# Patient Record
Sex: Female | Born: 1940 | Race: White | Hispanic: No | Marital: Married | Smoking: Never smoker
Health system: Southern US, Community
[De-identification: ages and names within clinical notes are randomized; demographics above are authoritative.]

## PROBLEM LIST (undated history)

## (undated) DIAGNOSIS — F329 Major depressive disorder, single episode, unspecified: Secondary | ICD-10-CM

## (undated) DIAGNOSIS — F32A Depression, unspecified: Secondary | ICD-10-CM

## (undated) DIAGNOSIS — E7800 Pure hypercholesterolemia, unspecified: Secondary | ICD-10-CM

## (undated) DIAGNOSIS — I10 Essential (primary) hypertension: Secondary | ICD-10-CM

## (undated) HISTORY — PX: SALPINGOOPHORECTOMY: SHX82

## (undated) HISTORY — PX: TONSILLECTOMY: SUR1361

## (undated) HISTORY — PX: CERVICAL LAMINECTOMY: SHX94

## (undated) HISTORY — PX: ABDOMINAL HYSTERECTOMY: SHX81

## (undated) HISTORY — PX: KIDNEY STONE SURGERY: SHX686

---

## 1997-11-16 ENCOUNTER — Other Ambulatory Visit: Admission: RE | Admit: 1997-11-16 | Discharge: 1997-11-16 | Payer: Self-pay | Admitting: Obstetrics and Gynecology

## 1998-12-23 ENCOUNTER — Ambulatory Visit (HOSPITAL_COMMUNITY): Admission: RE | Admit: 1998-12-23 | Discharge: 1998-12-23 | Payer: Self-pay | Admitting: Obstetrics and Gynecology

## 1998-12-23 ENCOUNTER — Encounter: Payer: Self-pay | Admitting: Obstetrics and Gynecology

## 1999-01-11 ENCOUNTER — Other Ambulatory Visit: Admission: RE | Admit: 1999-01-11 | Discharge: 1999-01-11 | Payer: Self-pay | Admitting: Obstetrics and Gynecology

## 2000-06-21 ENCOUNTER — Other Ambulatory Visit: Admission: RE | Admit: 2000-06-21 | Discharge: 2000-06-21 | Payer: Self-pay | Admitting: Obstetrics and Gynecology

## 2002-02-27 ENCOUNTER — Ambulatory Visit (HOSPITAL_COMMUNITY): Admission: RE | Admit: 2002-02-27 | Discharge: 2002-02-27 | Payer: Self-pay | Admitting: Obstetrics and Gynecology

## 2002-02-27 ENCOUNTER — Encounter: Payer: Self-pay | Admitting: Obstetrics and Gynecology

## 2003-10-01 ENCOUNTER — Ambulatory Visit (HOSPITAL_COMMUNITY): Admission: RE | Admit: 2003-10-01 | Discharge: 2003-10-01 | Payer: Self-pay | Admitting: Obstetrics and Gynecology

## 2004-11-06 ENCOUNTER — Encounter: Admission: RE | Admit: 2004-11-06 | Discharge: 2004-11-06 | Payer: Self-pay | Admitting: Orthopedic Surgery

## 2007-05-22 ENCOUNTER — Ambulatory Visit (HOSPITAL_COMMUNITY): Admission: RE | Admit: 2007-05-22 | Discharge: 2007-05-22 | Payer: Self-pay | Admitting: Internal Medicine

## 2008-10-26 ENCOUNTER — Ambulatory Visit (HOSPITAL_COMMUNITY): Admission: RE | Admit: 2008-10-26 | Discharge: 2008-10-26 | Payer: Self-pay | Admitting: Internal Medicine

## 2011-03-15 ENCOUNTER — Other Ambulatory Visit (HOSPITAL_COMMUNITY): Payer: Self-pay | Admitting: Internal Medicine

## 2011-03-15 DIAGNOSIS — Z1231 Encounter for screening mammogram for malignant neoplasm of breast: Secondary | ICD-10-CM

## 2011-03-16 ENCOUNTER — Ambulatory Visit (HOSPITAL_COMMUNITY)
Admission: RE | Admit: 2011-03-16 | Discharge: 2011-03-16 | Disposition: A | Discharge status: Home / Self Care | Payer: MEDICARE | Source: Ambulatory Visit | Attending: Internal Medicine | Admitting: Internal Medicine

## 2011-03-16 DIAGNOSIS — Z1231 Encounter for screening mammogram for malignant neoplasm of breast: Secondary | ICD-10-CM | POA: Insufficient documentation

## 2011-12-19 ENCOUNTER — Encounter: Payer: Self-pay | Admitting: Internal Medicine

## 2012-03-08 ENCOUNTER — Encounter (HOSPITAL_BASED_OUTPATIENT_CLINIC_OR_DEPARTMENT_OTHER): Payer: Self-pay | Admitting: *Deleted

## 2012-03-08 ENCOUNTER — Emergency Department (HOSPITAL_BASED_OUTPATIENT_CLINIC_OR_DEPARTMENT_OTHER): Payer: MEDICARE

## 2012-03-08 ENCOUNTER — Emergency Department (HOSPITAL_BASED_OUTPATIENT_CLINIC_OR_DEPARTMENT_OTHER)
Admission: EM | Admit: 2012-03-08 | Discharge: 2012-03-08 | Disposition: A | Discharge status: Home / Self Care | Payer: MEDICARE | Attending: Emergency Medicine | Admitting: Emergency Medicine

## 2012-03-08 DIAGNOSIS — E7800 Pure hypercholesterolemia, unspecified: Secondary | ICD-10-CM | POA: Insufficient documentation

## 2012-03-08 DIAGNOSIS — F3289 Other specified depressive episodes: Secondary | ICD-10-CM | POA: Insufficient documentation

## 2012-03-08 DIAGNOSIS — F329 Major depressive disorder, single episode, unspecified: Secondary | ICD-10-CM | POA: Insufficient documentation

## 2012-03-08 DIAGNOSIS — I10 Essential (primary) hypertension: Secondary | ICD-10-CM | POA: Insufficient documentation

## 2012-03-08 DIAGNOSIS — IMO0001 Reserved for inherently not codable concepts without codable children: Secondary | ICD-10-CM | POA: Insufficient documentation

## 2012-03-08 DIAGNOSIS — Z79899 Other long term (current) drug therapy: Secondary | ICD-10-CM | POA: Insufficient documentation

## 2012-03-08 DIAGNOSIS — M7660 Achilles tendinitis, unspecified leg: Secondary | ICD-10-CM | POA: Insufficient documentation

## 2012-03-08 HISTORY — DX: Essential (primary) hypertension: I10

## 2012-03-08 HISTORY — DX: Major depressive disorder, single episode, unspecified: F32.9

## 2012-03-08 HISTORY — DX: Depression, unspecified: F32.A

## 2012-03-08 HISTORY — DX: Pure hypercholesterolemia, unspecified: E78.00

## 2012-03-08 MED ORDER — HYDROCODONE-ACETAMINOPHEN 5-325 MG PO TABS: 2.0000 | ORAL_TABLET | ORAL | Status: AC | PRN

## 2012-03-08 NOTE — ED Notes (Signed)
Patient with left foot pain since Wednesday.  Denies any injury to the foot.  Pain is located on her left heel area and radiates to her ankle and above the ankle.

## 2012-03-08 NOTE — ED Provider Notes (Signed)
History     CSN: [id number]  Arrival date & time 03/08/12  1137   First MD Initiated Contact with Patient 03/08/12 1237      Chief Complaint  Patient presents with  . Foot Pain    (Consider location/radiation/quality/duration/timing/severity/associated sxs/prior treatment) Patient is a 71 y.o. female presenting with ankle pain. The history is provided by the patient. No language interpreter was used.  Ankle Pain  Incident onset: 3 days. There was no injury mechanism. The pain is present in the left ankle (achilles area). The quality of the pain is described as aching. The pain is at a severity of 0/10. The pain is moderate. The pain has been constant since onset. She reports no foreign bodies present. She has tried nothing for the symptoms.  Pt complains of pain to her left ankle.  Pt has been doing a lot of walking recently and cleaned up leaves in the yard  Past Medical History  Diagnosis Date  . Hypercholesteremia   . Hypertension   . Depression     History reviewed. No pertinent past surgical history.  History reviewed. No pertinent family history.  History  Substance Use Topics  . Smoking status: Never Smoker   . Smokeless tobacco: Not on file  . Alcohol Use: No    OB History    Grav Para Term Preterm Abortions TAB SAB Ect Mult Living                  Review of Systems  Musculoskeletal: Positive for myalgias. Negative for joint swelling.  Skin: Negative for wound.  All other systems reviewed and are negative.    Allergies  Review of patient's allergies indicates no known allergies.  Home Medications   Current Outpatient Rx  Name  Route  Sig  Dispense  Refill  . HYDROCHLOROTHIAZIDE 25 MG PO TABS   Oral   Take 25 mg by mouth daily.         Marland Kitchen LEVOTHYROXINE SODIUM 100 MCG PO TABS   Oral   Take 100 mcg by mouth daily.         . SERTRALINE HCL 50 MG PO TABS   Oral   Take 50 mg by mouth daily.         Marland Kitchen SIMVASTATIN 40 MG PO TABS   Oral  Take 40 mg by mouth every evening.           BP 136/71  Pulse 76  Temp 98.5 F (36.9 C) (Oral)  Resp 18  Ht 5\' 3"  (1.6 m)  Wt 172 lb (78.019 kg)  BMI 30.47 kg/m2  SpO2 98%  Physical Exam  Nursing note and vitals reviewed. Constitutional: She is oriented to person, place, and time. She appears well-developed and well-nourished.  HENT:  Head: Normocephalic and atraumatic.  Eyes: Pupils are equal, round, and reactive to light.  Cardiovascular: Normal rate.   Musculoskeletal: She exhibits tenderness.  Neurological: She is alert and oriented to person, place, and time. She has normal reflexes.  Skin: Skin is warm.  Psychiatric: She has a normal mood and affect.    ED Course  Procedures (including critical care time)  Labs Reviewed - No data to display No results found.   1. Achilles tendonitis     No results found for this or any previous visit. Dg Ankle Complete Left  03/08/2012  *RADIOLOGY REPORT*  Clinical Data: Left ankle pain for 3 days  LEFT ANKLE COMPLETE - 3+ VIEW  Comparison: None.  Findings:  Three views of the left ankle submitted.  No acute fracture or subluxation.  Mild soft tissue swelling adjacent to lateral malleolus.  Minimal posterior spurring of the calcaneus. The ankle mortise is preserved.  IMPRESSION: No acute fracture or subluxation.   Original Report Authenticated By: [name], M.D.      MDM   Pt placed in an aso. Pt advised tylenol  Elevate foot,  Decrease activitiy  Pt advised to follow up with Dr. Pearletha Forge for recheck if pain persist past one week.       Aimee Brown [place], [place] 03/08/12 1317  Aimee Brown [place], [place] 03/08/12 1326  Aimee Brown [place], [place] 03/08/12 1327

## 2012-03-08 NOTE — ED Provider Notes (Signed)
Medical screening examination/treatment/procedure(s) were conducted as a shared visit with non-physician practitioner(s) and myself.  I personally evaluated the patient during the encounter  Doug Sou, MD 03/08/12 1700

## 2012-03-08 NOTE — ED Provider Notes (Signed)
Complains of left foot pain and pain overlying left Achilles tendon onset 3 days ago. On exam patient alert nontoxic left lower extremity no redness or swelling negative Thompson's test DP pulse 2+ tender overlying the Achilles tendon  Doug Sou, MD 03/08/12 1343

## 2012-10-15 ENCOUNTER — Encounter (INDEPENDENT_AMBULATORY_CARE_PROVIDER_SITE_OTHER): Payer: MEDICARE | Admitting: *Deleted

## 2012-10-15 DIAGNOSIS — I6529 Occlusion and stenosis of unspecified carotid artery: Secondary | ICD-10-CM

## 2012-10-15 DIAGNOSIS — G458 Other transient cerebral ischemic attacks and related syndromes: Secondary | ICD-10-CM

## 2012-11-07 ENCOUNTER — Telehealth: Payer: Self-pay | Admitting: Diagnostic Neuroimaging

## 2012-11-11 ENCOUNTER — Encounter (INDEPENDENT_AMBULATORY_CARE_PROVIDER_SITE_OTHER): Payer: Self-pay | Admitting: Diagnostic Neuroimaging

## 2012-11-11 ENCOUNTER — Ambulatory Visit (INDEPENDENT_AMBULATORY_CARE_PROVIDER_SITE_OTHER): Payer: Medicare Other

## 2012-11-11 DIAGNOSIS — R209 Unspecified disturbances of skin sensation: Secondary | ICD-10-CM

## 2012-11-11 DIAGNOSIS — Z0289 Encounter for other administrative examinations: Secondary | ICD-10-CM

## 2012-11-21 NOTE — Procedures (Signed)
   [facility]  NCS (NERVE CONDUCTION STUDY) WITH EMG (ELECTROMYOGRAPHY) REPORT   STUDY DATE: 11/11/12 PATIENT NAME: Aimee Brown DOB: 1941/02/13 MRN: [id number]  ORDERING CLINICIAN: Jacky Kindle  TECHNOLOGIST: Gearldine Shown ELECTROMYOGRAPHER: Glenford Bayley. Penumalli, MD  CLINICAL INFORMATION: 72 year old female with numbness and tingling in the left arm x 2 years. Symptoms radiate from left shoulder to the left hand and fingers. Symptoms are intermittent, but worse with exertion. Patient denies any neck pain.  FINDINGS: NERVE CONDUCTION STUDY: Bilateral median and ulnar motor responses have normal distal latencies, amplitudes, conduction velocities and F-wave latencies. Bilateral median and ulnar sensory responses are normal.  NEEDLE ELECTROMYOGRAPHY: Needle examination of selected muscles of left upper extremity (deltoid, biceps, triceps, extensor indicis proprius, brachial radialis, flexor carpi radialis, first dorsal interosseous) shows no abnormal spontaneous activity at rest and decreased motor unit recruitment in the left triceps muscle on exertion. Examination of left C5-6, C6-7, C7-T1 paraspinal muscles shows rare complex repetitive discharges and fibrillation potentials in the left C6-7 region.  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 No evidence of brachial plexopathy or neuropathy at this time.   INTERPRETING PHYSICIAN:  Suanne Marker, MD Certified in Neurology, Neurophysiology and Neuroimaging  [facility] [address] [place], [place] [address] [phone]

## 2013-02-23 ENCOUNTER — Other Ambulatory Visit (HOSPITAL_COMMUNITY): Payer: Self-pay | Admitting: Internal Medicine

## 2013-02-23 DIAGNOSIS — Z1231 Encounter for screening mammogram for malignant neoplasm of breast: Secondary | ICD-10-CM

## 2013-03-10 ENCOUNTER — Ambulatory Visit (HOSPITAL_COMMUNITY)
Admission: RE | Admit: 2013-03-10 | Discharge: 2013-03-10 | Disposition: A | Discharge status: Home / Self Care | Payer: MEDICARE | Source: Ambulatory Visit | Attending: Internal Medicine | Admitting: Internal Medicine

## 2013-03-10 DIAGNOSIS — Z1231 Encounter for screening mammogram for malignant neoplasm of breast: Secondary | ICD-10-CM | POA: Insufficient documentation

## 2013-07-23 NOTE — Telephone Encounter (Signed)
Closing encounter

## 2015-02-08 ENCOUNTER — Other Ambulatory Visit: Payer: Self-pay

## 2015-02-08 DIAGNOSIS — Z1231 Encounter for screening mammogram for malignant neoplasm of breast: Secondary | ICD-10-CM

## 2015-03-02 ENCOUNTER — Ambulatory Visit
Admission: RE | Admit: 2015-03-02 | Discharge: 2015-03-02 | Disposition: A | Discharge status: Home / Self Care | Payer: Medicare Other | Source: Ambulatory Visit

## 2015-03-02 DIAGNOSIS — Z1231 Encounter for screening mammogram for malignant neoplasm of breast: Secondary | ICD-10-CM

## 2016-04-03 DIAGNOSIS — H52202 Unspecified astigmatism, left eye: Secondary | ICD-10-CM | POA: Diagnosis not present

## 2016-04-03 DIAGNOSIS — H26491 Other secondary cataract, right eye: Secondary | ICD-10-CM | POA: Diagnosis not present

## 2016-04-03 DIAGNOSIS — H5202 Hypermetropia, left eye: Secondary | ICD-10-CM | POA: Diagnosis not present

## 2016-04-03 DIAGNOSIS — L570 Actinic keratosis: Secondary | ICD-10-CM | POA: Diagnosis not present

## 2016-04-03 DIAGNOSIS — Z961 Presence of intraocular lens: Secondary | ICD-10-CM | POA: Diagnosis not present

## 2016-04-03 DIAGNOSIS — H524 Presbyopia: Secondary | ICD-10-CM | POA: Diagnosis not present

## 2016-04-03 DIAGNOSIS — C44729 Squamous cell carcinoma of skin of left lower limb, including hip: Secondary | ICD-10-CM | POA: Diagnosis not present

## 2016-04-17 ENCOUNTER — Other Ambulatory Visit: Payer: Self-pay | Admitting: Internal Medicine

## 2016-04-17 DIAGNOSIS — Z1231 Encounter for screening mammogram for malignant neoplasm of breast: Secondary | ICD-10-CM

## 2016-04-23 DIAGNOSIS — C44729 Squamous cell carcinoma of skin of left lower limb, including hip: Secondary | ICD-10-CM | POA: Diagnosis not present

## 2016-04-26 DIAGNOSIS — I10 Essential (primary) hypertension: Secondary | ICD-10-CM | POA: Diagnosis not present

## 2016-04-26 DIAGNOSIS — E038 Other specified hypothyroidism: Secondary | ICD-10-CM | POA: Diagnosis not present

## 2016-04-26 DIAGNOSIS — R682 Dry mouth, unspecified: Secondary | ICD-10-CM | POA: Diagnosis not present

## 2016-04-26 DIAGNOSIS — Z6831 Body mass index (BMI) 31.0-31.9, adult: Secondary | ICD-10-CM | POA: Diagnosis not present

## 2016-04-30 DIAGNOSIS — Z961 Presence of intraocular lens: Secondary | ICD-10-CM | POA: Diagnosis not present

## 2016-04-30 DIAGNOSIS — H26491 Other secondary cataract, right eye: Secondary | ICD-10-CM | POA: Diagnosis not present

## 2016-05-07 DIAGNOSIS — S81802D Unspecified open wound, left lower leg, subsequent encounter: Secondary | ICD-10-CM | POA: Diagnosis not present

## 2016-05-08 DIAGNOSIS — R682 Dry mouth, unspecified: Secondary | ICD-10-CM | POA: Diagnosis not present

## 2016-05-08 DIAGNOSIS — Z9622 Myringotomy tube(s) status: Secondary | ICD-10-CM | POA: Diagnosis not present

## 2016-05-09 DIAGNOSIS — H26491 Other secondary cataract, right eye: Secondary | ICD-10-CM | POA: Diagnosis not present

## 2016-05-14 ENCOUNTER — Ambulatory Visit
Admission: RE | Admit: 2016-05-14 | Discharge: 2016-05-14 | Disposition: A | Discharge status: Home / Self Care | Payer: PPO | Source: Ambulatory Visit | Attending: Internal Medicine | Admitting: Internal Medicine

## 2016-05-14 DIAGNOSIS — Z1231 Encounter for screening mammogram for malignant neoplasm of breast: Secondary | ICD-10-CM

## 2016-05-14 DIAGNOSIS — S81802D Unspecified open wound, left lower leg, subsequent encounter: Secondary | ICD-10-CM | POA: Diagnosis not present

## 2016-05-21 DIAGNOSIS — S81802D Unspecified open wound, left lower leg, subsequent encounter: Secondary | ICD-10-CM | POA: Diagnosis not present

## 2016-05-21 DIAGNOSIS — Z4801 Encounter for change or removal of surgical wound dressing: Secondary | ICD-10-CM | POA: Diagnosis not present

## 2016-05-28 DIAGNOSIS — I10 Essential (primary) hypertension: Secondary | ICD-10-CM | POA: Diagnosis not present

## 2016-05-28 DIAGNOSIS — E784 Other hyperlipidemia: Secondary | ICD-10-CM | POA: Diagnosis not present

## 2016-05-28 DIAGNOSIS — N390 Urinary tract infection, site not specified: Secondary | ICD-10-CM | POA: Diagnosis not present

## 2016-05-28 DIAGNOSIS — C44729 Squamous cell carcinoma of skin of left lower limb, including hip: Secondary | ICD-10-CM | POA: Diagnosis not present

## 2016-05-28 DIAGNOSIS — S81802D Unspecified open wound, left lower leg, subsequent encounter: Secondary | ICD-10-CM | POA: Diagnosis not present

## 2016-05-28 DIAGNOSIS — D485 Neoplasm of uncertain behavior of skin: Secondary | ICD-10-CM | POA: Diagnosis not present

## 2016-05-28 DIAGNOSIS — R8299 Other abnormal findings in urine: Secondary | ICD-10-CM | POA: Diagnosis not present

## 2016-06-04 DIAGNOSIS — E038 Other specified hypothyroidism: Secondary | ICD-10-CM | POA: Diagnosis not present

## 2016-06-04 DIAGNOSIS — S81802D Unspecified open wound, left lower leg, subsequent encounter: Secondary | ICD-10-CM | POA: Diagnosis not present

## 2016-06-04 DIAGNOSIS — I10 Essential (primary) hypertension: Secondary | ICD-10-CM | POA: Diagnosis not present

## 2016-06-04 DIAGNOSIS — K219 Gastro-esophageal reflux disease without esophagitis: Secondary | ICD-10-CM | POA: Diagnosis not present

## 2016-06-04 DIAGNOSIS — R682 Dry mouth, unspecified: Secondary | ICD-10-CM | POA: Diagnosis not present

## 2016-06-04 DIAGNOSIS — Z6831 Body mass index (BMI) 31.0-31.9, adult: Secondary | ICD-10-CM | POA: Diagnosis not present

## 2016-06-04 DIAGNOSIS — Z1389 Encounter for screening for other disorder: Secondary | ICD-10-CM | POA: Diagnosis not present

## 2016-06-04 DIAGNOSIS — Z0000 Encounter for general adult medical examination without abnormal findings: Secondary | ICD-10-CM | POA: Diagnosis not present

## 2016-06-04 DIAGNOSIS — E784 Other hyperlipidemia: Secondary | ICD-10-CM | POA: Diagnosis not present

## 2016-06-04 DIAGNOSIS — E669 Obesity, unspecified: Secondary | ICD-10-CM | POA: Diagnosis not present

## 2016-06-04 DIAGNOSIS — M5412 Radiculopathy, cervical region: Secondary | ICD-10-CM | POA: Diagnosis not present

## 2016-06-04 DIAGNOSIS — K589 Irritable bowel syndrome without diarrhea: Secondary | ICD-10-CM | POA: Diagnosis not present

## 2016-06-04 DIAGNOSIS — M1990 Unspecified osteoarthritis, unspecified site: Secondary | ICD-10-CM | POA: Diagnosis not present

## 2016-06-11 DIAGNOSIS — S81802D Unspecified open wound, left lower leg, subsequent encounter: Secondary | ICD-10-CM | POA: Diagnosis not present

## 2016-06-12 DIAGNOSIS — E669 Obesity, unspecified: Secondary | ICD-10-CM | POA: Diagnosis not present

## 2016-06-12 DIAGNOSIS — Z6831 Body mass index (BMI) 31.0-31.9, adult: Secondary | ICD-10-CM | POA: Diagnosis not present

## 2016-06-12 DIAGNOSIS — M3500 Sicca syndrome, unspecified: Secondary | ICD-10-CM | POA: Diagnosis not present

## 2016-06-14 DIAGNOSIS — Z1212 Encounter for screening for malignant neoplasm of rectum: Secondary | ICD-10-CM | POA: Diagnosis not present

## 2016-06-20 DIAGNOSIS — L820 Inflamed seborrheic keratosis: Secondary | ICD-10-CM | POA: Diagnosis not present

## 2016-06-20 DIAGNOSIS — L814 Other melanin hyperpigmentation: Secondary | ICD-10-CM | POA: Diagnosis not present

## 2016-06-20 DIAGNOSIS — D225 Melanocytic nevi of trunk: Secondary | ICD-10-CM | POA: Diagnosis not present

## 2016-06-20 DIAGNOSIS — L578 Other skin changes due to chronic exposure to nonionizing radiation: Secondary | ICD-10-CM | POA: Diagnosis not present

## 2016-06-25 DIAGNOSIS — S81802D Unspecified open wound, left lower leg, subsequent encounter: Secondary | ICD-10-CM | POA: Diagnosis not present

## 2016-07-11 DIAGNOSIS — E669 Obesity, unspecified: Secondary | ICD-10-CM | POA: Diagnosis not present

## 2016-07-11 DIAGNOSIS — M150 Primary generalized (osteo)arthritis: Secondary | ICD-10-CM | POA: Diagnosis not present

## 2016-07-11 DIAGNOSIS — I7300 Raynaud's syndrome without gangrene: Secondary | ICD-10-CM | POA: Diagnosis not present

## 2016-07-11 DIAGNOSIS — M3500 Sicca syndrome, unspecified: Secondary | ICD-10-CM | POA: Diagnosis not present

## 2016-07-11 DIAGNOSIS — Z6830 Body mass index (BMI) 30.0-30.9, adult: Secondary | ICD-10-CM | POA: Diagnosis not present

## 2016-07-16 DIAGNOSIS — L570 Actinic keratosis: Secondary | ICD-10-CM | POA: Diagnosis not present

## 2016-07-16 DIAGNOSIS — Z08 Encounter for follow-up examination after completed treatment for malignant neoplasm: Secondary | ICD-10-CM | POA: Diagnosis not present

## 2016-08-20 DIAGNOSIS — C44729 Squamous cell carcinoma of skin of left lower limb, including hip: Secondary | ICD-10-CM | POA: Diagnosis not present

## 2016-08-20 DIAGNOSIS — Z85828 Personal history of other malignant neoplasm of skin: Secondary | ICD-10-CM | POA: Diagnosis not present

## 2016-08-20 DIAGNOSIS — L905 Scar conditions and fibrosis of skin: Secondary | ICD-10-CM | POA: Diagnosis not present

## 2016-08-20 DIAGNOSIS — L570 Actinic keratosis: Secondary | ICD-10-CM | POA: Diagnosis not present

## 2016-08-23 DIAGNOSIS — H00022 Hordeolum internum right lower eyelid: Secondary | ICD-10-CM | POA: Diagnosis not present

## 2016-09-18 ENCOUNTER — Other Ambulatory Visit: Payer: Self-pay | Admitting: *Deleted

## 2016-09-18 NOTE — Patient Outreach (Signed)
[facility]) Care Management  09/18/2016  Aimee Brown 1940/06/25 [id number]   Member identified as high risk through [place].  Call placed to member, identity verified.  She report that she does have history of hypertension, but it is controlled.  She has not been in hospital or gone to the ED in the last 6 months.  She denies any concern regarding her healthcare management at this time, declines need for assistance.  Will not place referral, will not open case.  Advised to discuss potential involvement with [facility] with primary MD in the future if needs change.  Valente David, [place], MSN [place] [phone]

## 2016-09-27 ENCOUNTER — Encounter: Payer: Self-pay | Admitting: *Deleted

## 2016-10-01 DIAGNOSIS — L905 Scar conditions and fibrosis of skin: Secondary | ICD-10-CM | POA: Diagnosis not present

## 2016-10-01 DIAGNOSIS — Z85828 Personal history of other malignant neoplasm of skin: Secondary | ICD-10-CM | POA: Diagnosis not present

## 2016-10-01 DIAGNOSIS — L570 Actinic keratosis: Secondary | ICD-10-CM | POA: Diagnosis not present

## 2016-10-29 DIAGNOSIS — J340 Abscess, furuncle and carbuncle of nose: Secondary | ICD-10-CM | POA: Diagnosis not present

## 2016-10-29 DIAGNOSIS — Z9622 Myringotomy tube(s) status: Secondary | ICD-10-CM | POA: Diagnosis not present

## 2016-10-29 DIAGNOSIS — H6522 Chronic serous otitis media, left ear: Secondary | ICD-10-CM | POA: Diagnosis not present

## 2016-11-07 DIAGNOSIS — I7300 Raynaud's syndrome without gangrene: Secondary | ICD-10-CM | POA: Diagnosis not present

## 2016-11-07 DIAGNOSIS — M150 Primary generalized (osteo)arthritis: Secondary | ICD-10-CM | POA: Diagnosis not present

## 2016-11-07 DIAGNOSIS — E669 Obesity, unspecified: Secondary | ICD-10-CM | POA: Diagnosis not present

## 2016-11-07 DIAGNOSIS — Z6830 Body mass index (BMI) 30.0-30.9, adult: Secondary | ICD-10-CM | POA: Diagnosis not present

## 2016-11-07 DIAGNOSIS — M3500 Sicca syndrome, unspecified: Secondary | ICD-10-CM | POA: Diagnosis not present

## 2016-12-10 DIAGNOSIS — M1990 Unspecified osteoarthritis, unspecified site: Secondary | ICD-10-CM | POA: Diagnosis not present

## 2016-12-10 DIAGNOSIS — E784 Other hyperlipidemia: Secondary | ICD-10-CM | POA: Diagnosis not present

## 2016-12-10 DIAGNOSIS — Z6831 Body mass index (BMI) 31.0-31.9, adult: Secondary | ICD-10-CM | POA: Diagnosis not present

## 2016-12-10 DIAGNOSIS — M3500 Sicca syndrome, unspecified: Secondary | ICD-10-CM | POA: Diagnosis not present

## 2016-12-10 DIAGNOSIS — K589 Irritable bowel syndrome without diarrhea: Secondary | ICD-10-CM | POA: Diagnosis not present

## 2016-12-10 DIAGNOSIS — M109 Gout, unspecified: Secondary | ICD-10-CM | POA: Diagnosis not present

## 2016-12-10 DIAGNOSIS — E038 Other specified hypothyroidism: Secondary | ICD-10-CM | POA: Diagnosis not present

## 2016-12-10 DIAGNOSIS — M7122 Synovial cyst of popliteal space [Baker], left knee: Secondary | ICD-10-CM | POA: Diagnosis not present

## 2016-12-10 DIAGNOSIS — E669 Obesity, unspecified: Secondary | ICD-10-CM | POA: Diagnosis not present

## 2016-12-10 DIAGNOSIS — I10 Essential (primary) hypertension: Secondary | ICD-10-CM | POA: Diagnosis not present

## 2016-12-10 DIAGNOSIS — Z23 Encounter for immunization: Secondary | ICD-10-CM | POA: Diagnosis not present

## 2016-12-10 DIAGNOSIS — M5412 Radiculopathy, cervical region: Secondary | ICD-10-CM | POA: Diagnosis not present

## 2016-12-31 DIAGNOSIS — L905 Scar conditions and fibrosis of skin: Secondary | ICD-10-CM | POA: Diagnosis not present

## 2016-12-31 DIAGNOSIS — Z85828 Personal history of other malignant neoplasm of skin: Secondary | ICD-10-CM | POA: Diagnosis not present

## 2016-12-31 DIAGNOSIS — L570 Actinic keratosis: Secondary | ICD-10-CM | POA: Diagnosis not present

## 2016-12-31 DIAGNOSIS — C44729 Squamous cell carcinoma of skin of left lower limb, including hip: Secondary | ICD-10-CM | POA: Diagnosis not present

## 2017-03-01 DIAGNOSIS — E669 Obesity, unspecified: Secondary | ICD-10-CM | POA: Diagnosis not present

## 2017-03-01 DIAGNOSIS — R591 Generalized enlarged lymph nodes: Secondary | ICD-10-CM | POA: Diagnosis not present

## 2017-03-01 DIAGNOSIS — I10 Essential (primary) hypertension: Secondary | ICD-10-CM | POA: Diagnosis not present

## 2017-03-01 DIAGNOSIS — R221 Localized swelling, mass and lump, neck: Secondary | ICD-10-CM | POA: Diagnosis not present

## 2017-03-13 DIAGNOSIS — M150 Primary generalized (osteo)arthritis: Secondary | ICD-10-CM | POA: Diagnosis not present

## 2017-03-13 DIAGNOSIS — I7300 Raynaud's syndrome without gangrene: Secondary | ICD-10-CM | POA: Diagnosis not present

## 2017-03-13 DIAGNOSIS — M3500 Sicca syndrome, unspecified: Secondary | ICD-10-CM | POA: Diagnosis not present

## 2017-03-13 DIAGNOSIS — Z6830 Body mass index (BMI) 30.0-30.9, adult: Secondary | ICD-10-CM | POA: Diagnosis not present

## 2017-03-13 DIAGNOSIS — E669 Obesity, unspecified: Secondary | ICD-10-CM | POA: Diagnosis not present

## 2017-04-09 DIAGNOSIS — Z85828 Personal history of other malignant neoplasm of skin: Secondary | ICD-10-CM | POA: Diagnosis not present

## 2017-04-09 DIAGNOSIS — L570 Actinic keratosis: Secondary | ICD-10-CM | POA: Diagnosis not present

## 2017-04-09 DIAGNOSIS — B078 Other viral warts: Secondary | ICD-10-CM | POA: Diagnosis not present

## 2017-04-09 DIAGNOSIS — L905 Scar conditions and fibrosis of skin: Secondary | ICD-10-CM | POA: Diagnosis not present

## 2017-04-09 DIAGNOSIS — B079 Viral wart, unspecified: Secondary | ICD-10-CM | POA: Diagnosis not present

## 2017-04-09 DIAGNOSIS — L820 Inflamed seborrheic keratosis: Secondary | ICD-10-CM | POA: Diagnosis not present

## 2017-04-15 DIAGNOSIS — H524 Presbyopia: Secondary | ICD-10-CM | POA: Diagnosis not present

## 2017-04-15 DIAGNOSIS — Z961 Presence of intraocular lens: Secondary | ICD-10-CM | POA: Diagnosis not present

## 2017-04-15 DIAGNOSIS — H04123 Dry eye syndrome of bilateral lacrimal glands: Secondary | ICD-10-CM | POA: Diagnosis not present

## 2017-05-01 ENCOUNTER — Other Ambulatory Visit: Payer: Self-pay | Admitting: Internal Medicine

## 2017-05-01 DIAGNOSIS — Z1231 Encounter for screening mammogram for malignant neoplasm of breast: Secondary | ICD-10-CM

## 2017-05-20 ENCOUNTER — Ambulatory Visit
Admission: RE | Admit: 2017-05-20 | Discharge: 2017-05-20 | Disposition: A | Discharge status: Home / Self Care | Payer: PPO | Source: Ambulatory Visit | Attending: Internal Medicine | Admitting: Internal Medicine

## 2017-05-20 DIAGNOSIS — Z1231 Encounter for screening mammogram for malignant neoplasm of breast: Secondary | ICD-10-CM

## 2017-06-05 DIAGNOSIS — R82998 Other abnormal findings in urine: Secondary | ICD-10-CM | POA: Diagnosis not present

## 2017-06-05 DIAGNOSIS — E038 Other specified hypothyroidism: Secondary | ICD-10-CM | POA: Diagnosis not present

## 2017-06-05 DIAGNOSIS — I10 Essential (primary) hypertension: Secondary | ICD-10-CM | POA: Diagnosis not present

## 2017-06-05 DIAGNOSIS — M109 Gout, unspecified: Secondary | ICD-10-CM | POA: Diagnosis not present

## 2017-06-12 DIAGNOSIS — I10 Essential (primary) hypertension: Secondary | ICD-10-CM | POA: Diagnosis not present

## 2017-06-12 DIAGNOSIS — Z1389 Encounter for screening for other disorder: Secondary | ICD-10-CM | POA: Diagnosis not present

## 2017-06-12 DIAGNOSIS — E7849 Other hyperlipidemia: Secondary | ICD-10-CM | POA: Diagnosis not present

## 2017-06-12 DIAGNOSIS — M3500 Sicca syndrome, unspecified: Secondary | ICD-10-CM | POA: Diagnosis not present

## 2017-06-12 DIAGNOSIS — E663 Overweight: Secondary | ICD-10-CM | POA: Diagnosis not present

## 2017-06-12 DIAGNOSIS — Z6829 Body mass index (BMI) 29.0-29.9, adult: Secondary | ICD-10-CM | POA: Diagnosis not present

## 2017-06-12 DIAGNOSIS — K589 Irritable bowel syndrome without diarrhea: Secondary | ICD-10-CM | POA: Diagnosis not present

## 2017-06-12 DIAGNOSIS — M109 Gout, unspecified: Secondary | ICD-10-CM | POA: Diagnosis not present

## 2017-06-12 DIAGNOSIS — E038 Other specified hypothyroidism: Secondary | ICD-10-CM | POA: Diagnosis not present

## 2017-06-12 DIAGNOSIS — Z0000 Encounter for general adult medical examination without abnormal findings: Secondary | ICD-10-CM | POA: Diagnosis not present

## 2017-06-12 DIAGNOSIS — R0609 Other forms of dyspnea: Secondary | ICD-10-CM | POA: Diagnosis not present

## 2017-06-19 DIAGNOSIS — R079 Chest pain, unspecified: Secondary | ICD-10-CM | POA: Diagnosis not present

## 2017-06-19 DIAGNOSIS — I119 Hypertensive heart disease without heart failure: Secondary | ICD-10-CM | POA: Diagnosis not present

## 2017-06-19 DIAGNOSIS — R0602 Shortness of breath: Secondary | ICD-10-CM | POA: Diagnosis not present

## 2017-06-19 DIAGNOSIS — E785 Hyperlipidemia, unspecified: Secondary | ICD-10-CM | POA: Diagnosis not present

## 2017-06-27 DIAGNOSIS — E039 Hypothyroidism, unspecified: Secondary | ICD-10-CM | POA: Diagnosis not present

## 2017-06-27 DIAGNOSIS — R0602 Shortness of breath: Secondary | ICD-10-CM | POA: Diagnosis not present

## 2017-06-27 DIAGNOSIS — E669 Obesity, unspecified: Secondary | ICD-10-CM | POA: Diagnosis not present

## 2017-06-27 DIAGNOSIS — E785 Hyperlipidemia, unspecified: Secondary | ICD-10-CM | POA: Diagnosis not present

## 2017-06-27 DIAGNOSIS — I119 Hypertensive heart disease without heart failure: Secondary | ICD-10-CM | POA: Diagnosis not present

## 2017-06-28 DIAGNOSIS — R0609 Other forms of dyspnea: Secondary | ICD-10-CM | POA: Diagnosis not present

## 2017-07-08 DIAGNOSIS — L570 Actinic keratosis: Secondary | ICD-10-CM | POA: Diagnosis not present

## 2017-07-08 DIAGNOSIS — Z85828 Personal history of other malignant neoplasm of skin: Secondary | ICD-10-CM | POA: Diagnosis not present

## 2017-07-08 DIAGNOSIS — L905 Scar conditions and fibrosis of skin: Secondary | ICD-10-CM | POA: Diagnosis not present

## 2017-07-09 ENCOUNTER — Other Ambulatory Visit: Payer: Self-pay | Admitting: Cardiology

## 2017-07-09 ENCOUNTER — Encounter: Payer: Self-pay | Admitting: Cardiology

## 2017-07-09 DIAGNOSIS — R0609 Other forms of dyspnea: Secondary | ICD-10-CM | POA: Diagnosis not present

## 2017-07-09 DIAGNOSIS — R0602 Shortness of breath: Secondary | ICD-10-CM

## 2017-07-09 DIAGNOSIS — E785 Hyperlipidemia, unspecified: Secondary | ICD-10-CM | POA: Diagnosis not present

## 2017-07-09 DIAGNOSIS — I119 Hypertensive heart disease without heart failure: Secondary | ICD-10-CM | POA: Insufficient documentation

## 2017-07-09 DIAGNOSIS — E7800 Pure hypercholesterolemia, unspecified: Secondary | ICD-10-CM

## 2017-07-09 DIAGNOSIS — R0600 Dyspnea, unspecified: Secondary | ICD-10-CM | POA: Insufficient documentation

## 2017-07-09 DIAGNOSIS — E669 Obesity, unspecified: Secondary | ICD-10-CM | POA: Diagnosis not present

## 2017-07-09 DIAGNOSIS — R9439 Abnormal result of other cardiovascular function study: Secondary | ICD-10-CM

## 2017-07-09 DIAGNOSIS — E039 Hypothyroidism, unspecified: Secondary | ICD-10-CM | POA: Diagnosis not present

## 2017-07-09 NOTE — H&P (View-Only) (Signed)
Aimee Brown  Date of visit:  07/09/2017 DOB:  [birth year]    Age:  77 yrs. Medical record number:  [id number]     Account number:  [id number] Primary Care Provider: Burnard Bunting A ____________________________ CURRENT DIAGNOSES  1. Dyspnea  2. Hypertensive heart disease without heart failure  3. Hyperlipidemia  4. Hypothyroidism  5. Obesity ____________________________ ALLERGIES  NKDA ____________________________ MEDICATIONS  1. Colcrys 0.6 mg tablet, 1 p.o. daily PRN  2. Imodium A-D 2 mg tablet, PRN  3. levothyroxine 112 mcg tablet, 1 p.o. daily  4. omeprazole 20 mg capsule,delayed release, 1 p.o. daily  5. ramipril 10 mg capsule, 1 p.o. daily  6. sertraline 50 mg tablet, 1 tablet p.o. daily  7. Vitamin D3 2,000 unit tablet, 1 p.o. daily ____________________________ HISTORY OF PRESENT ILLNESS This 77 year old female is brought in for elective catheterization following workup for dyspnea and an abnormal stress test.. The patient has been evaluated by me over the years for chest pain as well as dyspnea with negative stress test. She has previous evidence of diastolic dysfunction and mild LVH. She says her blood pressure is normally controlled at home but has been elevated in the office. She has noted exertional dyspnea walking short distances over the past 6 weeks. She previously had been relatively inactive but has difficulty walking up a hill or less than one quarter of a mile. Her husband noted she would stop and she will then be able to complete the walk. She may have some vague chest tightness at the time that she also walks. She denies PND, orthopnea or claudication. She has some mild edema present at the end of the day.  It is pertinent that she has had difficulty with dry mouth over the past year as well as some Raynaud's phenomena. She has mild arthritis. She reportedly had a chest x-ray last week that was unremarkable. She has seen a rheumatologist and no cause for her dry mouth  could be found. She previously had hyperlipidemia but stopped taking statins when she had the dry mouth diagnosed.  She was initially referred and had a myocardial perfusion scan on 4/11 and showed a reversible inferolateral defect of moderate size. She was only able to walk 5 minutes on the treadmill and had frequent ventricular ectopy and couplets with exercise no ST depression. Ejection fraction was 63%. Because of the abnormal myocardial perfusion scan and recent onset of symptoms is brought in for same-day elective catheterization. ____________________________ PAST HISTORY  Past Medical Illnesses:  hypertension, hypothyroidism, hyperlipidemia, obesity, irritable bowel syndrome, osteoarthritis, gout;  Cardiovascular Illnesses:  no previous history of cardiac disease.;  Surgical Procedures:  cesarean section, hysterectomy, kidney stone removal, tonsillectomy, salpingo-oophorectomy, cervical laminectomy;  NYHA Classification:  II;  Canadian Angina Classification:  Class 0: Asymptomatic;  Cardiology Procedures-Invasive:  no history of prior cardiac procedures;  Cardiology Procedures-Noninvasive:  treadmill cardiolite March 2007, echocardiogram August 2013, treadmill Myoview April 2019;  LVEF of 63% documented via nuclear study on 06/27/2017,   ____________________________ FAMILY HISTORY Brother -- Brother dead, Scleroderma Brother -- Brother dead Father -- Father dead, Pacemaker in situ, Hyperthyroidism Mother -- Mother dead, Hypothyroidism, Heart disease ____________________________ SOCIAL HISTORY Alcohol Use:  no alcohol use;  Smoking:  never smoked;  Diet:  regular diet;  Lifestyle:  married;  Exercise:  walking 4-5 days week 40 mins;  Occupation:  retired;  Residence:  lives with husband;   ____________________________ REVIEW OF SYSTEMS General:  denies recent weight change, fatique or change in exercise  tolerance.  Integumentary:no rashes or new skin lesions. Eyes: wears eye glasses/contact  lenses Ears, Nose, Throat, Mouth:  dry mouth Respiratory: dyspnea with exertion Cardiovascular:  please review HPI Abdominal: intermittent diarrheaGenitourinary-Female: no dysuria, urgency, frequency, UTIs, or stress incontinence Musculoskeletal:  arthritis of the hand, chronic low back pain, Raynaud's syndrome Neurological:  denies headaches, stroke, or TIA Psychiatric:  denies depression or anxiety Hematological/Immunologic:  denies any food allergies, bleeding disorders. ____________________________ PHYSICAL EXAMINATION VITAL SIGNS  Blood Pressure:  160/100 Sitting, Left arm, large cuff  , 148/88 Standing, Left arm and large cuff   Pulse:  100/min. Weight:  169.00 lbs. Height:  63.00"BMI: 30  Constitutional:  pleasant white female, in no acute distress, mildly obese Skin:  warm and dry to touch, no apparent skin lesions, or masses noted. Head:  normocephalic, normal hair pattern, no masses or tenderness Eyes:  EOMS Intact, PERRLA, C and S clear, Funduscopic exam not done. ENT:  ears, nose and throat reveal no gross abnormalities.  Dentition good. Neck:  supple, without massess. No JVD, thyromegaly or carotid bruits. Carotid upstroke normal. Chest:  normal symmetry, clear to auscultation. Cardiac:  regular rhythm, normal S1 and S2, No S3 or S4, no murmurs, gallops or rubs detected. Abdomen:  abdomen soft,non-tender, no masses, no hepatospenomegaly, or aneurysm noted Peripheral Pulses:  the femoral,dorsalis pedis, and posterior tibial pulses are full and equal bilaterally with no bruits auscultated. Extremities & Back:  mild no edema present, mild bilateral venous insufficiency changes present Neurological:  no gross motor or sensory deficits noted, affect appropriate, oriented x3. ____________________________ MOST RECENT LIPID PANEL 06/05/17  CHOL TOTL 286 mg/dl, LDL 188 NM, HDL 53 mg/dl, TRIGLYCER 226 mg/dl, ALT 11 u/l, ALK PHOS 80 u/l, CHOL/HDL 5.4 (Calc) and AST 19  u/l ____________________________ IMPRESSIONS/PLAN  1. New onset of dyspnea on exertion with an abnormal myocardial perfusion scan 2. Hypertensive heart disease 3. Obesity 4. Hyperlipidemia 5. Hypothyroidism  Recommendations:  Cardiac catheterization was discussed with the patient including risks of myocardial infarction, death, stroke, bleeding, arrhythmia, dye allergy, or renal insufficiency. The patient and husband understand and are willing to proceed. Possibility of percutaneous intervention at the same setting was also discussed with the patient including risks.  ____________________________ TODAYS ORDERS  1. Draw PT/INR: Today  2. Comprehensive Metabolic Panel: Today  3. Complete Blood Count: Today  4. PTT: Today                       ____________________________ Cardiology Physician:  Kerry Hough MD [facility]     Aimee Brown  Date of visit:  07/09/2017 DOB:  [birth year]    Age:  59 yrs. Medical record number:  [id number]     Account number:  [id number] Primary Care Provider: Burnard Bunting A ____________________________ CURRENT DIAGNOSES  1. Dyspnea  2. Hypertensive heart disease without heart failure  3. Hyperlipidemia  4. Hypothyroidism  5. Obesity ____________________________ ALLERGIES  NKDA ____________________________ MEDICATIONS  1. Colcrys 0.6 mg tablet, 1 p.o. daily PRN  2. Imodium A-D 2 mg tablet, PRN  3. levothyroxine 112 mcg tablet, 1 p.o. daily  4. omeprazole 20 mg capsule,delayed release, 1 p.o. daily  5. ramipril 10 mg capsule, 1 p.o. daily  6. sertraline 50 mg tablet, 1 tablet p.o. daily  7. Vitamin D3 2,000 unit tablet, 1 p.o. daily ____________________________ HISTORY OF PRESENT ILLNESS This 77 year old female is brought in for elective catheterization following workup for dyspnea and an abnormal stress test.. The patient has  been evaluated by me over the years for chest pain as well as dyspnea with negative stress test. She has previous  evidence of diastolic dysfunction and mild LVH. She says her blood pressure is normally controlled at home but has been elevated in the office. She has noted exertional dyspnea walking short distances over the past 6 weeks. She previously had been relatively inactive but has difficulty walking up a hill or less than one quarter of a mile. Her husband noted she would stop and she will then be able to complete the walk. She may have some vague chest tightness at the time that she also walks. She denies PND, orthopnea or claudication. She has some mild edema present at the end of the day.  It is pertinent that she has had difficulty with dry mouth over the past year as well as some Raynaud's phenomena. She has mild arthritis. She reportedly had a chest x-ray last week that was unremarkable. She has seen a rheumatologist and no cause for her dry mouth could be found. She previously had hyperlipidemia but stopped taking statins when she had the dry mouth diagnosed.  She was initially referred and had a myocardial perfusion scan on 4/11 and showed a reversible inferolateral defect of moderate size. She was only able to walk 5 minutes on the treadmill and had frequent ventricular ectopy and couplets with exercise no ST depression. Ejection fraction was 63%. Because of the abnormal myocardial perfusion scan and recent onset of symptoms is brought in for same-day elective catheterization. ____________________________ PAST HISTORY  Past Medical Illnesses:  hypertension, hypothyroidism, hyperlipidemia, obesity, irritable bowel syndrome, osteoarthritis, gout;  Cardiovascular Illnesses:  no previous history of cardiac disease.;  Surgical Procedures:  cesarean section, hysterectomy, kidney stone removal, tonsillectomy, salpingo-oophorectomy, cervical laminectomy;  NYHA Classification:  II;  Canadian Angina Classification:  Class 0: Asymptomatic;  Cardiology Procedures-Invasive:  no history of prior cardiac procedures;   Cardiology Procedures-Noninvasive:  treadmill cardiolite March 2007, echocardiogram August 2013, treadmill Myoview April 2019;  LVEF of 63% documented via nuclear study on 06/27/2017,   ____________________________ FAMILY HISTORY Brother -- Brother dead, Scleroderma Brother -- Brother dead Father -- Father dead, Pacemaker in situ, Hyperthyroidism Mother -- Mother dead, Hypothyroidism, Heart disease ____________________________ SOCIAL HISTORY Alcohol Use:  no alcohol use;  Smoking:  never smoked;  Diet:  regular diet;  Lifestyle:  married;  Exercise:  walking 4-5 days week 40 mins;  Occupation:  retired;  Residence:  lives with husband;   ____________________________ REVIEW OF SYSTEMS General:  denies recent weight change, fatique or change in exercise tolerance.  Integumentary:no rashes or new skin lesions. Eyes: wears eye glasses/contact lenses Ears, Nose, Throat, Mouth:  dry mouth Respiratory: dyspnea with exertion Cardiovascular:  please review HPI Abdominal: intermittent diarrheaGenitourinary-Female: no dysuria, urgency, frequency, UTIs, or stress incontinence Musculoskeletal:  arthritis of the hand, chronic low back pain, Raynaud's syndrome Neurological:  denies headaches, stroke, or TIA Psychiatric:  denies depression or anxiety Hematological/Immunologic:  denies any food allergies, bleeding disorders. ____________________________ PHYSICAL EXAMINATION VITAL SIGNS  Blood Pressure:  160/100 Sitting, Left arm, large cuff  , 148/88 Standing, Left arm and large cuff   Pulse:  100/min. Weight:  169.00 lbs. Height:  63.00"BMI: 30  Constitutional:  pleasant white female, in no acute distress, mildly obese Skin:  warm and dry to touch, no apparent skin lesions, or masses noted. Head:  normocephalic, normal hair pattern, no masses or tenderness Eyes:  EOMS Intact, PERRLA, C and S clear, Funduscopic exam not done.  ENT:  ears, nose and throat reveal no gross abnormalities.  Dentition good. Neck:   supple, without massess. No JVD, thyromegaly or carotid bruits. Carotid upstroke normal. Chest:  normal symmetry, clear to auscultation. Cardiac:  regular rhythm, normal S1 and S2, No S3 or S4, no murmurs, gallops or rubs detected. Abdomen:  abdomen soft,non-tender, no masses, no hepatospenomegaly, or aneurysm noted Peripheral Pulses:  the femoral,dorsalis pedis, and posterior tibial pulses are full and equal bilaterally with no bruits auscultated. Extremities & Back:  mild no edema present, mild bilateral venous insufficiency changes present Neurological:  no gross motor or sensory deficits noted, affect appropriate, oriented x3. ____________________________ MOST RECENT LIPID PANEL 06/05/17  CHOL TOTL 286 mg/dl, LDL 188 NM, HDL 53 mg/dl, TRIGLYCER 226 mg/dl, ALT 11 u/l, ALK PHOS 80 u/l, CHOL/HDL 5.4 (Calc) and AST 19 u/l ____________________________ IMPRESSIONS/PLAN  1. New onset of dyspnea on exertion with an abnormal myocardial perfusion scan 2. Hypertensive heart disease 3. Obesity 4. Hyperlipidemia 5. Hypothyroidism  Recommendations:  Cardiac catheterization was discussed with the patient including risks of myocardial infarction, death, stroke, bleeding, arrhythmia, dye allergy, or renal insufficiency. The patient and husband understand and are willing to proceed. Possibility of percutaneous intervention at the same setting was also discussed with the patient including risks.  ____________________________ TODAYS ORDERS  1. Draw PT/INR: Today  2. Comprehensive Metabolic Panel: Today  3. Complete Blood Count: Today  4. PTT: Today                       ____________________________ Cardiology Physician:  Kerry Hough MD [facility]     Aimee Brown  Date of visit:  07/09/2017 DOB:  17-Dec-1940    Age:  70 yrs. Medical record number:  [id number]     Account number:  [id number] Primary Care Provider: Burnard Bunting A ____________________________ CURRENT DIAGNOSES  1.  Dyspnea  2. Hypertensive heart disease without heart failure  3. Hyperlipidemia  4. Hypothyroidism  5. Obesity ____________________________ ALLERGIES  NKDA ____________________________ MEDICATIONS  1. Colcrys 0.6 mg tablet, 1 p.o. daily PRN  2. Imodium A-D 2 mg tablet, PRN  3. levothyroxine 112 mcg tablet, 1 p.o. daily  4. omeprazole 20 mg capsule,delayed release, 1 p.o. daily  5. ramipril 10 mg capsule, 1 p.o. daily  6. sertraline 50 mg tablet, 1 tablet p.o. daily  7. Vitamin D3 2,000 unit tablet, 1 p.o. daily ____________________________ HISTORY OF PRESENT ILLNESS This 77 year old female is brought in for elective catheterization following workup for dyspnea and an abnormal stress test.. The patient has been evaluated by me over the years for chest pain as well as dyspnea with negative stress test. She has previous evidence of diastolic dysfunction and mild LVH. She says her blood pressure is normally controlled at home but has been elevated in the office. She has noted exertional dyspnea walking short distances over the past 6 weeks. She previously had been relatively inactive but has difficulty walking up a hill or less than one quarter of a mile. Her husband noted she would stop and she will then be able to complete the walk. She may have some vague chest tightness at the time that she also walks. She denies PND, orthopnea or claudication. She has some mild edema present at the end of the day.  It is pertinent that she has had difficulty with dry mouth over the past year as well as some Raynaud's phenomena. She has mild arthritis. She reportedly had a chest x-ray  last week that was unremarkable. She has seen a rheumatologist and no cause for her dry mouth could be found. She previously had hyperlipidemia but stopped taking statins when she had the dry mouth diagnosed.  She was initially referred and had a myocardial perfusion scan on 4/11 and showed a reversible inferolateral defect of  moderate size. She was only able to walk 5 minutes on the treadmill and had frequent ventricular ectopy and couplets with exercise no ST depression. Ejection fraction was 63%. Because of the abnormal myocardial perfusion scan and recent onset of symptoms is brought in for same-day elective catheterization. ____________________________ PAST HISTORY  Past Medical Illnesses:  hypertension, hypothyroidism, hyperlipidemia, obesity, irritable bowel syndrome, osteoarthritis, gout;  Cardiovascular Illnesses:  no previous history of cardiac disease.;  Surgical Procedures:  cesarean section, hysterectomy, kidney stone removal, tonsillectomy, salpingo-oophorectomy, cervical laminectomy;  NYHA Classification:  II;  [place] Angina Classification:  Class 0: Asymptomatic;  Cardiology Procedures-Invasive:  no history of prior cardiac procedures;  Cardiology Procedures-Noninvasive:  treadmill cardiolite March 2007, echocardiogram August 2013, treadmill Myoview April 2019;  LVEF of 63% documented via nuclear study on 06/27/2017,   ____________________________ FAMILY HISTORY Brother -- Brother dead, Scleroderma Brother -- Brother dead Father -- Father dead, Pacemaker in situ, Hyperthyroidism Mother -- Mother dead, Hypothyroidism, Heart disease ____________________________ SOCIAL HISTORY Alcohol Use:  no alcohol use;  Smoking:  never smoked;  Diet:  regular diet;  Lifestyle:  married;  Exercise:  walking 4-5 days week 40 mins;  Occupation:  retired;  Residence:  lives with husband;   ____________________________ REVIEW OF SYSTEMS General:  denies recent weight change, fatique or change in exercise tolerance.  Integumentary:no rashes or new skin lesions. Eyes: wears eye glasses/contact lenses Ears, Nose, Throat, Mouth:  dry mouth Respiratory: dyspnea with exertion Cardiovascular:  please review HPI Abdominal: intermittent diarrheaGenitourinary-Female: no dysuria, urgency, frequency, UTIs, or stress incontinence  Musculoskeletal:  arthritis of the hand, chronic low back pain, Raynaud's syndrome Neurological:  denies headaches, stroke, or TIA Psychiatric:  denies depression or anxiety Hematological/Immunologic:  denies any food allergies, bleeding disorders. ____________________________ PHYSICAL EXAMINATION VITAL SIGNS  Blood Pressure:  160/100 Sitting, Left arm, large cuff  , 148/88 Standing, Left arm and large cuff   Pulse:  100/min. Weight:  169.00 lbs. Height:  63.00"BMI: 30  Constitutional:  pleasant white female, in no acute distress, mildly obese Skin:  warm and dry to touch, no apparent skin lesions, or masses noted. Head:  normocephalic, normal hair pattern, no masses or tenderness Eyes:  EOMS Intact, PERRLA, C and S clear, Funduscopic exam not done. ENT:  ears, nose and throat reveal no gross abnormalities.  Dentition good. Neck:  supple, without massess. No JVD, thyromegaly or carotid bruits. Carotid upstroke normal. Chest:  normal symmetry, clear to auscultation. Cardiac:  regular rhythm, normal S1 and S2, No S3 or S4, no murmurs, gallops or rubs detected. Abdomen:  abdomen soft,non-tender, no masses, no hepatospenomegaly, or aneurysm noted Peripheral Pulses:  the femoral,dorsalis pedis, and posterior tibial pulses are full and equal bilaterally with no bruits auscultated. Extremities & Back:  mild no edema present, mild bilateral venous insufficiency changes present Neurological:  no gross motor or sensory deficits noted, affect appropriate, oriented x3. ____________________________ MOST RECENT LIPID PANEL 06/05/17  CHOL TOTL 286 mg/dl, LDL 188 NM, HDL 53 mg/dl, TRIGLYCER 226 mg/dl, ALT 11 u/l, ALK PHOS 80 u/l, CHOL/HDL 5.4 (Calc) and AST 19 u/l ____________________________ IMPRESSIONS/PLAN  1. New onset of dyspnea on exertion with an abnormal myocardial perfusion scan  2. Hypertensive heart disease 3. Obesity 4. Hyperlipidemia 5. Hypothyroidism  Recommendations:  Cardiac  catheterization was discussed with the patient including risks of myocardial infarction, death, stroke, bleeding, arrhythmia, dye allergy, or renal insufficiency. The patient and husband understand and are willing to proceed. Possibility of percutaneous intervention at the same setting was also discussed with the patient including risks.  ____________________________ TODAYS ORDERS  1. Draw PT/INR: Today  2. Comprehensive Metabolic Panel: Today  3. Complete Blood Count: Today  4. PTT: Today                       ____________________________ Cardiology Physician:  Kerry Hough MD [facility]

## 2017-07-09 NOTE — Progress Notes (Signed)
Aimee Brown  Date of visit:  07/09/2017 DOB:  02-Dec-1940    Age:  77 yrs. Medical record number:  [id number]     Account number:  [id number] Primary Care Provider: Burnard Bunting A ____________________________ CURRENT DIAGNOSES  1. Dyspnea  2. Hypertensive heart disease without heart failure  3. Hyperlipidemia  4. Hypothyroidism  5. Obesity ____________________________ ALLERGIES  NKDA ____________________________ MEDICATIONS  1. Colcrys 0.6 mg tablet, 1 p.o. daily PRN  2. Imodium A-D 2 mg tablet, PRN  3. levothyroxine 112 mcg tablet, 1 p.o. daily  4. omeprazole 20 mg capsule,delayed release, 1 p.o. daily  5. ramipril 10 mg capsule, 1 p.o. daily  6. sertraline 50 mg tablet, 1 tablet p.o. daily  7. Vitamin D3 2,000 unit tablet, 1 p.o. daily ____________________________ HISTORY OF PRESENT ILLNESS This 77 year old female is brought in for elective catheterization following workup for dyspnea and an abnormal stress test.. The patient has been evaluated by me over the years for chest pain as well as dyspnea with negative stress test. She has previous evidence of diastolic dysfunction and mild LVH. She says her blood pressure is normally controlled at home but has been elevated in the office. She has noted exertional dyspnea walking short distances over the past 6 weeks. She previously had been relatively inactive but has difficulty walking up a hill or less than one quarter of a mile. Her husband noted she would stop and she will then be able to complete the walk. She may have some vague chest tightness at the time that she also walks. She denies PND, orthopnea or claudication. She has some mild edema present at the end of the day.  It is pertinent that she has had difficulty with dry mouth over the past year as well as some Raynaud's phenomena. She has mild arthritis. She reportedly had a chest x-ray last week that was unremarkable. She has seen a rheumatologist and no cause for her dry mouth  could be found. She previously had hyperlipidemia but stopped taking statins when she had the dry mouth diagnosed.  She was initially referred and had a myocardial perfusion scan on 4/11 and showed a reversible inferolateral defect of moderate size. She was only able to walk 5 minutes on the treadmill and had frequent ventricular ectopy and couplets with exercise no ST depression. Ejection fraction was 63%. Because of the abnormal myocardial perfusion scan and recent onset of symptoms is brought in for same-day elective catheterization. ____________________________ PAST HISTORY  Past Medical Illnesses:  hypertension, hypothyroidism, hyperlipidemia, obesity, irritable bowel syndrome, osteoarthritis, gout;  Cardiovascular Illnesses:  no previous history of cardiac disease.;  Surgical Procedures:  cesarean section, hysterectomy, kidney stone removal, tonsillectomy, salpingo-oophorectomy, cervical laminectomy;  NYHA Classification:  II;  Canadian Angina Classification:  Class 0: Asymptomatic;  Cardiology Procedures-Invasive:  no history of prior cardiac procedures;  Cardiology Procedures-Noninvasive:  treadmill cardiolite March 2007, echocardiogram August 2013, treadmill Myoview April 2019;  LVEF of 63% documented via nuclear study on 06/27/2017,   ____________________________ FAMILY HISTORY Brother -- Brother dead, Scleroderma Brother -- Brother dead Father -- Father dead, Pacemaker in situ, Hyperthyroidism Mother -- Mother dead, Hypothyroidism, Heart disease ____________________________ SOCIAL HISTORY Alcohol Use:  no alcohol use;  Smoking:  never smoked;  Diet:  regular diet;  Lifestyle:  married;  Exercise:  walking 4-5 days week 40 mins;  Occupation:  retired;  Residence:  lives with husband;   ____________________________ REVIEW OF SYSTEMS General:  denies recent weight change, fatique or change in exercise  tolerance.  Integumentary:no rashes or new skin lesions. Eyes: wears eye glasses/contact  lenses Ears, Nose, Throat, Mouth:  dry mouth Respiratory: dyspnea with exertion Cardiovascular:  please review HPI Abdominal: intermittent diarrheaGenitourinary-Female: no dysuria, urgency, frequency, UTIs, or stress incontinence Musculoskeletal:  arthritis of the hand, chronic low back pain, Raynaud's syndrome Neurological:  denies headaches, stroke, or TIA Psychiatric:  denies depression or anxiety Hematological/Immunologic:  denies any food allergies, bleeding disorders. ____________________________ PHYSICAL EXAMINATION VITAL SIGNS  Blood Pressure:  160/100 Sitting, Left arm, large cuff  , 148/88 Standing, Left arm and large cuff   Pulse:  100/min. Weight:  169.00 lbs. Height:  63.00"BMI: 30  Constitutional:  pleasant white female, in no acute distress, mildly obese Skin:  warm and dry to touch, no apparent skin lesions, or masses noted. Head:  normocephalic, normal hair pattern, no masses or tenderness Eyes:  EOMS Intact, PERRLA, C and S clear, Funduscopic exam not done. ENT:  ears, nose and throat reveal no gross abnormalities.  Dentition good. Neck:  supple, without massess. No JVD, thyromegaly or carotid bruits. Carotid upstroke normal. Chest:  normal symmetry, clear to auscultation. Cardiac:  regular rhythm, normal S1 and S2, No S3 or S4, no murmurs, gallops or rubs detected. Abdomen:  abdomen soft,non-tender, no masses, no hepatospenomegaly, or aneurysm noted Peripheral Pulses:  the femoral,dorsalis pedis, and posterior tibial pulses are full and equal bilaterally with no bruits auscultated. Extremities & Back:  mild no edema present, mild bilateral venous insufficiency changes present Neurological:  no gross motor or sensory deficits noted, affect appropriate, oriented x3. ____________________________ MOST RECENT LIPID PANEL 06/05/17  CHOL TOTL 286 mg/dl, LDL 188 NM, HDL 53 mg/dl, TRIGLYCER 226 mg/dl, ALT 11 u/l, ALK PHOS 80 u/l, CHOL/HDL 5.4 (Calc) and AST 19  u/l ____________________________ IMPRESSIONS/PLAN  1. New onset of dyspnea on exertion with an abnormal myocardial perfusion scan 2. Hypertensive heart disease 3. Obesity 4. Hyperlipidemia 5. Hypothyroidism  Recommendations:  Cardiac catheterization was discussed with the patient including risks of myocardial infarction, death, stroke, bleeding, arrhythmia, dye allergy, or renal insufficiency. The patient and husband understand and are willing to proceed. Possibility of percutaneous intervention at the same setting was also discussed with the patient including risks.  ____________________________ TODAYS ORDERS  1. Draw PT/INR: Today  2. Comprehensive Metabolic Panel: Today  3. Complete Blood Count: Today  4. PTT: Today                       ____________________________ Cardiology Physician:  Kerry Hough MD [facility]     Aimee Brown  Date of visit:  07/09/2017 DOB:  [birth year]    Age:  59 yrs. Medical record number:  [id number]     Account number:  [id number] Primary Care Provider: Burnard Bunting A ____________________________ CURRENT DIAGNOSES  1. Dyspnea  2. Hypertensive heart disease without heart failure  3. Hyperlipidemia  4. Hypothyroidism  5. Obesity ____________________________ ALLERGIES  NKDA ____________________________ MEDICATIONS  1. Colcrys 0.6 mg tablet, 1 p.o. daily PRN  2. Imodium A-D 2 mg tablet, PRN  3. levothyroxine 112 mcg tablet, 1 p.o. daily  4. omeprazole 20 mg capsule,delayed release, 1 p.o. daily  5. ramipril 10 mg capsule, 1 p.o. daily  6. sertraline 50 mg tablet, 1 tablet p.o. daily  7. Vitamin D3 2,000 unit tablet, 1 p.o. daily ____________________________ HISTORY OF PRESENT ILLNESS This 77 year old female is brought in for elective catheterization following workup for dyspnea and an abnormal stress test.. The patient has  been evaluated by me over the years for chest pain as well as dyspnea with negative stress test. She has previous  evidence of diastolic dysfunction and mild LVH. She says her blood pressure is normally controlled at home but has been elevated in the office. She has noted exertional dyspnea walking short distances over the past 6 weeks. She previously had been relatively inactive but has difficulty walking up a hill or less than one quarter of a mile. Her husband noted she would stop and she will then be able to complete the walk. She may have some vague chest tightness at the time that she also walks. She denies PND, orthopnea or claudication. She has some mild edema present at the end of the day.  It is pertinent that she has had difficulty with dry mouth over the past year as well as some Raynaud's phenomena. She has mild arthritis. She reportedly had a chest x-ray last week that was unremarkable. She has seen a rheumatologist and no cause for her dry mouth could be found. She previously had hyperlipidemia but stopped taking statins when she had the dry mouth diagnosed.  She was initially referred and had a myocardial perfusion scan on 4/11 and showed a reversible inferolateral defect of moderate size. She was only able to walk 5 minutes on the treadmill and had frequent ventricular ectopy and couplets with exercise no ST depression. Ejection fraction was 63%. Because of the abnormal myocardial perfusion scan and recent onset of symptoms is brought in for same-day elective catheterization. ____________________________ PAST HISTORY  Past Medical Illnesses:  hypertension, hypothyroidism, hyperlipidemia, obesity, irritable bowel syndrome, osteoarthritis, gout;  Cardiovascular Illnesses:  no previous history of cardiac disease.;  Surgical Procedures:  cesarean section, hysterectomy, kidney stone removal, tonsillectomy, salpingo-oophorectomy, cervical laminectomy;  NYHA Classification:  II;  Canadian Angina Classification:  Class 0: Asymptomatic;  Cardiology Procedures-Invasive:  no history of prior cardiac procedures;   Cardiology Procedures-Noninvasive:  treadmill cardiolite March 2007, echocardiogram August 2013, treadmill Myoview April 2019;  LVEF of 63% documented via nuclear study on 06/27/2017,   ____________________________ FAMILY HISTORY Brother -- Brother dead, Scleroderma Brother -- Brother dead Father -- Father dead, Pacemaker in situ, Hyperthyroidism Mother -- Mother dead, Hypothyroidism, Heart disease ____________________________ SOCIAL HISTORY Alcohol Use:  no alcohol use;  Smoking:  never smoked;  Diet:  regular diet;  Lifestyle:  married;  Exercise:  walking 4-5 days week 40 mins;  Occupation:  retired;  Residence:  lives with husband;   ____________________________ REVIEW OF SYSTEMS General:  denies recent weight change, fatique or change in exercise tolerance.  Integumentary:no rashes or new skin lesions. Eyes: wears eye glasses/contact lenses Ears, Nose, Throat, Mouth:  dry mouth Respiratory: dyspnea with exertion Cardiovascular:  please review HPI Abdominal: intermittent diarrheaGenitourinary-Female: no dysuria, urgency, frequency, UTIs, or stress incontinence Musculoskeletal:  arthritis of the hand, chronic low back pain, Raynaud's syndrome Neurological:  denies headaches, stroke, or TIA Psychiatric:  denies depression or anxiety Hematological/Immunologic:  denies any food allergies, bleeding disorders. ____________________________ PHYSICAL EXAMINATION VITAL SIGNS  Blood Pressure:  160/100 Sitting, Left arm, large cuff  , 148/88 Standing, Left arm and large cuff   Pulse:  100/min. Weight:  169.00 lbs. Height:  63.00"BMI: 30  Constitutional:  pleasant white female, in no acute distress, mildly obese Skin:  warm and dry to touch, no apparent skin lesions, or masses noted. Head:  normocephalic, normal hair pattern, no masses or tenderness Eyes:  EOMS Intact, PERRLA, C and S clear, Funduscopic exam not done.  ENT:  ears, nose and throat reveal no gross abnormalities.  Dentition good. Neck:   supple, without massess. No JVD, thyromegaly or carotid bruits. Carotid upstroke normal. Chest:  normal symmetry, clear to auscultation. Cardiac:  regular rhythm, normal S1 and S2, No S3 or S4, no murmurs, gallops or rubs detected. Abdomen:  abdomen soft,non-tender, no masses, no hepatospenomegaly, or aneurysm noted Peripheral Pulses:  the femoral,dorsalis pedis, and posterior tibial pulses are full and equal bilaterally with no bruits auscultated. Extremities & Back:  mild no edema present, mild bilateral venous insufficiency changes present Neurological:  no gross motor or sensory deficits noted, affect appropriate, oriented x3. ____________________________ MOST RECENT LIPID PANEL 06/05/17  CHOL TOTL 286 mg/dl, LDL 188 NM, HDL 53 mg/dl, TRIGLYCER 226 mg/dl, ALT 11 u/l, ALK PHOS 80 u/l, CHOL/HDL 5.4 (Calc) and AST 19 u/l ____________________________ IMPRESSIONS/PLAN  1. New onset of dyspnea on exertion with an abnormal myocardial perfusion scan 2. Hypertensive heart disease 3. Obesity 4. Hyperlipidemia 5. Hypothyroidism  Recommendations:  Cardiac catheterization was discussed with the patient including risks of myocardial infarction, death, stroke, bleeding, arrhythmia, dye allergy, or renal insufficiency. The patient and husband understand and are willing to proceed. Possibility of percutaneous intervention at the same setting was also discussed with the patient including risks.  ____________________________ TODAYS ORDERS  1. Draw PT/INR: Today  2. Comprehensive Metabolic Panel: Today  3. Complete Blood Count: Today  4. PTT: Today                       ____________________________ Cardiology Physician:  Kerry Hough MD [facility]     Aimee Brown  Date of visit:  07/09/2017 DOB:  17-Dec-1940    Age:  70 yrs. Medical record number:  [id number]     Account number:  [id number] Primary Care Provider: Burnard Bunting A ____________________________ CURRENT DIAGNOSES  1.  Dyspnea  2. Hypertensive heart disease without heart failure  3. Hyperlipidemia  4. Hypothyroidism  5. Obesity ____________________________ ALLERGIES  NKDA ____________________________ MEDICATIONS  1. Colcrys 0.6 mg tablet, 1 p.o. daily PRN  2. Imodium A-D 2 mg tablet, PRN  3. levothyroxine 112 mcg tablet, 1 p.o. daily  4. omeprazole 20 mg capsule,delayed release, 1 p.o. daily  5. ramipril 10 mg capsule, 1 p.o. daily  6. sertraline 50 mg tablet, 1 tablet p.o. daily  7. Vitamin D3 2,000 unit tablet, 1 p.o. daily ____________________________ HISTORY OF PRESENT ILLNESS This 77 year old female is brought in for elective catheterization following workup for dyspnea and an abnormal stress test.. The patient has been evaluated by me over the years for chest pain as well as dyspnea with negative stress test. She has previous evidence of diastolic dysfunction and mild LVH. She says her blood pressure is normally controlled at home but has been elevated in the office. She has noted exertional dyspnea walking short distances over the past 6 weeks. She previously had been relatively inactive but has difficulty walking up a hill or less than one quarter of a mile. Her husband noted she would stop and she will then be able to complete the walk. She may have some vague chest tightness at the time that she also walks. She denies PND, orthopnea or claudication. She has some mild edema present at the end of the day.  It is pertinent that she has had difficulty with dry mouth over the past year as well as some Raynaud's phenomena. She has mild arthritis. She reportedly had a chest x-ray  last week that was unremarkable. She has seen a rheumatologist and no cause for her dry mouth could be found. She previously had hyperlipidemia but stopped taking statins when she had the dry mouth diagnosed.  She was initially referred and had a myocardial perfusion scan on 4/11 and showed a reversible inferolateral defect of  moderate size. She was only able to walk 5 minutes on the treadmill and had frequent ventricular ectopy and couplets with exercise no ST depression. Ejection fraction was 63%. Because of the abnormal myocardial perfusion scan and recent onset of symptoms is brought in for same-day elective catheterization. ____________________________ PAST HISTORY  Past Medical Illnesses:  hypertension, hypothyroidism, hyperlipidemia, obesity, irritable bowel syndrome, osteoarthritis, gout;  Cardiovascular Illnesses:  no previous history of cardiac disease.;  Surgical Procedures:  cesarean section, hysterectomy, kidney stone removal, tonsillectomy, salpingo-oophorectomy, cervical laminectomy;  NYHA Classification:  II;  Canadian Angina Classification:  Class 0: Asymptomatic;  Cardiology Procedures-Invasive:  no history of prior cardiac procedures;  Cardiology Procedures-Noninvasive:  treadmill cardiolite March 2007, echocardiogram August 2013, treadmill Myoview April 2019;  LVEF of 63% documented via nuclear study on 06/27/2017,   ____________________________ FAMILY HISTORY Brother -- Brother dead, Scleroderma Brother -- Brother dead Father -- Father dead, Pacemaker in situ, Hyperthyroidism Mother -- Mother dead, Hypothyroidism, Heart disease ____________________________ SOCIAL HISTORY Alcohol Use:  no alcohol use;  Smoking:  never smoked;  Diet:  regular diet;  Lifestyle:  married;  Exercise:  walking 4-5 days week 40 mins;  Occupation:  retired;  Residence:  lives with husband;   ____________________________ REVIEW OF SYSTEMS General:  denies recent weight change, fatique or change in exercise tolerance.  Integumentary:no rashes or new skin lesions. Eyes: wears eye glasses/contact lenses Ears, Nose, Throat, Mouth:  dry mouth Respiratory: dyspnea with exertion Cardiovascular:  please review HPI Abdominal: intermittent diarrheaGenitourinary-Female: no dysuria, urgency, frequency, UTIs, or stress incontinence  Musculoskeletal:  arthritis of the hand, chronic low back pain, Raynaud's syndrome Neurological:  denies headaches, stroke, or TIA Psychiatric:  denies depression or anxiety Hematological/Immunologic:  denies any food allergies, bleeding disorders. ____________________________ PHYSICAL EXAMINATION VITAL SIGNS  Blood Pressure:  160/100 Sitting, Left arm, large cuff  , 148/88 Standing, Left arm and large cuff   Pulse:  100/min. Weight:  169.00 lbs. Height:  63.00"BMI: 30  Constitutional:  pleasant white female, in no acute distress, mildly obese Skin:  warm and dry to touch, no apparent skin lesions, or masses noted. Head:  normocephalic, normal hair pattern, no masses or tenderness Eyes:  EOMS Intact, PERRLA, C and S clear, Funduscopic exam not done. ENT:  ears, nose and throat reveal no gross abnormalities.  Dentition good. Neck:  supple, without massess. No JVD, thyromegaly or carotid bruits. Carotid upstroke normal. Chest:  normal symmetry, clear to auscultation. Cardiac:  regular rhythm, normal S1 and S2, No S3 or S4, no murmurs, gallops or rubs detected. Abdomen:  abdomen soft,non-tender, no masses, no hepatospenomegaly, or aneurysm noted Peripheral Pulses:  the femoral,dorsalis pedis, and posterior tibial pulses are full and equal bilaterally with no bruits auscultated. Extremities & Back:  mild no edema present, mild bilateral venous insufficiency changes present Neurological:  no gross motor or sensory deficits noted, affect appropriate, oriented x3. ____________________________ MOST RECENT LIPID PANEL 06/05/17  CHOL TOTL 286 mg/dl, LDL 188 NM, HDL 53 mg/dl, TRIGLYCER 226 mg/dl, ALT 11 u/l, ALK PHOS 80 u/l, CHOL/HDL 5.4 (Calc) and AST 19 u/l ____________________________ IMPRESSIONS/PLAN  1. New onset of dyspnea on exertion with an abnormal myocardial perfusion scan  2. Hypertensive heart disease 3. Obesity 4. Hyperlipidemia 5. Hypothyroidism  Recommendations:  Cardiac  catheterization was discussed with the patient including risks of myocardial infarction, death, stroke, bleeding, arrhythmia, dye allergy, or renal insufficiency. The patient and husband understand and are willing to proceed. Possibility of percutaneous intervention at the same setting was also discussed with the patient including risks.  ____________________________ TODAYS ORDERS  1. Draw PT/INR: Today  2. Comprehensive Metabolic Panel: Today  3. Complete Blood Count: Today  4. PTT: Today                       ____________________________ Cardiology Physician:  Kerry Hough MD [facility]

## 2017-07-10 ENCOUNTER — Encounter (HOSPITAL_COMMUNITY)
Admission: RE | Disposition: A | Discharge status: Home / Self Care | Payer: Self-pay | Source: Ambulatory Visit | Attending: Cardiovascular Disease

## 2017-07-10 ENCOUNTER — Ambulatory Visit (HOSPITAL_COMMUNITY)
Admission: RE | Admit: 2017-07-10 | Discharge: 2017-07-10 | Disposition: A | Discharge status: Home / Self Care | Payer: PPO | Source: Ambulatory Visit | Attending: Cardiovascular Disease | Admitting: Cardiovascular Disease

## 2017-07-10 DIAGNOSIS — Z87442 Personal history of urinary calculi: Secondary | ICD-10-CM | POA: Insufficient documentation

## 2017-07-10 DIAGNOSIS — R0609 Other forms of dyspnea: Secondary | ICD-10-CM | POA: Diagnosis present

## 2017-07-10 DIAGNOSIS — M109 Gout, unspecified: Secondary | ICD-10-CM | POA: Diagnosis not present

## 2017-07-10 DIAGNOSIS — E785 Hyperlipidemia, unspecified: Secondary | ICD-10-CM | POA: Insufficient documentation

## 2017-07-10 DIAGNOSIS — Z6830 Body mass index (BMI) 30.0-30.9, adult: Secondary | ICD-10-CM | POA: Diagnosis not present

## 2017-07-10 DIAGNOSIS — Z8249 Family history of ischemic heart disease and other diseases of the circulatory system: Secondary | ICD-10-CM | POA: Diagnosis not present

## 2017-07-10 DIAGNOSIS — Z79899 Other long term (current) drug therapy: Secondary | ICD-10-CM | POA: Insufficient documentation

## 2017-07-10 DIAGNOSIS — K589 Irritable bowel syndrome without diarrhea: Secondary | ICD-10-CM | POA: Diagnosis not present

## 2017-07-10 DIAGNOSIS — Z79890 Hormone replacement therapy (postmenopausal): Secondary | ICD-10-CM | POA: Diagnosis not present

## 2017-07-10 DIAGNOSIS — I208 Other forms of angina pectoris: Secondary | ICD-10-CM

## 2017-07-10 DIAGNOSIS — E669 Obesity, unspecified: Secondary | ICD-10-CM | POA: Insufficient documentation

## 2017-07-10 DIAGNOSIS — I7300 Raynaud's syndrome without gangrene: Secondary | ICD-10-CM | POA: Insufficient documentation

## 2017-07-10 DIAGNOSIS — I119 Hypertensive heart disease without heart failure: Secondary | ICD-10-CM | POA: Diagnosis not present

## 2017-07-10 DIAGNOSIS — M1990 Unspecified osteoarthritis, unspecified site: Secondary | ICD-10-CM | POA: Diagnosis not present

## 2017-07-10 DIAGNOSIS — R9439 Abnormal result of other cardiovascular function study: Secondary | ICD-10-CM | POA: Diagnosis present

## 2017-07-10 DIAGNOSIS — Z90710 Acquired absence of both cervix and uterus: Secondary | ICD-10-CM | POA: Insufficient documentation

## 2017-07-10 DIAGNOSIS — Z9889 Other specified postprocedural states: Secondary | ICD-10-CM | POA: Diagnosis not present

## 2017-07-10 DIAGNOSIS — I2510 Atherosclerotic heart disease of native coronary artery without angina pectoris: Secondary | ICD-10-CM | POA: Diagnosis not present

## 2017-07-10 DIAGNOSIS — I2089 Other forms of angina pectoris: Secondary | ICD-10-CM | POA: Clinically undetermined

## 2017-07-10 DIAGNOSIS — R0600 Dyspnea, unspecified: Secondary | ICD-10-CM | POA: Diagnosis present

## 2017-07-10 DIAGNOSIS — E039 Hypothyroidism, unspecified: Secondary | ICD-10-CM | POA: Insufficient documentation

## 2017-07-10 HISTORY — PX: LEFT HEART CATH AND CORONARY ANGIOGRAPHY: CATH118249

## 2017-07-10 SURGERY — LEFT HEART CATH AND CORONARY ANGIOGRAPHY
Anesthesia: LOCAL

## 2017-07-10 MED ORDER — ASPIRIN 81 MG PO CHEW
81.0000 mg | CHEWABLE_TABLET | ORAL | Status: AC
  Administered 2017-07-10: 81 mg via ORAL

## 2017-07-10 MED ORDER — SODIUM CHLORIDE 0.9% FLUSH: 3.0000 mL | INTRAVENOUS | Status: DC | PRN

## 2017-07-10 MED ORDER — SODIUM CHLORIDE 0.9 % IV SOLN: 250.0000 mL | INTRAVENOUS | Status: DC | PRN

## 2017-07-10 MED ORDER — NITROGLYCERIN 1 MG/10 ML FOR IR/CATH LAB
INTRA_ARTERIAL | Status: AC
  Filled 2017-07-10: qty 10

## 2017-07-10 MED ORDER — VERAPAMIL HCL 2.5 MG/ML IV SOLN
INTRAVENOUS | Status: AC
  Filled 2017-07-10: qty 2

## 2017-07-10 MED ORDER — ONDANSETRON HCL 4 MG/2ML IJ SOLN: 4.0000 mg | Freq: Four times a day (QID) | INTRAMUSCULAR | Status: DC | PRN

## 2017-07-10 MED ORDER — HEPARIN SODIUM (PORCINE) 1000 UNIT/ML IJ SOLN
INTRAMUSCULAR | Status: AC
  Filled 2017-07-10: qty 1

## 2017-07-10 MED ORDER — SODIUM CHLORIDE 0.9 % IV SOLN: INTRAVENOUS | Status: AC

## 2017-07-10 MED ORDER — FENTANYL CITRATE (PF) 100 MCG/2ML IJ SOLN
INTRAMUSCULAR | Status: AC
  Filled 2017-07-10: qty 2

## 2017-07-10 MED ORDER — HEPARIN (PORCINE) IN NACL 1000-0.9 UT/500ML-% IV SOLN
INTRAVENOUS | Status: AC
  Filled 2017-07-10: qty 1000

## 2017-07-10 MED ORDER — FENTANYL CITRATE (PF) 100 MCG/2ML IJ SOLN
INTRAMUSCULAR | Status: DC | PRN
  Administered 2017-07-10: 25 ug via INTRAVENOUS

## 2017-07-10 MED ORDER — SODIUM CHLORIDE 0.9% FLUSH: 3.0000 mL | Freq: Two times a day (BID) | INTRAVENOUS | Status: DC

## 2017-07-10 MED ORDER — LIDOCAINE HCL (PF) 1 % IJ SOLN
INTRAMUSCULAR | Status: AC
  Filled 2017-07-10: qty 30

## 2017-07-10 MED ORDER — ASPIRIN 81 MG PO CHEW
CHEWABLE_TABLET | ORAL | Status: AC
  Administered 2017-07-10: 81 mg via ORAL
  Filled 2017-07-10: qty 1

## 2017-07-10 MED ORDER — HEPARIN (PORCINE) IN NACL 2-0.9 UNITS/ML
INTRAMUSCULAR | Status: AC | PRN
  Administered 2017-07-10 (×2): 500 mL via INTRA_ARTERIAL

## 2017-07-10 MED ORDER — IOHEXOL 350 MG/ML SOLN
INTRAVENOUS | Status: DC | PRN
  Administered 2017-07-10: 40 mL via INTRA_ARTERIAL

## 2017-07-10 MED ORDER — SODIUM CHLORIDE 0.9 % WEIGHT BASED INFUSION
3.0000 mL/kg/h | INTRAVENOUS | Status: DC
  Administered 2017-07-10: 3 mL/kg/h via INTRAVENOUS

## 2017-07-10 MED ORDER — LIDOCAINE HCL (PF) 1 % IJ SOLN
INTRAMUSCULAR | Status: DC | PRN
  Administered 2017-07-10: 2 mL via INTRADERMAL

## 2017-07-10 MED ORDER — MIDAZOLAM HCL 2 MG/2ML IJ SOLN
INTRAMUSCULAR | Status: AC
  Filled 2017-07-10: qty 2

## 2017-07-10 MED ORDER — VERAPAMIL HCL 2.5 MG/ML IV SOLN
INTRAVENOUS | Status: DC | PRN
  Administered 2017-07-10: 10 mL via INTRA_ARTERIAL

## 2017-07-10 MED ORDER — HEPARIN SODIUM (PORCINE) 1000 UNIT/ML IJ SOLN
INTRAMUSCULAR | Status: DC | PRN
  Administered 2017-07-10: 4000 [IU] via INTRAVENOUS

## 2017-07-10 MED ORDER — ACETAMINOPHEN 325 MG PO TABS: 650.0000 mg | ORAL_TABLET | ORAL | Status: DC | PRN

## 2017-07-10 MED ORDER — MIDAZOLAM HCL 2 MG/2ML IJ SOLN
INTRAMUSCULAR | Status: DC | PRN
  Administered 2017-07-10: 1 mg via INTRAVENOUS

## 2017-07-10 MED ORDER — SODIUM CHLORIDE 0.9 % WEIGHT BASED INFUSION: 1.0000 mL/kg/h | INTRAVENOUS | Status: DC

## 2017-07-10 SURGICAL SUPPLY — 10 items
BAND ZEPHYR COMPRESS 30 LONG (HEMOSTASIS) ×2 IMPLANT
CATH OPTITORQUE TIG 4.0 5F (CATHETERS) ×2 IMPLANT
DEVICE RAD COMP TR BAND LRG (VASCULAR PRODUCTS) ×2 IMPLANT
GLIDESHEATH SLEND A-KIT 6F 22G (SHEATH) ×2 IMPLANT
GUIDEWIRE INQWIRE 1.5J.035X260 (WIRE) ×1 IMPLANT
INQWIRE 1.5J .035X260CM (WIRE) ×2
KIT HEART LEFT (KITS) ×2 IMPLANT
PACK CARDIAC CATHETERIZATION (CUSTOM PROCEDURE TRAY) ×2 IMPLANT
TRANSDUCER W/STOPCOCK (MISCELLANEOUS) ×2 IMPLANT
TUBING CIL FLEX 10 FLL-RA (TUBING) ×2 IMPLANT

## 2017-07-10 NOTE — Interval H&P Note (Signed)
History and Physical Interval Note:  07/10/2017 2:29 PM  Kipp Laurence  has presented today for surgery, with the diagnosis of Abnormal Nuclear Stress Test to evaluate Dyspnea on Exertion (as anginal equivalent)  The various methods of treatment have been discussed with the patient and family. After consideration of risks, benefits and other options for treatment, the patient has consented to  Procedure(s): LEFT HEART CATH AND CORONARY ANGIOGRAPHY (N/A) as a surgical intervention .  The patient's history has been reviewed, patient examined, no change in status, stable for surgery.  I have reviewed the patient's chart and labs.  Questions were answered to the patient's satisfaction.     Cath Lab Visit (complete for each Cath Lab visit)  Clinical Evaluation Leading to the Procedure:   ACS: No.  Non-ACS:    Anginal Classification: CCS III  Anti-ischemic medical therapy: Minimal Therapy (1 class of medications)  Non-Invasive Test Results: Intermediate-risk stress test findings: cardiac mortality 1-3%/year  Prior CABG: No previous CABG  Glenetta Hew

## 2017-07-10 NOTE — Discharge Instructions (Signed)
Drink plenty of fluids over next 48 hours and keep right wrist heart level for 24 hours ° °Radial Site Care °Refer to this sheet in the next few weeks. These instructions provide you with information about caring for yourself after your procedure. Your health care provider may also give you more specific instructions. Your treatment has been planned according to current medical practices, but problems sometimes occur. Call your health care provider if you have any problems or questions after your procedure. °What can I expect after the procedure? °After your procedure, it is typical to have the following: °· Bruising at the radial site that usually fades within 1-2 weeks. °· Blood collecting in the tissue (hematoma) that may be painful to the touch. It should usually decrease in size and tenderness within 1-2 weeks. ° °Follow these instructions at home: °· Take medicines only as directed by your health care provider. °· You may shower 24-48 hours after the procedure or as directed by your health care provider. Remove the bandage (dressing) and gently wash the site with plain soap and water. Pat the area dry with a clean towel. Do not rub the site, because this may cause bleeding. °· Do not take baths, swim, or use a hot tub until your health care provider approves. °· Check your insertion site every day for redness, swelling, or drainage. °· Do not apply powder or lotion to the site. °· Do not flex or bend the affected arm for 24 hours or as directed by your health care provider. °· Do not push or pull heavy objects with the affected arm for 24 hours or as directed by your health care provider. °· Do not lift over 10 lb (4.5 kg) for 5 days after your procedure or as directed by your health care provider. °· Ask your health care provider when it is okay to: °? Return to work or school. °? Resume usual physical activities or sports. °? Resume sexual activity. °· Do not drive home if you are discharged the same day as  the procedure. Have someone else drive you. °· You may drive 24 hours after the procedure unless otherwise instructed by your health care provider. °· Do not operate machinery or power tools for 24 hours after the procedure. °· If your procedure was done as an outpatient procedure, which means that you went home the same day as your procedure, a responsible adult should be with you for the first 24 hours after you arrive home. °· Keep all follow-up visits as directed by your health care provider. This is important. °Contact a health care provider if: °· You have a fever. °· You have chills. °· You have increased bleeding from the radial site. Hold pressure on the site. °Get help right away if: °· You have unusual pain at the radial site. °· You have redness, warmth, or swelling at the radial site. °· You have drainage (other than a small amount of blood on the dressing) from the radial site. °· The radial site is bleeding, and the bleeding does not stop after 30 minutes of holding steady pressure on the site. °· Your arm or hand becomes pale, cool, tingly, or numb. °This information is not intended to replace advice given to you by your health care provider. Make sure you discuss any questions you have with your health care provider. °Document Released: 04/07/2010 Document Revised: 08/11/2015 Document Reviewed: 09/21/2013 °Elsevier Interactive Patient Education © 2018 [employer] ° °

## 2017-07-11 ENCOUNTER — Encounter (HOSPITAL_COMMUNITY): Payer: Self-pay | Admitting: Cardiology

## 2017-07-12 MED FILL — Heparin Sod (Porcine)-NaCl IV Soln 1000 Unit/500ML-0.9%: INTRAVENOUS | Qty: 1000 | Status: AC

## 2017-07-25 ENCOUNTER — Encounter: Payer: Self-pay | Admitting: Pulmonary Disease

## 2017-07-25 ENCOUNTER — Ambulatory Visit (INDEPENDENT_AMBULATORY_CARE_PROVIDER_SITE_OTHER): Payer: PPO | Admitting: Pulmonary Disease

## 2017-07-25 ENCOUNTER — Ambulatory Visit (HOSPITAL_BASED_OUTPATIENT_CLINIC_OR_DEPARTMENT_OTHER)
Admission: RE | Admit: 2017-07-25 | Discharge: 2017-07-25 | Disposition: A | Discharge status: Home / Self Care | Payer: PPO | Source: Ambulatory Visit | Attending: Pulmonary Disease | Admitting: Pulmonary Disease

## 2017-07-25 VITALS — BP 144/84 | HR 79 | Ht 63.0 in | Wt 164.0 lb

## 2017-07-25 DIAGNOSIS — R0609 Other forms of dyspnea: Secondary | ICD-10-CM

## 2017-07-25 DIAGNOSIS — M3500 Sicca syndrome, unspecified: Secondary | ICD-10-CM | POA: Diagnosis not present

## 2017-07-25 DIAGNOSIS — R0600 Dyspnea, unspecified: Secondary | ICD-10-CM

## 2017-07-25 DIAGNOSIS — I119 Hypertensive heart disease without heart failure: Secondary | ICD-10-CM | POA: Diagnosis not present

## 2017-07-25 NOTE — Progress Notes (Signed)
Synopsis: Referred in May 2019 for Dyspnea.  She was initially referred to cardiology and had a heart catheterization which did not show significant coronary artery disease.  End-diastolic pressure was slightly elevated.  She was diagnosed with Sjogren's in 2018.  Subjective:   PATIENT ID: Aimee Brown GENDER: female DOB: 11-Mar-1941, MRN: [id number]   HPI  Chief Complaint  Patient presents with  . Pulm Consult    Referred by Dr. Burnard Bunting for DOE. Patient states she get SOB during long walks. Currently not using oxygen. Has been cleared by cardiology (Dr. Wynonia Lawman). Also has had a cough since January 2019, productive with yellow/green mucus.      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 This has resolved.  She still feels like there is congestion  GERD: > she takes omeprazole which controls this well  She has noticed some ankle swelling recently.  She took lasix a year ago but she stopped it because of her dry mouth.  She was diagnosed with Sjogren's a year ago for the dry mouth.  She went to Dr. Amil Amen and several medicines were tried and they did not help.  She was trying to treat dry mouth alone but now she has severely dry eyes.  She now feels like she has a stick in her eye.  No family members have lung problems.  Her brother had scleroderma which affected his "internal organs".    She feels generally weak, but she hasn't noted any particular specific muscle weakness.      Past Medical History:  Diagnosis Date  . Depression   .  Hypercholesteremia   . Hypertension      Family History  Problem Relation Age of Onset  . Breast cancer Paternal Aunt   . Breast cancer Maternal Grandmother   . Heart disease Mother   . Heart disease Father   . Scleroderma Brother      Social History   Socioeconomic History  . Marital status: Married    Spouse name: Not on file  . Number of children: Not on file  . Years of education: Not on file  . Highest education level: Not on file  Occupational History  . Not on file  Social Needs  . Financial resource strain: Not on file  . Food insecurity:    Worry: Not on file    Inability: Not on file  . Transportation needs:    Medical: Not on file    Non-medical: Not on file  Tobacco Use  . Smoking status: Never Smoker  . Smokeless tobacco: Never Used  Substance and Sexual Activity  . Alcohol use: No  . Drug use: Never  . Sexual activity: Not on file  Lifestyle  . Physical activity:    Days per week: Not on file    Minutes per session: Not on file  . Stress: Not on file  Relationships  . Social connections:    Talks on phone: Not on file  Gets together: Not on file    Attends religious service: Not on file    Active member of club or organization: Not on file    Attends meetings of clubs or organizations: Not on file    Relationship status: Not on file  . Intimate partner violence:    Fear of current or ex partner: Not on file    Emotionally abused: Not on file    Physically abused: Not on file    Forced sexual activity: Not on file  Other Topics Concern  . Not on file  Social History Narrative  . Not on file     No Known Allergies   Outpatient Medications Prior to Visit  Medication Sig Dispense Refill  . Cholecalciferol (VITAMIN D PO) Take 1 tablet by mouth daily.    Marland Kitchen levothyroxine (SYNTHROID, LEVOTHROID) 112 MCG tablet Take 112 mcg by mouth daily.     Marland Kitchen omeprazole (PRILOSEC) 20 MG capsule Take 20 mg by mouth daily.  1  . ramipril (ALTACE) 10 MG  capsule Take 10 mg by mouth daily.  3  . sertraline (ZOLOFT) 50 MG tablet Take 50 mg by mouth daily.     No facility-administered medications prior to visit.     Review of Systems  Constitutional: Negative for chills, fever, malaise/fatigue and weight loss.  HENT: Negative for congestion, nosebleeds, sinus pain and sore throat.   Eyes: Negative for photophobia, pain and discharge.  Respiratory: Positive for shortness of breath. Negative for cough, hemoptysis, sputum production and wheezing.   Cardiovascular: Negative for chest pain, palpitations, orthopnea and leg swelling.  Gastrointestinal: Negative for abdominal pain, constipation, diarrhea, nausea and vomiting.  Genitourinary: Negative for dysuria, frequency, hematuria and urgency.  Musculoskeletal: Negative for back pain, joint pain, myalgias and neck pain.  Skin: Negative for itching and rash.  Neurological: Negative for tingling, tremors, sensory change, speech change, focal weakness, seizures, weakness and headaches.  Psychiatric/Behavioral: Negative for memory loss, substance abuse and suicidal ideas. The patient is not nervous/anxious.       Objective:  Physical Exam   Vitals:   07/25/17 1512  BP: (!) 144/84  Pulse: 79  SpO2: 94%  Weight: 164 lb (74.4 kg)  Height: 5\' 3"  (1.6 m)    RA  Gen: well appearing, no acute distress HENT: NCAT, OP clear, neck supple without masses Eyes: PERRL, EOMi Lymph: no cervical lymphadenopathy PULM: Few crackles bases B, normal effort CV: RRR, no mgr, no JVD GI: BS+, soft, nontender, no hsm Derm: no rash or skin breakdown MSK: normal bulk and tone Neuro: A&Ox4, CN II-XII intact, strength 5/5 in all 4 extremities Psyche: normal mood and affect   CBC No results found for: WBC, RBC, HGB, HCT, PLT, MCV, MCH, MCHC, RDW, LYMPHSABS, MONOABS, EOSABS, BASOSABS   Chest imaging: none  PFT:  Labs:  Path:  Echo:  Heart Catheterization:  06/2017 The left ventricular systolic  function is normal. EF 55-65% by visual estimate.  LV end diastolic pressure is moderately elevated. 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 otherwise essentially angiographically normal coronaries  Records from her visit with Dr. Wynonia Lawman reviewed where she was seen for shortness of breath and plans were made for cardiac catheterization.  There is reference in that note to diastolic heart failure and LVH.    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  Objectively she has crackles in the bases of her lungs and dry mouth.  She has been diagnosed with Sjogren's disease in the last year.  The differential diagnosis includes Sjogren's related pulmonary hypertension or pulmonary fibrosis.  Her diastolic heart failure may also contribute to a degree but I do not suspect that that is the primary cause as she does not appear volume overloaded on physical exam.  She is also likely deconditioned.  Plan: Shortness of breath: We will check a test called a lung function test We will check a chest x-ray If either of these are abnormal we will arrange for a high-resolution CT scan of the chest.  Diastolic heart failure: We will asked Dr. Thurman Coyer office to send over the results of an echocardiogram  Sjogren's disease: We will request records from Dr. Melissa Noon office  We will see you back in 2 to 4 weeks to go over these results either with myself or a nurse practitioner    Current Outpatient Medications:  .  Cholecalciferol (VITAMIN D PO), Take 1 tablet by mouth daily., Disp: , Rfl:  .  levothyroxine (SYNTHROID, LEVOTHROID) 112 MCG tablet, Take 112 mcg by mouth  daily. , Disp: , Rfl:  .  omeprazole (PRILOSEC) 20 MG capsule, Take 20 mg by mouth daily., Disp: , Rfl: 1 .  ramipril (ALTACE) 10 MG capsule, Take 10 mg by mouth daily., Disp: , Rfl: 3 .  sertraline (ZOLOFT) 50 MG tablet, Take 50 mg by mouth daily., Disp: , Rfl:

## 2017-07-25 NOTE — Addendum Note (Signed)
Addended by: Valerie Salts on: 07/25/2017 03:46 PM   Modules accepted: Orders

## 2017-07-25 NOTE — Patient Instructions (Signed)
Shortness of breath: We will check a test called a lung function test We will check a chest x-ray If either of these are abnormal we will arrange for a high-resolution CT scan of the chest.  Diastolic heart failure: We will asked Dr. Thurman Coyer office to send over the results of an echocardiogram  Sjogren's disease: We will request records from Dr. Melissa Noon office  We will see you back in 2 to 4 weeks to go over these results either with myself or a nurse practitioner

## 2017-08-01 ENCOUNTER — Telehealth: Payer: Self-pay | Admitting: Pulmonary Disease

## 2017-08-01 NOTE — Telephone Encounter (Signed)
Called and spoke to patient. Gave results of chest xray per MD. Patient verbalized understanding and asked to schedule her PFT and follow up appointment. Helped patient with that and scheduled patient with PFT on 08/19/17 at 4pm and the next day OV with BQ at 10:15. Offered same day appointments with NPs but patient requested that she be seen by BQ.  Below is result note information. Nothing further is needed at this time.   Notes recorded by Dolores Lory, RN on 08/01/2017 at 8:40 AM EDT Attempted to call patient, no answer, left message to call back. ------  Notes recorded by Dolores Lory, RN on 07/30/2017 at 2:34 PM EDT Attempted to call patient, no answer, no voicemail, will call back later. ------  Notes recorded by Juanito Doom, MD on 07/29/2017 at 11:58 AM EDT A, Please let the patient know this was OK Thanks, B

## 2017-08-01 NOTE — Telephone Encounter (Signed)
Pt request home phone be called first and if no answer then cell phone. Home- [phone] Cell- [phone]

## 2017-08-09 DIAGNOSIS — J209 Acute bronchitis, unspecified: Secondary | ICD-10-CM | POA: Diagnosis not present

## 2017-08-09 DIAGNOSIS — R05 Cough: Secondary | ICD-10-CM | POA: Diagnosis not present

## 2017-08-09 DIAGNOSIS — Z6829 Body mass index (BMI) 29.0-29.9, adult: Secondary | ICD-10-CM | POA: Diagnosis not present

## 2017-08-19 ENCOUNTER — Ambulatory Visit (INDEPENDENT_AMBULATORY_CARE_PROVIDER_SITE_OTHER): Payer: PPO | Admitting: Pulmonary Disease

## 2017-08-19 DIAGNOSIS — R0600 Dyspnea, unspecified: Secondary | ICD-10-CM

## 2017-08-19 DIAGNOSIS — R0609 Other forms of dyspnea: Secondary | ICD-10-CM

## 2017-08-19 LAB — PULMONARY FUNCTION TEST
DL/VA % pred: 100 %
DL/VA: 4.82 ml/min/mmHg/L
DLCO unc % pred: 54 %
DLCO unc: 13.16 ml/min/mmHg
FEF 25-75 Post: 0.75 L/sec
FEF 25-75 Pre: 0.63 L/sec
FEF2575-%Change-Post: 18 %
FEF2575-%Pred-Post: 47 %
FEF2575-%Pred-Pre: 40 %
FEV1-%Change-Post: 3 %
FEV1-%Pred-Post: 48 %
FEV1-%Pred-Pre: 46 %
FEV1-Post: 1 L
FEV1-Pre: 0.96 L
FEV1FVC-%Change-Post: -4 %
FEV1FVC-%Pred-Pre: 97 %
FEV6-%Change-Post: 10 %
FEV6-%Pred-Post: 55 %
FEV6-%Pred-Pre: 50 %
FEV6-Post: 1.44 L
FEV6-Pre: 1.31 L
FEV6FVC-%Change-Post: 1 %
FEV6FVC-%Pred-Post: 104 %
FEV6FVC-%Pred-Pre: 103 %
FVC-%Change-Post: 9 %
FVC-%Pred-Post: 52 %
FVC-%Pred-Pre: 48 %
FVC-Post: 1.45 L
FVC-Pre: 1.33 L
Post FEV1/FVC ratio: 69 %
Post FEV6/FVC ratio: 100 %
Pre FEV1/FVC ratio: 72 %
Pre FEV6/FVC Ratio: 98 %
RV % pred: 177 %
RV: 4.13 L
TLC % pred: 108 %
TLC: 5.47 L

## 2017-08-19 NOTE — Progress Notes (Signed)
PFT done today. 

## 2017-08-20 ENCOUNTER — Encounter: Payer: Self-pay | Admitting: Pulmonary Disease

## 2017-08-20 ENCOUNTER — Ambulatory Visit: Payer: PPO | Admitting: Pulmonary Disease

## 2017-08-20 ENCOUNTER — Other Ambulatory Visit: Payer: PPO

## 2017-08-20 VITALS — BP 130/78 | HR 53 | Ht 63.0 in | Wt 165.0 lb

## 2017-08-20 DIAGNOSIS — M3500 Sicca syndrome, unspecified: Secondary | ICD-10-CM | POA: Diagnosis not present

## 2017-08-20 DIAGNOSIS — J432 Centrilobular emphysema: Secondary | ICD-10-CM

## 2017-08-20 DIAGNOSIS — R0600 Dyspnea, unspecified: Secondary | ICD-10-CM | POA: Diagnosis not present

## 2017-08-20 MED ORDER — UMECLIDINIUM-VILANTEROL 62.5-25 MCG/INH IN AEPB: 1.0000 | INHALATION_SPRAY | Freq: Every day | RESPIRATORY_TRACT | 0 refills | Status: DC

## 2017-08-20 MED ORDER — UMECLIDINIUM-VILANTEROL 62.5-25 MCG/INH IN AEPB: 1.0000 | INHALATION_SPRAY | Freq: Every day | RESPIRATORY_TRACT | 5 refills | Status: DC

## 2017-08-20 MED ORDER — ALBUTEROL SULFATE HFA 108 (90 BASE) MCG/ACT IN AERS: 2.0000 | INHALATION_SPRAY | Freq: Four times a day (QID) | RESPIRATORY_TRACT | 6 refills | Status: DC | PRN

## 2017-08-20 NOTE — Addendum Note (Signed)
Addended by: Dolores Lory on: 08/20/2017 11:01 AM   Modules accepted: Orders
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
## 2017-08-20 NOTE — Addendum Note (Signed)
Addended by: Dolores Lory on: 08/20/2017 10:40 AM   Modules accepted: Orders

## 2017-08-20 NOTE — Addendum Note (Signed)
Addended by: Vivia Ewing on: 08/20/2017 10:37 AM   Modules accepted: Orders

## 2017-08-20 NOTE — Patient Instructions (Signed)
COPD: This term means chronic obstructive pulmonary disease. Need to make sure that your immunizations are up-to-date (both forms of the pneumonia vaccine and annual flu shot) Start taking Anoro 1 puff daily no matter how you feel Use albuterol 2 puffs every 4-6 hours as needed for chest tightness wheezing or shortness of breath  Come back and see [place] in 4 weeks to make sure things are doing better on Anoro.  If not then we will need to make arrangements for a CT scan of your chest to assess for other causes of shortness of breath.

## 2017-08-20 NOTE — Telephone Encounter (Signed)
error 

## 2017-08-24 LAB — ALPHA-1 ANTITRYPSIN PHENOTYPE: A-1 Antitrypsin, Ser: 120 mg/dL (ref 83–199)

## 2017-08-27 ENCOUNTER — Telehealth: Payer: Self-pay | Admitting: Pulmonary Disease

## 2017-08-27 NOTE — Telephone Encounter (Signed)
Notes recorded by Dolores Lory, RN on 08/27/2017 at 9:07 AM EDT Attempted to call patient, no answer, message left to call back. ------  Notes recorded by Juanito Doom, MD on 08/27/2017 at 8:31 AM EDT BJ, Please let the patient know this was OK Thanks, B  Spoke with patient-she is aware of results and no further questions or concerns at this time.

## 2017-09-11 DIAGNOSIS — Z6829 Body mass index (BMI) 29.0-29.9, adult: Secondary | ICD-10-CM | POA: Diagnosis not present

## 2017-09-11 DIAGNOSIS — R0609 Other forms of dyspnea: Secondary | ICD-10-CM | POA: Diagnosis not present

## 2017-09-11 DIAGNOSIS — I7300 Raynaud's syndrome without gangrene: Secondary | ICD-10-CM | POA: Diagnosis not present

## 2017-09-11 DIAGNOSIS — E663 Overweight: Secondary | ICD-10-CM | POA: Diagnosis not present

## 2017-09-11 DIAGNOSIS — M3500 Sicca syndrome, unspecified: Secondary | ICD-10-CM | POA: Diagnosis not present

## 2017-09-11 DIAGNOSIS — M150 Primary generalized (osteo)arthritis: Secondary | ICD-10-CM | POA: Diagnosis not present

## 2017-10-01 ENCOUNTER — Ambulatory Visit: Payer: PPO | Admitting: Pulmonary Disease

## 2017-10-01 ENCOUNTER — Ambulatory Visit (HOSPITAL_BASED_OUTPATIENT_CLINIC_OR_DEPARTMENT_OTHER)
Admission: RE | Admit: 2017-10-01 | Discharge: 2017-10-01 | Disposition: A | Discharge status: Home / Self Care | Payer: PPO | Source: Ambulatory Visit | Attending: Pulmonary Disease | Admitting: Pulmonary Disease

## 2017-10-01 ENCOUNTER — Encounter: Payer: Self-pay | Admitting: Pulmonary Disease

## 2017-10-01 VITALS — BP 160/98 | HR 84 | Ht 63.0 in | Wt 166.0 lb

## 2017-10-01 DIAGNOSIS — J432 Centrilobular emphysema: Secondary | ICD-10-CM

## 2017-10-01 DIAGNOSIS — M3500 Sicca syndrome, unspecified: Secondary | ICD-10-CM | POA: Insufficient documentation

## 2017-10-01 DIAGNOSIS — I700 Atherosclerosis of aorta: Secondary | ICD-10-CM | POA: Insufficient documentation

## 2017-10-01 DIAGNOSIS — R0602 Shortness of breath: Secondary | ICD-10-CM

## 2017-10-01 DIAGNOSIS — I2510 Atherosclerotic heart disease of native coronary artery without angina pectoris: Secondary | ICD-10-CM | POA: Diagnosis not present

## 2017-10-01 DIAGNOSIS — J449 Chronic obstructive pulmonary disease, unspecified: Secondary | ICD-10-CM | POA: Diagnosis not present

## 2017-10-01 DIAGNOSIS — R918 Other nonspecific abnormal finding of lung field: Secondary | ICD-10-CM | POA: Diagnosis not present

## 2017-10-01 DIAGNOSIS — J479 Bronchiectasis, uncomplicated: Secondary | ICD-10-CM | POA: Insufficient documentation

## 2017-10-01 NOTE — Addendum Note (Signed)
Addended by: Dolores Lory on: 10/01/2017 11:02 AM   Modules accepted: Orders
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
## 2017-10-01 NOTE — Patient Instructions (Signed)
COPD: Continue Anoro 1 puff daily Use albuterol as needed for chest tightness wheezing or shortness of breath Get a flu shot in the fall Stay active Practice good hand hygiene  Daily mucus production in the setting of Sjogren's disease: We will check a test called a high-resolution CT scan of the chest to look for evidence of scarring in your lungs or something called bronchiectasis  Follow-up 4 weeks or sooner if needed

## 2017-10-16 DIAGNOSIS — E663 Overweight: Secondary | ICD-10-CM | POA: Diagnosis not present

## 2017-10-16 DIAGNOSIS — R0609 Other forms of dyspnea: Secondary | ICD-10-CM | POA: Diagnosis not present

## 2017-10-16 DIAGNOSIS — Z6829 Body mass index (BMI) 29.0-29.9, adult: Secondary | ICD-10-CM | POA: Diagnosis not present

## 2017-10-16 DIAGNOSIS — M3500 Sicca syndrome, unspecified: Secondary | ICD-10-CM | POA: Diagnosis not present

## 2017-10-16 DIAGNOSIS — I7300 Raynaud's syndrome without gangrene: Secondary | ICD-10-CM | POA: Diagnosis not present

## 2017-10-16 DIAGNOSIS — M150 Primary generalized (osteo)arthritis: Secondary | ICD-10-CM | POA: Diagnosis not present

## 2017-11-06 DIAGNOSIS — L578 Other skin changes due to chronic exposure to nonionizing radiation: Secondary | ICD-10-CM | POA: Diagnosis not present

## 2017-11-06 DIAGNOSIS — D485 Neoplasm of uncertain behavior of skin: Secondary | ICD-10-CM | POA: Diagnosis not present

## 2017-11-06 DIAGNOSIS — C44729 Squamous cell carcinoma of skin of left lower limb, including hip: Secondary | ICD-10-CM | POA: Diagnosis not present

## 2017-11-12 ENCOUNTER — Telehealth: Payer: Self-pay | Admitting: Pulmonary Disease

## 2017-11-12 ENCOUNTER — Encounter: Payer: Self-pay | Admitting: Pulmonary Disease

## 2017-11-12 ENCOUNTER — Ambulatory Visit: Payer: PPO | Admitting: Pulmonary Disease

## 2017-11-12 VITALS — BP 118/76 | HR 78 | Ht 63.0 in | Wt 168.0 lb

## 2017-11-12 DIAGNOSIS — R1310 Dysphagia, unspecified: Secondary | ICD-10-CM

## 2017-11-12 MED ORDER — FLUTTER DEVI: 1.0000 | Freq: Once | 0 refills | Status: AC

## 2017-11-12 MED ORDER — SODIUM CHLORIDE 3 % IN NEBU: INHALATION_SOLUTION | Freq: Two times a day (BID) | RESPIRATORY_TRACT | 11 refills | Status: DC

## 2017-11-12 NOTE — Telephone Encounter (Signed)
Called and spoke with patient, she stated that she would go and pick up medication that was sent to pharmacy for nebulizer machine. She also stated that if her nebulizer does not work she will call us. Nothing further needed.

## 2017-11-12 NOTE — Patient Instructions (Signed)
Bronchiectasis: This term means that your airways are bigger and more dilated than normal You are more susceptible to infection because of this Makes it harder for you to clear mucus out of your chest We will have you use a flutter valve 4 to 5 breaths, 4-5 times a day as well as hypertonic saline nebulized twice a day to help clear mucus out on your schedule. If you still feel chest congestion and shortness of breath on the next visit we can consider a therapy vest  COPD: Continue Anoro 1 puff daily Get a high-dose flu shot when available Practice good hand hygiene Stay active  Dysphasia: We will check a barium swallow test  We will see you back in 4-6 weeks or sooner if needed
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
## 2017-11-22 ENCOUNTER — Ambulatory Visit (HOSPITAL_COMMUNITY)
Admission: RE | Admit: 2017-11-22 | Discharge: 2017-11-22 | Disposition: A | Discharge status: Home / Self Care | Payer: PPO | Source: Ambulatory Visit | Attending: Pulmonary Disease | Admitting: Pulmonary Disease

## 2017-11-22 DIAGNOSIS — R1310 Dysphagia, unspecified: Secondary | ICD-10-CM | POA: Insufficient documentation

## 2017-11-22 DIAGNOSIS — K449 Diaphragmatic hernia without obstruction or gangrene: Secondary | ICD-10-CM | POA: Insufficient documentation

## 2017-11-29 ENCOUNTER — Telehealth: Payer: Self-pay | Admitting: Pulmonary Disease

## 2017-11-29 NOTE — Telephone Encounter (Signed)
Attempted to call patient back, she was not home, left message for her to call back.   See below result note.   Notes recorded by Dolores Lory, RN on 11/28/2017 at 5:03 PM EDT Attempted to call patient, went to voicemail, left message to call back. Letter sent to patient. ------  Notes recorded by Valerie Salts, CMA on 11/26/2017 at 3:52 PM EDT ATC patient. Call went straight to VM. Left message for patient to call back for results. ------  Notes recorded by Dolores Lory, RN on 11/25/2017 at 8:58 AM EDT Attempted to call patient, no answer, message left to call back. ------  Notes recorded by Juanito Doom, MD on 11/25/2017 at 7:46 AM EDT BJ, Please let the patient know this showed Small hiatal hernia. Otherwise normal esophagram, as detailed above. Thanks, B

## 2017-11-29 NOTE — Telephone Encounter (Signed)
Spoke with pt and notified of results per Dr.McQuaid. Pt verbalized understanding and denied any questions. 

## 2017-11-29 NOTE — Telephone Encounter (Signed)
Pt is calling back  [phone]

## 2017-12-16 DIAGNOSIS — I10 Essential (primary) hypertension: Secondary | ICD-10-CM | POA: Diagnosis not present

## 2017-12-16 DIAGNOSIS — Z23 Encounter for immunization: Secondary | ICD-10-CM | POA: Diagnosis not present

## 2017-12-16 DIAGNOSIS — K219 Gastro-esophageal reflux disease without esophagitis: Secondary | ICD-10-CM | POA: Diagnosis not present

## 2017-12-16 DIAGNOSIS — M5412 Radiculopathy, cervical region: Secondary | ICD-10-CM | POA: Diagnosis not present

## 2017-12-16 DIAGNOSIS — E038 Other specified hypothyroidism: Secondary | ICD-10-CM | POA: Diagnosis not present

## 2017-12-16 DIAGNOSIS — M1990 Unspecified osteoarthritis, unspecified site: Secondary | ICD-10-CM | POA: Diagnosis not present

## 2017-12-16 DIAGNOSIS — E7849 Other hyperlipidemia: Secondary | ICD-10-CM | POA: Diagnosis not present

## 2017-12-16 DIAGNOSIS — R0609 Other forms of dyspnea: Secondary | ICD-10-CM | POA: Diagnosis not present

## 2017-12-16 DIAGNOSIS — K589 Irritable bowel syndrome without diarrhea: Secondary | ICD-10-CM | POA: Diagnosis not present

## 2017-12-16 DIAGNOSIS — M109 Gout, unspecified: Secondary | ICD-10-CM | POA: Diagnosis not present

## 2017-12-16 DIAGNOSIS — M3500 Sicca syndrome, unspecified: Secondary | ICD-10-CM | POA: Diagnosis not present

## 2017-12-16 DIAGNOSIS — R591 Generalized enlarged lymph nodes: Secondary | ICD-10-CM | POA: Diagnosis not present

## 2017-12-18 ENCOUNTER — Encounter: Payer: Self-pay | Admitting: Pulmonary Disease

## 2017-12-18 ENCOUNTER — Ambulatory Visit (INDEPENDENT_AMBULATORY_CARE_PROVIDER_SITE_OTHER): Payer: PPO | Admitting: Pulmonary Disease

## 2017-12-18 DIAGNOSIS — J479 Bronchiectasis, uncomplicated: Secondary | ICD-10-CM

## 2017-12-18 DIAGNOSIS — J449 Chronic obstructive pulmonary disease, unspecified: Secondary | ICD-10-CM | POA: Diagnosis not present

## 2017-12-18 NOTE — Assessment & Plan Note (Signed)
Improved with Hypertonic saline nebulizer treatments twice daily Continue flutter valve x3/day and prn

## 2017-12-18 NOTE — Patient Instructions (Addendum)
Continue Anoro 1 puff daily and Hypertonic nebulizer solution twice daily Flutter valve three times a day and as needed for congestion  Follow up in 4 months with Dr. Lake Bells or NP or sooner if symptoms exacerbate

## 2017-12-18 NOTE — Assessment & Plan Note (Addendum)
Improved; Continues Anoro 1 puff daily  Albuterol hfa q4-6 hrs prn sob/wheeze FU in 4 months
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
## 2018-01-07 DIAGNOSIS — C44729 Squamous cell carcinoma of skin of left lower limb, including hip: Secondary | ICD-10-CM | POA: Diagnosis not present

## 2018-02-17 ENCOUNTER — Other Ambulatory Visit: Payer: Self-pay | Admitting: Pulmonary Disease

## 2018-03-21 ENCOUNTER — Ambulatory Visit (HOSPITAL_BASED_OUTPATIENT_CLINIC_OR_DEPARTMENT_OTHER)
Admission: RE | Admit: 2018-03-21 | Discharge: 2018-03-21 | Disposition: A | Discharge status: Home / Self Care | Payer: PPO | Source: Ambulatory Visit | Attending: Specialist | Admitting: Specialist

## 2018-03-21 ENCOUNTER — Other Ambulatory Visit (HOSPITAL_BASED_OUTPATIENT_CLINIC_OR_DEPARTMENT_OTHER): Payer: Self-pay | Admitting: Specialist

## 2018-03-21 DIAGNOSIS — M869 Osteomyelitis, unspecified: Secondary | ICD-10-CM | POA: Insufficient documentation

## 2018-03-21 DIAGNOSIS — M19041 Primary osteoarthritis, right hand: Secondary | ICD-10-CM | POA: Diagnosis not present

## 2018-03-21 DIAGNOSIS — L089 Local infection of the skin and subcutaneous tissue, unspecified: Secondary | ICD-10-CM | POA: Diagnosis not present

## 2018-03-26 DIAGNOSIS — L601 Onycholysis: Secondary | ICD-10-CM | POA: Diagnosis not present

## 2018-04-09 DIAGNOSIS — Z6828 Body mass index (BMI) 28.0-28.9, adult: Secondary | ICD-10-CM | POA: Diagnosis not present

## 2018-04-09 DIAGNOSIS — E038 Other specified hypothyroidism: Secondary | ICD-10-CM | POA: Diagnosis not present

## 2018-04-09 DIAGNOSIS — R42 Dizziness and giddiness: Secondary | ICD-10-CM | POA: Diagnosis not present

## 2018-04-09 DIAGNOSIS — I10 Essential (primary) hypertension: Secondary | ICD-10-CM | POA: Diagnosis not present

## 2018-04-16 DIAGNOSIS — M2550 Pain in unspecified joint: Secondary | ICD-10-CM | POA: Diagnosis not present

## 2018-04-16 DIAGNOSIS — M3500 Sicca syndrome, unspecified: Secondary | ICD-10-CM | POA: Diagnosis not present

## 2018-04-16 DIAGNOSIS — R0609 Other forms of dyspnea: Secondary | ICD-10-CM | POA: Diagnosis not present

## 2018-04-16 DIAGNOSIS — M150 Primary generalized (osteo)arthritis: Secondary | ICD-10-CM | POA: Diagnosis not present

## 2018-04-16 DIAGNOSIS — E663 Overweight: Secondary | ICD-10-CM | POA: Diagnosis not present

## 2018-04-16 DIAGNOSIS — Z6828 Body mass index (BMI) 28.0-28.9, adult: Secondary | ICD-10-CM | POA: Diagnosis not present

## 2018-04-16 DIAGNOSIS — I7300 Raynaud's syndrome without gangrene: Secondary | ICD-10-CM | POA: Diagnosis not present

## 2018-04-21 DIAGNOSIS — H524 Presbyopia: Secondary | ICD-10-CM | POA: Diagnosis not present

## 2018-04-21 DIAGNOSIS — H04123 Dry eye syndrome of bilateral lacrimal glands: Secondary | ICD-10-CM | POA: Diagnosis not present

## 2018-04-21 DIAGNOSIS — Z961 Presence of intraocular lens: Secondary | ICD-10-CM | POA: Diagnosis not present

## 2018-05-07 ENCOUNTER — Telehealth: Payer: Self-pay | Admitting: Pulmonary Disease

## 2018-05-07 NOTE — Telephone Encounter (Signed)
Left message for patient to call back  

## 2018-05-09 NOTE — Telephone Encounter (Signed)
Spoke with pt. She is not needing a refill on her Hypertonic neb solution. Pt is wanting an alternative that is not as expensive. After calling us the pt decided that she would wait until her appointment to speak to BQ about this. Nothing further was needed.

## 2018-05-14 ENCOUNTER — Encounter: Payer: Self-pay | Admitting: Pulmonary Disease

## 2018-05-14 ENCOUNTER — Ambulatory Visit: Payer: PPO | Admitting: Pulmonary Disease

## 2018-05-14 VITALS — BP 130/70 | HR 89 | Ht 63.0 in | Wt 161.6 lb

## 2018-05-14 DIAGNOSIS — J449 Chronic obstructive pulmonary disease, unspecified: Secondary | ICD-10-CM

## 2018-05-14 DIAGNOSIS — R1310 Dysphagia, unspecified: Secondary | ICD-10-CM | POA: Diagnosis not present

## 2018-05-14 DIAGNOSIS — M3500 Sicca syndrome, unspecified: Secondary | ICD-10-CM | POA: Diagnosis not present

## 2018-05-14 DIAGNOSIS — J479 Bronchiectasis, uncomplicated: Secondary | ICD-10-CM

## 2018-05-14 MED ORDER — SODIUM CHLORIDE 3 % IN NEBU: INHALATION_SOLUTION | Freq: Two times a day (BID) | RESPIRATORY_TRACT | 11 refills | Status: DC

## 2018-05-14 NOTE — Patient Instructions (Signed)
COPD: Continue Anoro 1 puff daily no matter how you feel Use albuterol as needed for chest tightness wheezing or shortness of breath Practice good hand hygiene Stay active I am glad your flu shot is up-to-date  Bronchiectasis: It is important for you to continue using the flutter valve 10 breaths twice a day Continue using hypertonic saline nebulized twice a day Call me if you have increasing chest congestion mucus production or shortness of breath  We will see you back in 4 to 6 months or sooner if needed
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
## 2018-06-16 DIAGNOSIS — M109 Gout, unspecified: Secondary | ICD-10-CM | POA: Diagnosis not present

## 2018-06-16 DIAGNOSIS — I10 Essential (primary) hypertension: Secondary | ICD-10-CM | POA: Diagnosis not present

## 2018-06-16 DIAGNOSIS — E7849 Other hyperlipidemia: Secondary | ICD-10-CM | POA: Diagnosis not present

## 2018-06-16 DIAGNOSIS — E038 Other specified hypothyroidism: Secondary | ICD-10-CM | POA: Diagnosis not present

## 2018-06-17 DIAGNOSIS — M3500 Sicca syndrome, unspecified: Secondary | ICD-10-CM | POA: Diagnosis not present

## 2018-06-17 DIAGNOSIS — M2550 Pain in unspecified joint: Secondary | ICD-10-CM | POA: Diagnosis not present

## 2018-06-17 DIAGNOSIS — R0609 Other forms of dyspnea: Secondary | ICD-10-CM | POA: Diagnosis not present

## 2018-06-17 DIAGNOSIS — M150 Primary generalized (osteo)arthritis: Secondary | ICD-10-CM | POA: Diagnosis not present

## 2018-06-17 DIAGNOSIS — I7300 Raynaud's syndrome without gangrene: Secondary | ICD-10-CM | POA: Diagnosis not present

## 2018-06-23 DIAGNOSIS — K589 Irritable bowel syndrome without diarrhea: Secondary | ICD-10-CM | POA: Diagnosis not present

## 2018-06-23 DIAGNOSIS — M3500 Sicca syndrome, unspecified: Secondary | ICD-10-CM | POA: Diagnosis not present

## 2018-06-23 DIAGNOSIS — M109 Gout, unspecified: Secondary | ICD-10-CM | POA: Diagnosis not present

## 2018-06-23 DIAGNOSIS — M1990 Unspecified osteoarthritis, unspecified site: Secondary | ICD-10-CM | POA: Diagnosis not present

## 2018-06-23 DIAGNOSIS — N319 Neuromuscular dysfunction of bladder, unspecified: Secondary | ICD-10-CM | POA: Diagnosis not present

## 2018-06-23 DIAGNOSIS — E785 Hyperlipidemia, unspecified: Secondary | ICD-10-CM | POA: Diagnosis not present

## 2018-06-23 DIAGNOSIS — R339 Retention of urine, unspecified: Secondary | ICD-10-CM | POA: Diagnosis not present

## 2018-06-23 DIAGNOSIS — Z1331 Encounter for screening for depression: Secondary | ICD-10-CM | POA: Diagnosis not present

## 2018-06-23 DIAGNOSIS — E039 Hypothyroidism, unspecified: Secondary | ICD-10-CM | POA: Diagnosis not present

## 2018-06-23 DIAGNOSIS — J449 Chronic obstructive pulmonary disease, unspecified: Secondary | ICD-10-CM | POA: Diagnosis not present

## 2018-06-23 DIAGNOSIS — Z0000 Encounter for general adult medical examination without abnormal findings: Secondary | ICD-10-CM | POA: Diagnosis not present

## 2018-06-23 DIAGNOSIS — I10 Essential (primary) hypertension: Secondary | ICD-10-CM | POA: Diagnosis not present

## 2018-06-23 DIAGNOSIS — Z1339 Encounter for screening examination for other mental health and behavioral disorders: Secondary | ICD-10-CM | POA: Diagnosis not present

## 2018-06-23 DIAGNOSIS — M5412 Radiculopathy, cervical region: Secondary | ICD-10-CM | POA: Diagnosis not present

## 2018-06-23 DIAGNOSIS — R0609 Other forms of dyspnea: Secondary | ICD-10-CM | POA: Diagnosis not present

## 2018-06-24 ENCOUNTER — Other Ambulatory Visit: Payer: Self-pay | Admitting: Pulmonary Disease

## 2018-08-25 DIAGNOSIS — L578 Other skin changes due to chronic exposure to nonionizing radiation: Secondary | ICD-10-CM | POA: Diagnosis not present

## 2018-08-25 DIAGNOSIS — L570 Actinic keratosis: Secondary | ICD-10-CM | POA: Diagnosis not present

## 2018-08-25 DIAGNOSIS — D485 Neoplasm of uncertain behavior of skin: Secondary | ICD-10-CM | POA: Diagnosis not present

## 2018-08-26 DIAGNOSIS — C44729 Squamous cell carcinoma of skin of left lower limb, including hip: Secondary | ICD-10-CM | POA: Diagnosis not present

## 2018-08-27 DIAGNOSIS — H5711 Ocular pain, right eye: Secondary | ICD-10-CM | POA: Diagnosis not present

## 2018-08-27 DIAGNOSIS — H5461 Unqualified visual loss, right eye, normal vision left eye: Secondary | ICD-10-CM | POA: Diagnosis not present

## 2018-08-27 DIAGNOSIS — H04123 Dry eye syndrome of bilateral lacrimal glands: Secondary | ICD-10-CM | POA: Diagnosis not present

## 2018-08-28 DIAGNOSIS — Z79899 Other long term (current) drug therapy: Secondary | ICD-10-CM | POA: Diagnosis not present

## 2018-08-28 DIAGNOSIS — H43812 Vitreous degeneration, left eye: Secondary | ICD-10-CM | POA: Diagnosis not present

## 2018-08-28 DIAGNOSIS — Z961 Presence of intraocular lens: Secondary | ICD-10-CM | POA: Diagnosis not present

## 2018-09-08 DIAGNOSIS — H35072 Retinal telangiectasis, left eye: Secondary | ICD-10-CM | POA: Diagnosis not present

## 2018-09-08 DIAGNOSIS — H35359 Cystoid macular degeneration, unspecified eye: Secondary | ICD-10-CM | POA: Diagnosis not present

## 2018-09-08 DIAGNOSIS — H35071 Retinal telangiectasis, right eye: Secondary | ICD-10-CM | POA: Diagnosis not present

## 2018-09-08 DIAGNOSIS — H43812 Vitreous degeneration, left eye: Secondary | ICD-10-CM | POA: Diagnosis not present

## 2018-10-06 DIAGNOSIS — L570 Actinic keratosis: Secondary | ICD-10-CM | POA: Diagnosis not present

## 2018-10-06 DIAGNOSIS — Z85828 Personal history of other malignant neoplasm of skin: Secondary | ICD-10-CM | POA: Diagnosis not present

## 2018-10-06 DIAGNOSIS — L905 Scar conditions and fibrosis of skin: Secondary | ICD-10-CM | POA: Diagnosis not present

## 2018-10-27 DIAGNOSIS — E663 Overweight: Secondary | ICD-10-CM | POA: Diagnosis not present

## 2018-10-27 DIAGNOSIS — M3500 Sicca syndrome, unspecified: Secondary | ICD-10-CM | POA: Diagnosis not present

## 2018-10-27 DIAGNOSIS — M150 Primary generalized (osteo)arthritis: Secondary | ICD-10-CM | POA: Diagnosis not present

## 2018-10-27 DIAGNOSIS — I7300 Raynaud's syndrome without gangrene: Secondary | ICD-10-CM | POA: Diagnosis not present

## 2018-10-27 DIAGNOSIS — Z6828 Body mass index (BMI) 28.0-28.9, adult: Secondary | ICD-10-CM | POA: Diagnosis not present

## 2018-10-27 DIAGNOSIS — R0609 Other forms of dyspnea: Secondary | ICD-10-CM | POA: Diagnosis not present

## 2018-11-08 IMAGING — DX DG CHEST 2V
2 series · 2 of 2 positions shown · non-contrast
Comparison: CT chest dated November 06, 2004.

CLINICAL DATA: Exertional dyspnea for the past 2 months.

EXAM:
CHEST - 2 VIEW

[chest pa]
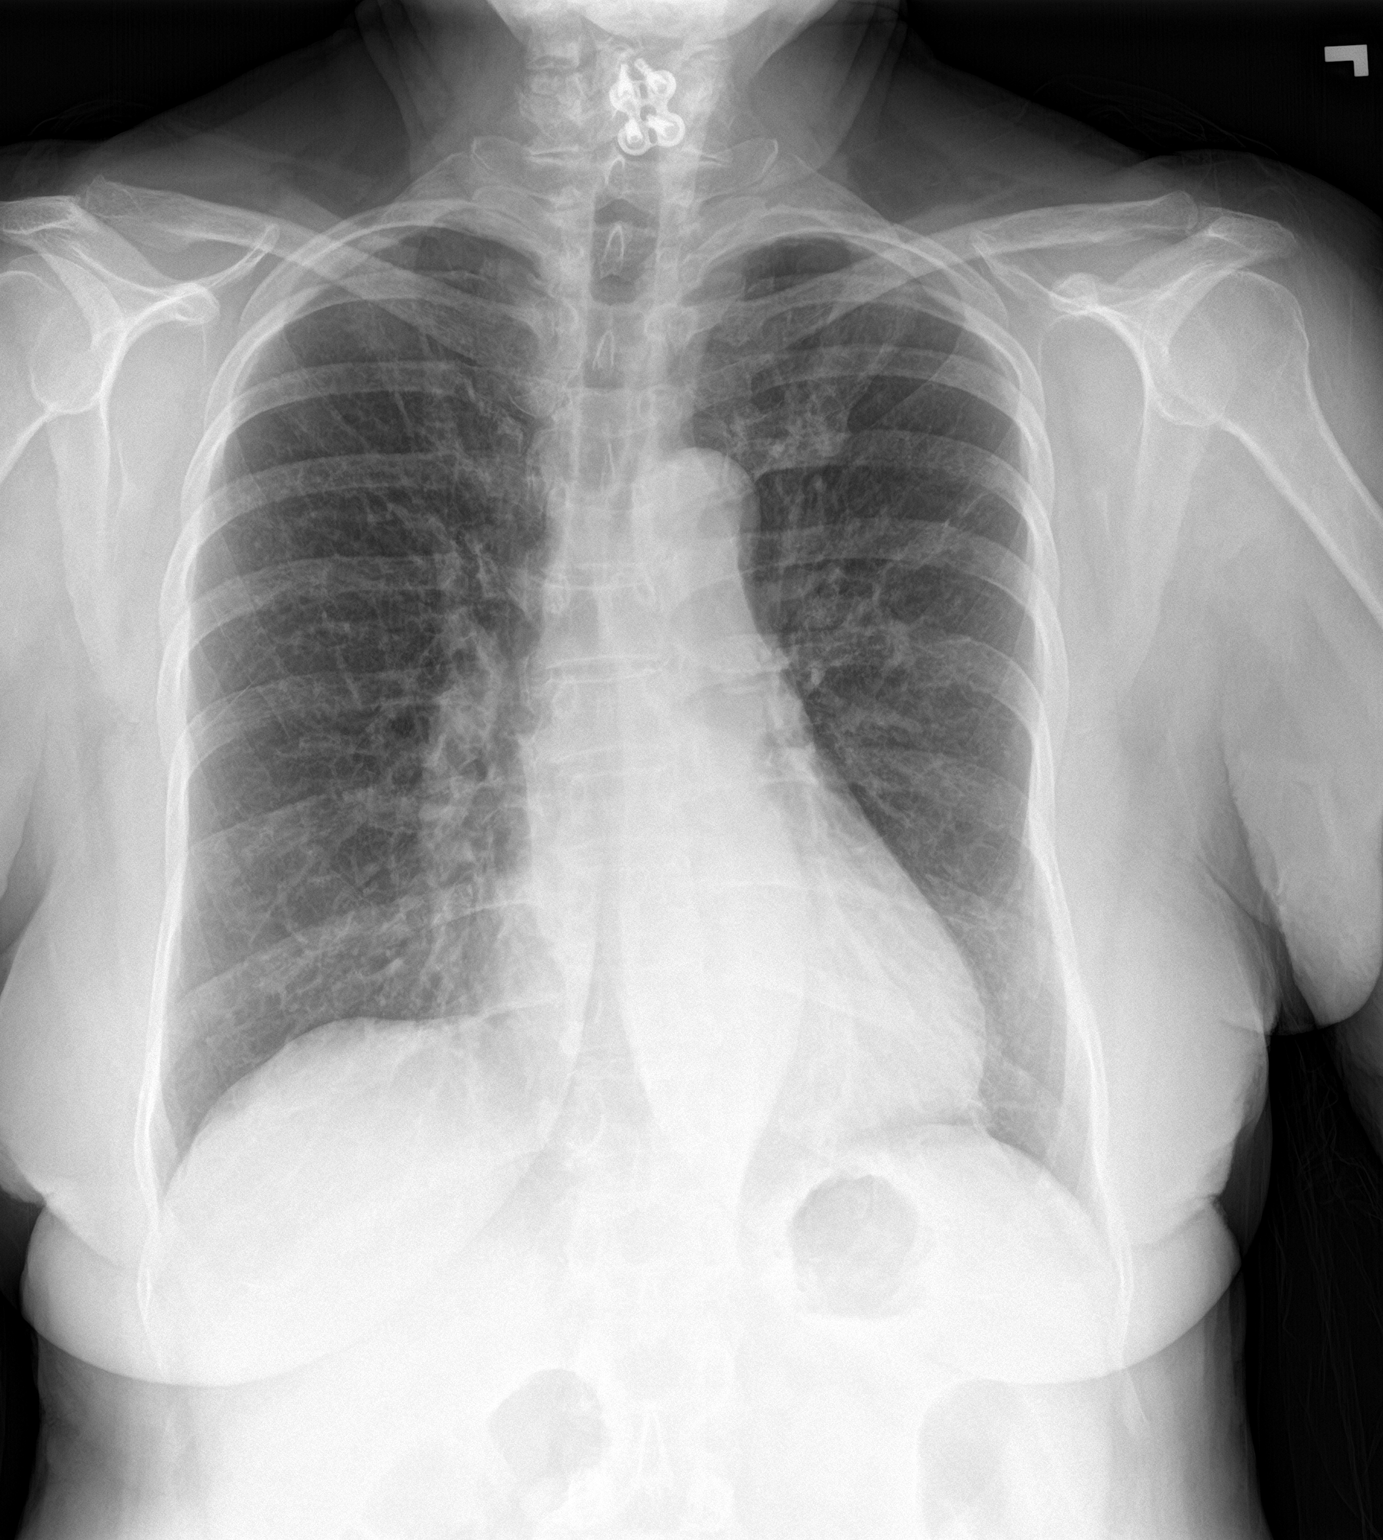

[chest lat]
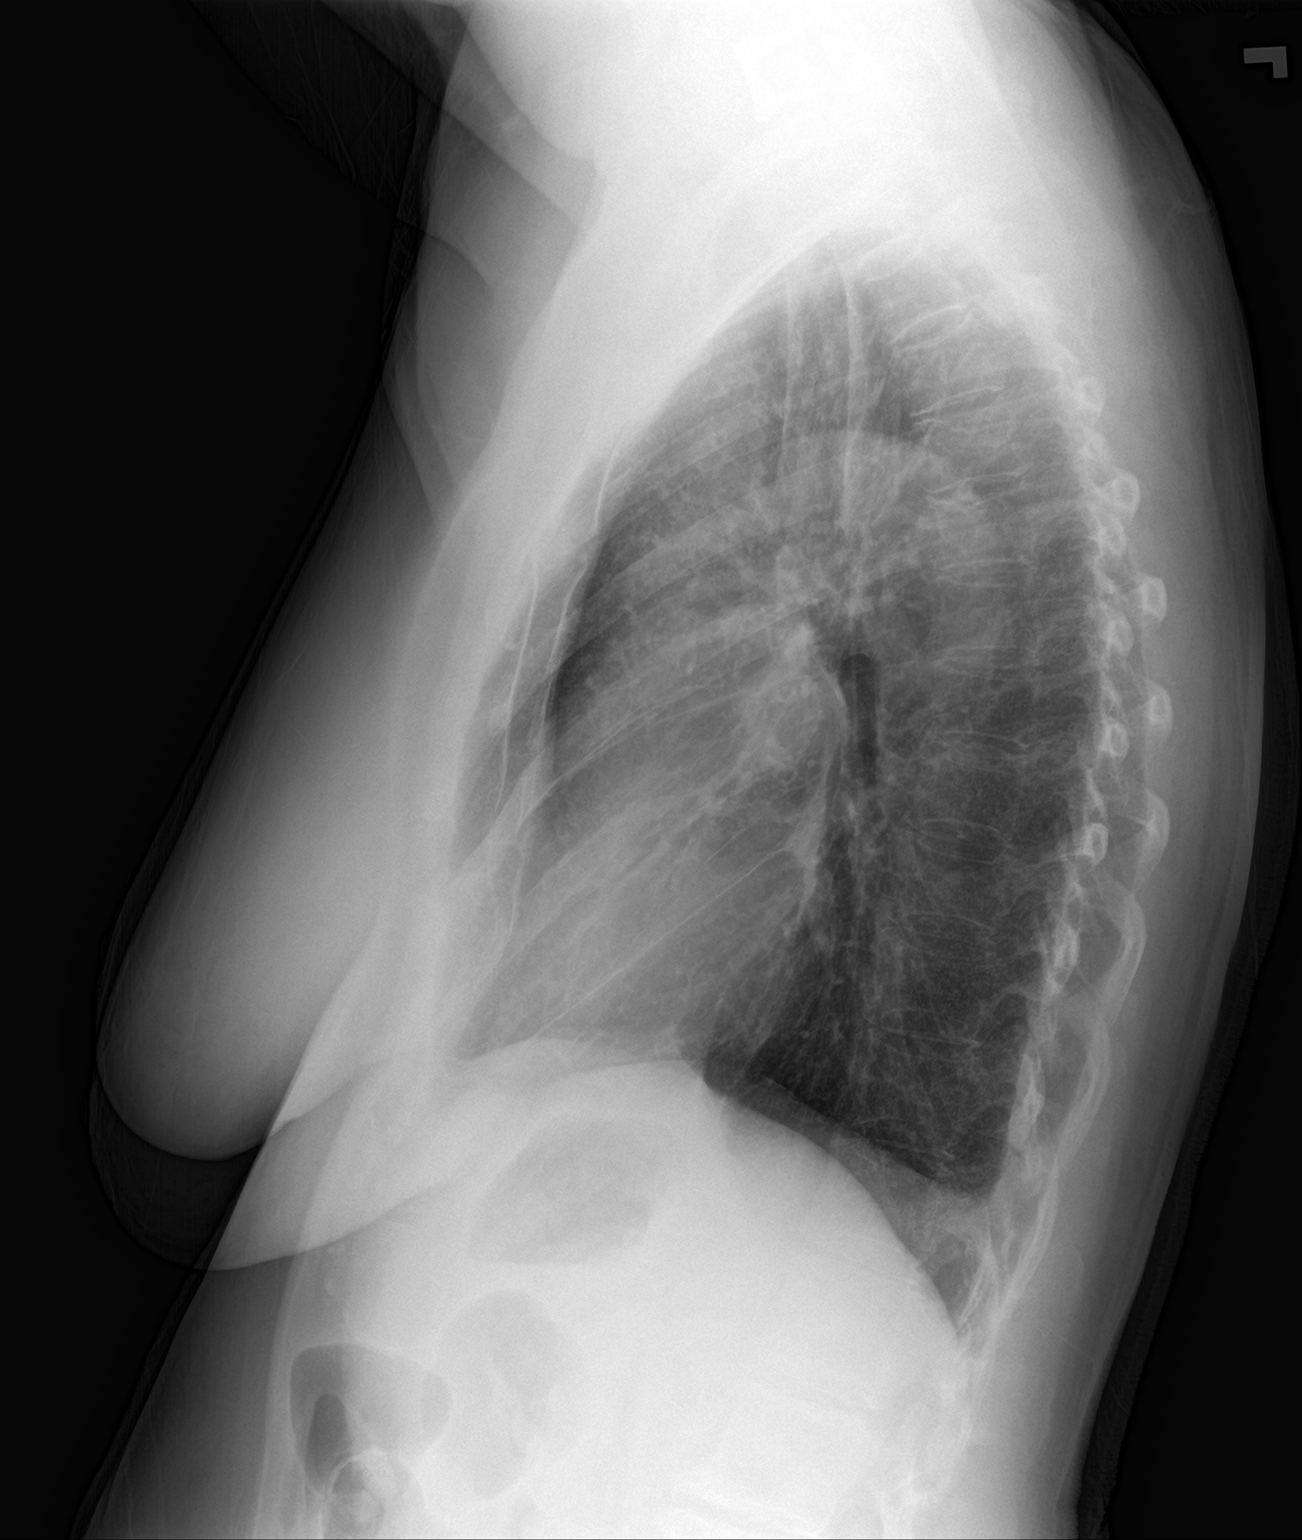

[2 of 2 positions shown; findings below may reference images not displayed]

FINDINGS: The heart size and mediastinal contours are within normal limits.
Normal pulmonary vascularity. No focal consolidation, pleural
effusion, or pneumothorax. No acute osseous abnormality. Lower
cervical fusion hardware.
IMPRESSION: No active cardiopulmonary disease.

## 2018-11-19 ENCOUNTER — Other Ambulatory Visit: Payer: Self-pay | Admitting: Internal Medicine

## 2018-11-19 DIAGNOSIS — Z1231 Encounter for screening mammogram for malignant neoplasm of breast: Secondary | ICD-10-CM

## 2018-11-22 DIAGNOSIS — L040 Acute lymphadenitis of face, head and neck: Secondary | ICD-10-CM | POA: Diagnosis not present

## 2018-11-22 DIAGNOSIS — H6692 Otitis media, unspecified, left ear: Secondary | ICD-10-CM | POA: Diagnosis not present

## 2018-12-08 DIAGNOSIS — H35071 Retinal telangiectasis, right eye: Secondary | ICD-10-CM | POA: Diagnosis not present

## 2018-12-08 DIAGNOSIS — H43812 Vitreous degeneration, left eye: Secondary | ICD-10-CM | POA: Diagnosis not present

## 2018-12-08 DIAGNOSIS — H35359 Cystoid macular degeneration, unspecified eye: Secondary | ICD-10-CM | POA: Diagnosis not present

## 2018-12-08 DIAGNOSIS — H35072 Retinal telangiectasis, left eye: Secondary | ICD-10-CM | POA: Diagnosis not present

## 2018-12-19 DIAGNOSIS — Z23 Encounter for immunization: Secondary | ICD-10-CM | POA: Diagnosis not present

## 2018-12-24 DIAGNOSIS — R591 Generalized enlarged lymph nodes: Secondary | ICD-10-CM | POA: Diagnosis not present

## 2018-12-24 DIAGNOSIS — E038 Other specified hypothyroidism: Secondary | ICD-10-CM | POA: Diagnosis not present

## 2018-12-24 DIAGNOSIS — K219 Gastro-esophageal reflux disease without esophagitis: Secondary | ICD-10-CM | POA: Diagnosis not present

## 2018-12-24 DIAGNOSIS — K589 Irritable bowel syndrome without diarrhea: Secondary | ICD-10-CM | POA: Diagnosis not present

## 2018-12-24 DIAGNOSIS — Z23 Encounter for immunization: Secondary | ICD-10-CM | POA: Diagnosis not present

## 2018-12-24 DIAGNOSIS — M5412 Radiculopathy, cervical region: Secondary | ICD-10-CM | POA: Diagnosis not present

## 2018-12-24 DIAGNOSIS — R0609 Other forms of dyspnea: Secondary | ICD-10-CM | POA: Diagnosis not present

## 2018-12-24 DIAGNOSIS — M109 Gout, unspecified: Secondary | ICD-10-CM | POA: Diagnosis not present

## 2018-12-24 DIAGNOSIS — J449 Chronic obstructive pulmonary disease, unspecified: Secondary | ICD-10-CM | POA: Diagnosis not present

## 2018-12-24 DIAGNOSIS — E039 Hypothyroidism, unspecified: Secondary | ICD-10-CM | POA: Diagnosis not present

## 2018-12-24 DIAGNOSIS — I10 Essential (primary) hypertension: Secondary | ICD-10-CM | POA: Diagnosis not present

## 2018-12-24 DIAGNOSIS — E785 Hyperlipidemia, unspecified: Secondary | ICD-10-CM | POA: Diagnosis not present

## 2019-01-02 ENCOUNTER — Ambulatory Visit
Admission: RE | Admit: 2019-01-02 | Discharge: 2019-01-02 | Disposition: A | Discharge status: Home / Self Care | Payer: PPO | Source: Ambulatory Visit | Attending: Internal Medicine | Admitting: Internal Medicine

## 2019-01-02 ENCOUNTER — Other Ambulatory Visit: Payer: Self-pay

## 2019-01-02 DIAGNOSIS — Z1231 Encounter for screening mammogram for malignant neoplasm of breast: Secondary | ICD-10-CM | POA: Diagnosis not present

## 2019-01-07 DIAGNOSIS — L578 Other skin changes due to chronic exposure to nonionizing radiation: Secondary | ICD-10-CM | POA: Diagnosis not present

## 2019-01-07 DIAGNOSIS — L989 Disorder of the skin and subcutaneous tissue, unspecified: Secondary | ICD-10-CM | POA: Diagnosis not present

## 2019-01-07 DIAGNOSIS — D485 Neoplasm of uncertain behavior of skin: Secondary | ICD-10-CM | POA: Diagnosis not present

## 2019-01-15 IMAGING — CT CT CHEST HIGH RESOLUTION W/O CM
2 of 5 series · 15 of 36 positions shown, 18 images · non-contrast
Comparison: Chest CT 11/07/2014.

CLINICAL DATA: 77-year-old female with history of chronic cough
worsening since [REDACTED] with shortness of breath. Recently diagnosed
with COPD. Nonsmoker. History of [place] disease.

EXAM:
CT CHEST WITHOUT CONTRAST
TECHNIQUE: Multidetector CT imaging of the chest was performed following the
standard protocol without intravenous contrast. High resolution
imaging of the lungs, as well as inspiratory and expiratory imaging,
was performed.

[Series 2: thorax · axial · 0.63mm/px · z∈[-270,+2]mm · 12 of 152 slices shown, 15 images]
[im 8/152  mediastinal]
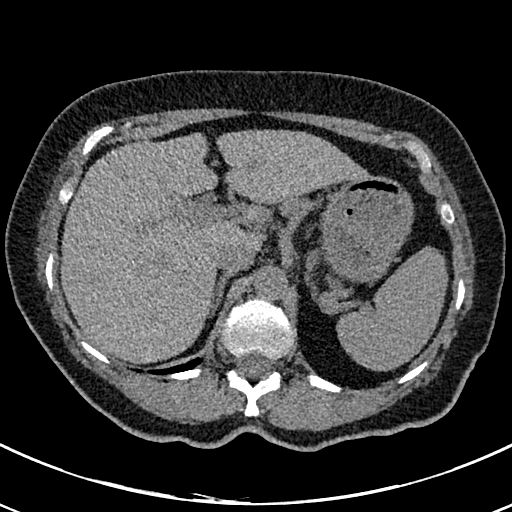
[im 8/152  lung]
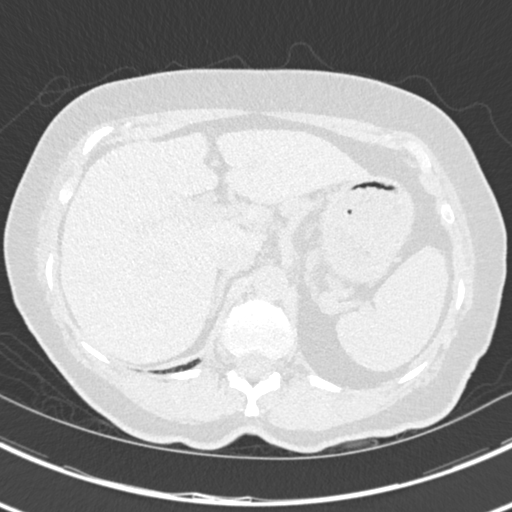
[im 22/152  lung]
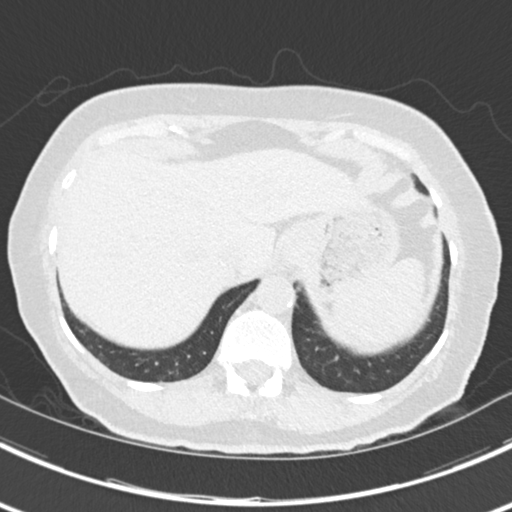
[im 36/152  lung]
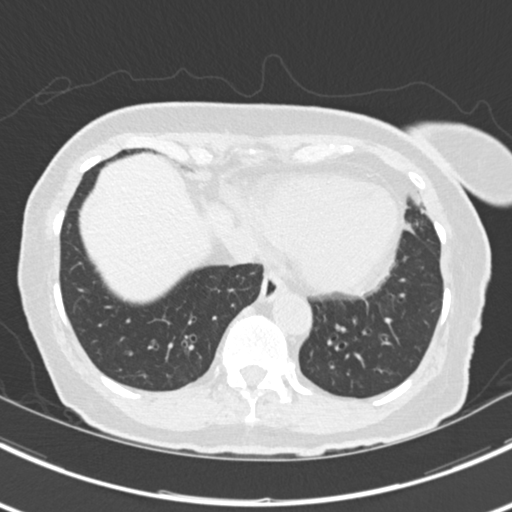
[im 44/152  lung]
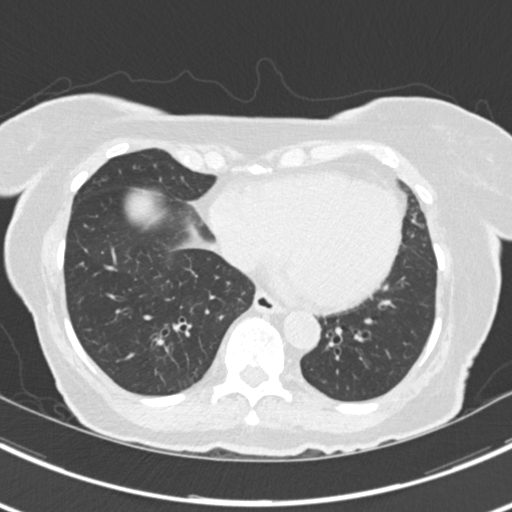
[im 58/152  mediastinal]
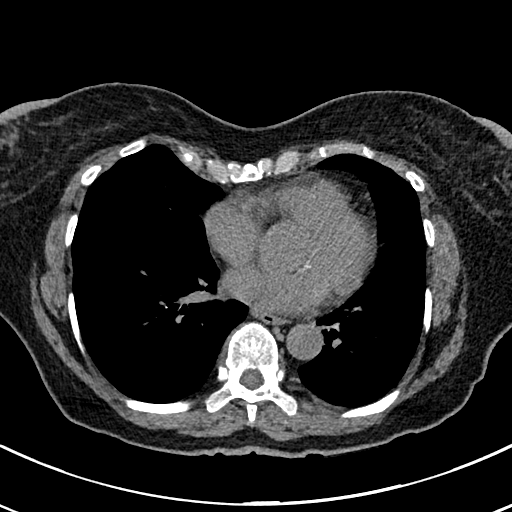
[im 58/152  lung]
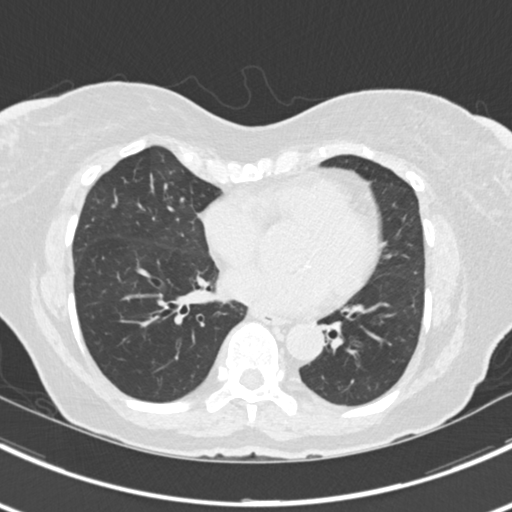
[im 72/152  lung]
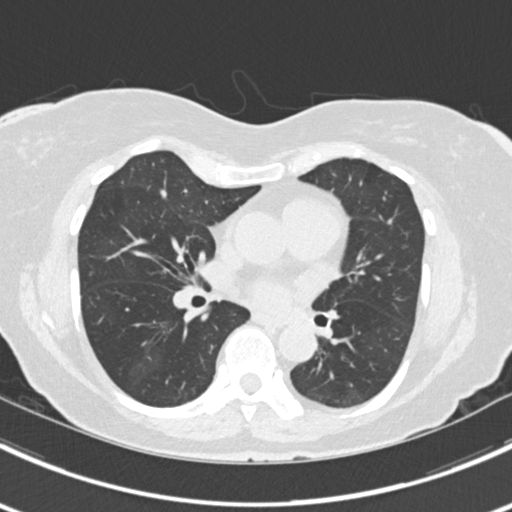
[im 80/152  lung]
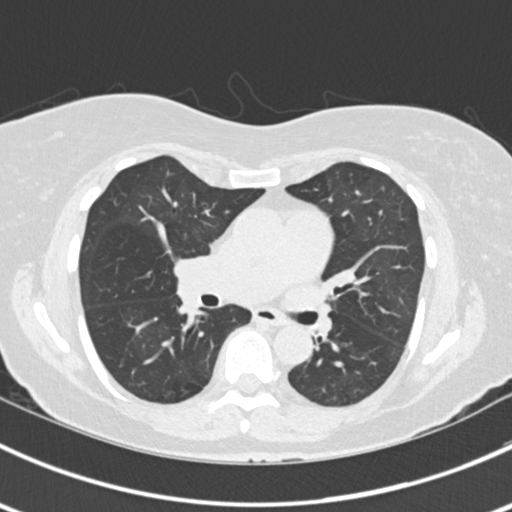
[im 94/152  lung]
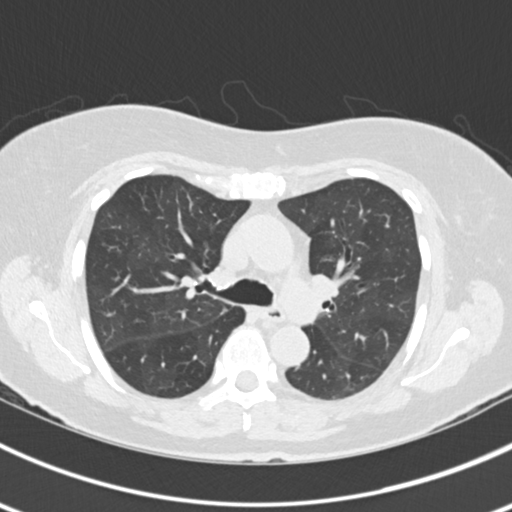
[im 108/152  mediastinal]
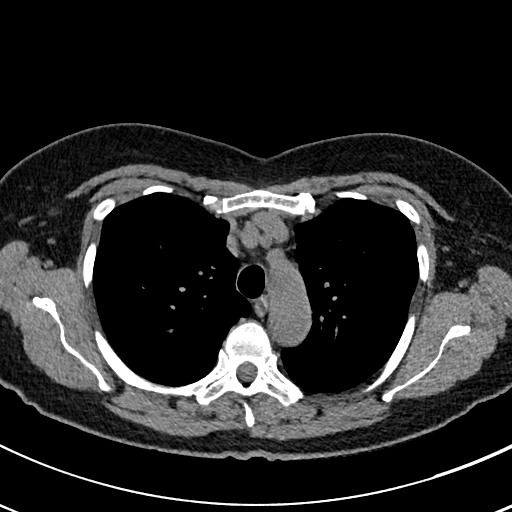
[im 108/152  lung]
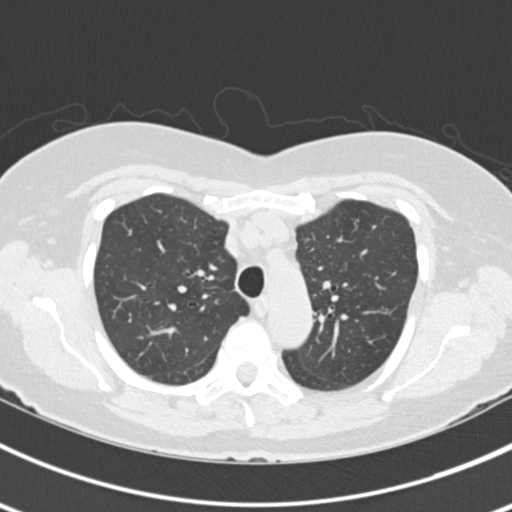
[im 116/152  lung]
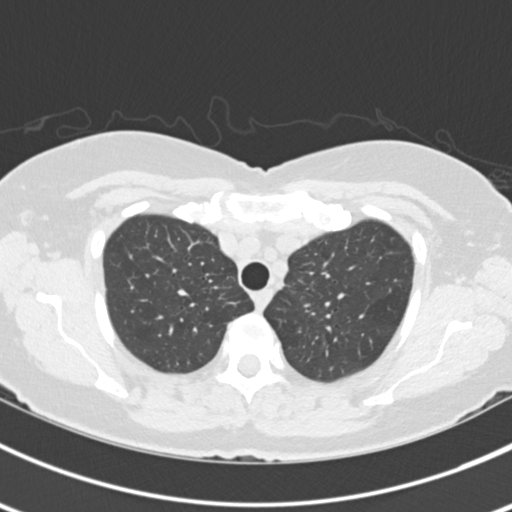
[im 130/152  lung]
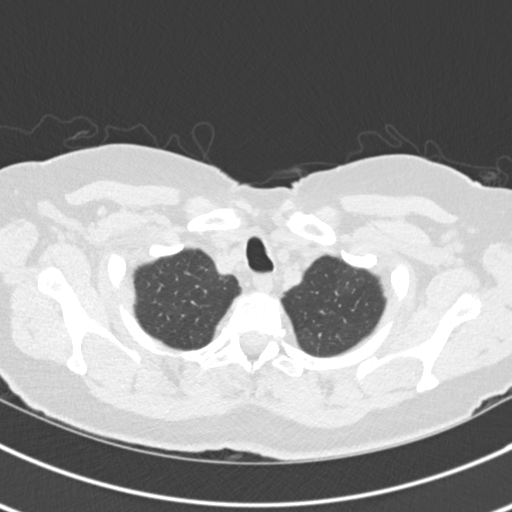
[im 144/152  lung]
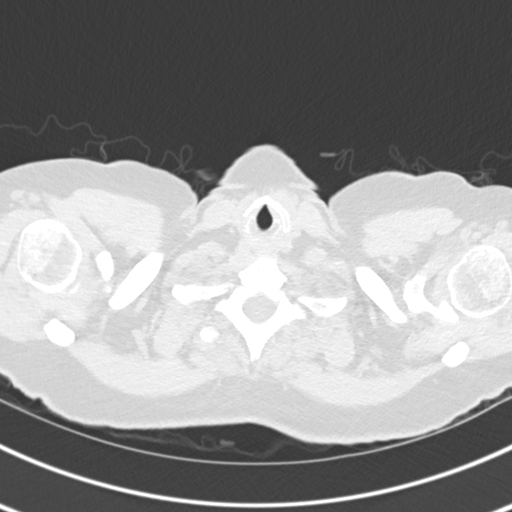

[Series 8: coronal · coronal · 0.66mm/px · 3 of 134 slices shown]
[im 27/134  lung]
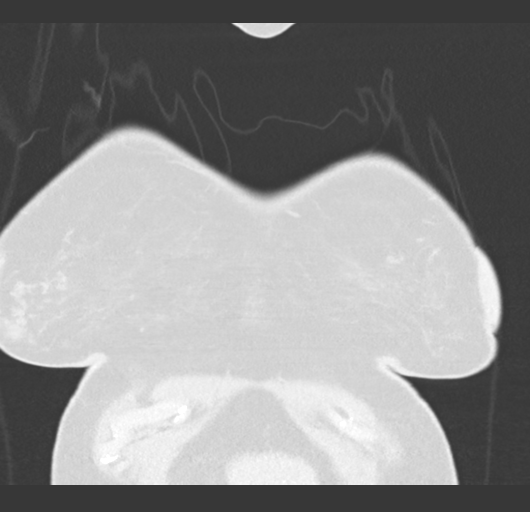
[im 54/134  lung]
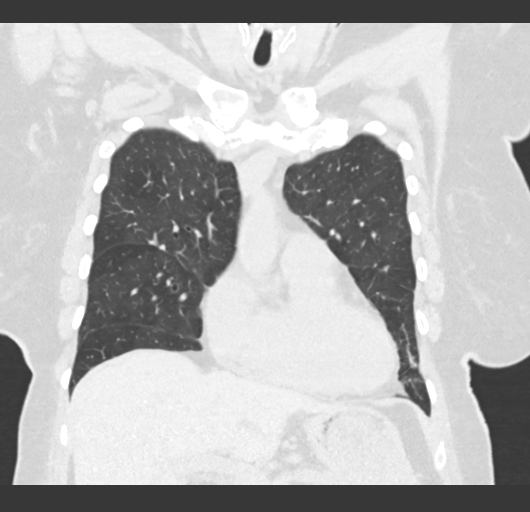
[im 80/134  lung]
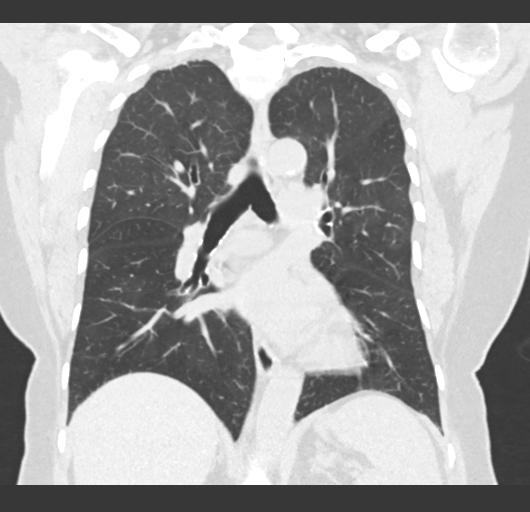

[15 of 36 positions shown; findings below may reference images not displayed]

FINDINGS: Cardiovascular: Heart size is normal. There is no significant
pericardial fluid, thickening or pericardial calcification. There is
aortic atherosclerosis, as well as atherosclerosis of the great
vessels of the mediastinum and the coronary arteries, including
calcified atherosclerotic plaque in the left anterior descending and
right coronary arteries.

Mediastinum/Nodes: No pathologically enlarged mediastinal or hilar
lymph nodes. Please note that accurate exclusion of hilar adenopathy
is limited on noncontrast CT scans. Esophagus is unremarkable in
appearance. No axillary lymphadenopathy.

Lungs/Pleura: High-resolution images demonstrate diffuse bronchial
wall thickening with relatively diffuse but mild cylindrical
bronchiectasis throughout all aspects of the lungs. No significant
regions of ground-glass attenuation, subpleural reticulation,
parenchymal banding or honeycombing is identified. Inspiratory and
expiratory imaging demonstrates mild air trapping indicative of
small airways disease. No acute consolidative airspace disease. No
pleural effusions. 4 mm right upper lobe pulmonary nodule (axial
image 57 of series 5). No other larger more suspicious appearing
pulmonary nodules or masses are noted.

Upper Abdomen: Aortic atherosclerosis.

Musculoskeletal: There are no aggressive appearing lytic or blastic
lesions noted in the visualized portions of the skeleton. Mild
pectus excavatum.
IMPRESSION: 1. Mild cylindrical bronchiectasis noted diffusely throughout the
lungs bilaterally.
2. Evidence of mild air trapping indicative of small airways
disease.
3. No overt imaging findings to suggest interstitial lung disease at
this time.
4. Aortic atherosclerosis, in addition to 2 vessel coronary artery
disease. Please note that although the presence of coronary artery
calcium documents the presence of coronary artery disease, the
severity of this disease and any potential stenosis cannot be
assessed on this non-gated CT examination. Assessment for potential
risk factor modification, dietary therapy or pharmacologic therapy
may be warranted, if clinically indicated.

Aortic Atherosclerosis ([id number]).

## 2019-01-28 ENCOUNTER — Ambulatory Visit (INDEPENDENT_AMBULATORY_CARE_PROVIDER_SITE_OTHER): Payer: PPO | Admitting: Pulmonary Disease

## 2019-01-28 ENCOUNTER — Encounter: Payer: Self-pay | Admitting: Pulmonary Disease

## 2019-01-28 ENCOUNTER — Other Ambulatory Visit: Payer: Self-pay

## 2019-01-28 VITALS — BP 124/86 | HR 83 | Temp 97.6°F | Ht 63.0 in | Wt 165.2 lb

## 2019-01-28 DIAGNOSIS — M3500 Sicca syndrome, unspecified: Secondary | ICD-10-CM

## 2019-01-28 DIAGNOSIS — R1310 Dysphagia, unspecified: Secondary | ICD-10-CM

## 2019-01-28 DIAGNOSIS — J479 Bronchiectasis, uncomplicated: Secondary | ICD-10-CM | POA: Diagnosis not present

## 2019-01-28 DIAGNOSIS — J449 Chronic obstructive pulmonary disease, unspecified: Secondary | ICD-10-CM

## 2019-01-28 MED ORDER — SODIUM CHLORIDE 3 % IN NEBU: INHALATION_SOLUTION | Freq: Two times a day (BID) | RESPIRATORY_TRACT | 11 refills | Status: AC

## 2019-01-28 NOTE — Patient Instructions (Addendum)
Thank you for visiting Dr. Valeta Harms at [facility]. Today we recommend the following:  Continue your current inhaler regimen and nebulizer. New orders for nebulizer tubing and accessories.  Please call with symptoms or questions.  Return in about 1 year (around 01/28/2020) for with APP or Dr. Valeta Harms.    Please do your part to reduce the spread of COVID-19.

## 2019-01-28 NOTE — Progress Notes (Signed)
Synopsis: Referred in November 2020 former patient Dr. Lake Bells, bronchiectasis.  PCP: Arman Filter, FNP  Subjective:   PATIENT ID: Aimee Brown GENDER: female DOB: 1940/09/04, MRN: [id number]  Chief Complaint  Patient presents with  . Follow-up    Former patient of [facility]. She reports her breathing has been at her baseline. Using albuterol PRN and Hypertonic saline BID.     This is a 78 year old female past medical history of hyperlipidemia, hypertension former patient Dr. Lake Bells followed for bronchiectasis and COPD.  Pulmonary function test June 2019 revealed severe airflow obstruction, FEV1 1.0 L, 48% predicted, no significant bronchodilator response, RV/TLC ratio 163%, DLCO 54%.  Patient is a lifelong non-smoker.  She does have secondhand smoke exposure from a previous job.  Patient has been doing well from a respiratory standpoint. She does still get dyspneic on exertion going from the grocery store to the car.  She will have to find sometimes a place to sit and rest.  She has been using her Anoro Ellipta regularly.  She uses her nebulizer regularly.  She does need new tubing and accessories for her nebulizer machine.  She has a crack in the cup and it leaks currently.  Patient does not have any significant sputum production, fevers chills night sweats weight loss.  She uses her flutter valve to help clear her airway.  She feels that her postural drainage as well as a flutter valve is working well for her at this time.   Past Medical History:  Diagnosis Date  . Depression   . Hypercholesteremia   . Hypertension      Family History  Problem Relation Age of Onset  . Breast cancer Paternal Aunt   . Breast cancer Maternal Grandmother   . Heart disease Mother   . Heart disease Father   . Scleroderma Brother      Past Surgical History:  Procedure Laterality Date  . ABDOMINAL HYSTERECTOMY    . CERVICAL LAMINECTOMY    . CESAREAN SECTION    . KIDNEY STONE SURGERY    . LEFT  HEART CATH AND CORONARY ANGIOGRAPHY N/A 07/10/2017   Procedure: LEFT HEART CATH AND CORONARY ANGIOGRAPHY;  Surgeon: Leonie Man, MD;  Location: [facility];  Service: Cardiovascular;  Laterality: N/A;  . SALPINGOOPHORECTOMY    . TONSILLECTOMY      Social History   Socioeconomic History  . Marital status: Married    Spouse name: Not on file  . Number of children: Not on file  . Years of education: Not on file  . Highest education level: Not on file  Occupational History  . Not on file  Social Needs  . Financial resource strain: Not on file  . Food insecurity    Worry: Not on file    Inability: Not on file  . Transportation needs    Medical: Not on file    Non-medical: Not on file  Tobacco Use  . Smoking status: Never Smoker  . Smokeless tobacco: Never Used  Substance and Sexual Activity  . Alcohol use: No  . Drug use: Never  . Sexual activity: Not on file  Lifestyle  . Physical activity    Days per week: Not on file    Minutes per session: Not on file  . Stress: Not on file  Relationships  . Social Herbalist on phone: Not on file    Gets together: Not on file    Attends religious service: Not on  file    Active member of club or organization: Not on file    Attends meetings of clubs or organizations: Not on file    Relationship status: Not on file  . Intimate partner violence    Fear of current or ex partner: Not on file    Emotionally abused: Not on file    Physically abused: Not on file    Forced sexual activity: Not on file  Other Topics Concern  . Not on file  Social History Narrative  . Not on file     No Known Allergies   Outpatient Medications Prior to Visit  Medication Sig Dispense Refill  . albuterol (PROVENTIL HFA;VENTOLIN HFA) 108 (90 Base) MCG/ACT inhaler Inhale 2 puffs into the lungs every 6 (six) hours as needed for wheezing or shortness of breath. 1 Inhaler 6  . Cholecalciferol (VITAMIN D PO) Take 1 tablet by mouth daily.     . hydroxychloroquine (PLAQUENIL) 200 MG tablet Take by mouth.    . levothyroxine (SYNTHROID, LEVOTHROID) 112 MCG tablet Take 112 mcg by mouth daily.     Marland Kitchen omeprazole (PRILOSEC) 20 MG capsule Take 20 mg by mouth daily.  1  . ramipril (ALTACE) 10 MG capsule Take 10 mg by mouth daily.  3  . sertraline (ZOLOFT) 50 MG tablet Take 50 mg by mouth daily.    . sodium chloride HYPERTONIC 3 % nebulizer solution Take by nebulization 2 (two) times daily. 300 mL 11   No facility-administered medications prior to visit.     Review of Systems  Constitutional: Negative for chills, fever, malaise/fatigue and weight loss.  HENT: Negative for hearing loss, sore throat and tinnitus.   Eyes: Negative for blurred vision and double vision.  Respiratory: Positive for cough and shortness of breath. Negative for hemoptysis, sputum production, wheezing and stridor.   Cardiovascular: Negative for chest pain, palpitations, orthopnea, leg swelling and PND.  Gastrointestinal: Negative for abdominal pain, constipation, diarrhea, heartburn, nausea and vomiting.  Genitourinary: Negative for dysuria, hematuria and urgency.  Musculoskeletal: Negative for joint pain and myalgias.  Skin: Negative for itching and rash.  Neurological: Negative for dizziness, tingling, weakness and headaches.  Endo/Heme/Allergies: Negative for environmental allergies. Does not bruise/bleed easily.  Psychiatric/Behavioral: Negative for depression. The patient is not nervous/anxious and does not have insomnia.   All other systems reviewed and are negative.    Objective:  Physical Exam Vitals signs reviewed.  Constitutional:      General: She is not in acute distress.    Appearance: She is well-developed.  HENT:     Head: Normocephalic and atraumatic.  Eyes:     General: No scleral icterus.    Conjunctiva/sclera: Conjunctivae normal.     Pupils: Pupils are equal, round, and reactive to light.  Neck:     Musculoskeletal: Neck supple.      Vascular: No JVD.     Trachea: No tracheal deviation.  Cardiovascular:     Rate and Rhythm: Normal rate and regular rhythm.     Heart sounds: Normal heart sounds. No murmur.  Pulmonary:     Effort: Pulmonary effort is normal. No tachypnea, accessory muscle usage or respiratory distress.     Breath sounds: No stridor. No wheezing, rhonchi or rales.     Comments: Bilateral bronchial breath sounds Abdominal:     General: Bowel sounds are normal. There is no distension.     Palpations: Abdomen is soft.     Tenderness: There is no abdominal tenderness.  Musculoskeletal:  General: No tenderness.  Lymphadenopathy:     Cervical: No cervical adenopathy.  Skin:    General: Skin is warm and dry.     Capillary Refill: Capillary refill takes less than 2 seconds.     Findings: No rash.  Neurological:     Mental Status: She is alert and oriented to person, place, and time.  Psychiatric:        Behavior: Behavior normal.      Vitals:   01/28/19 1138  BP: 124/86  Pulse: 83  Temp: 97.6 F (36.4 C)  TempSrc: Temporal  SpO2: 95%  Weight: 165 lb 3.2 oz (74.9 kg)  Height: 5\' 3"  (1.6 m)   95% on RA BMI Readings from Last 3 Encounters:  01/28/19 29.26 kg/m  05/14/18 28.63 kg/m  12/18/17 29.19 kg/m   Wt Readings from Last 3 Encounters:  01/28/19 165 lb 3.2 oz (74.9 kg)  05/14/18 161 lb 9.6 oz (73.3 kg)  12/18/17 164 lb 12.8 oz (74.8 kg)     CBC No results found for: WBC, RBC, HGB, HCT, PLT, MCV, MCH, MCHC, RDW, LYMPHSABS, MONOABS, EOSABS, BASOSABS   Chest Imaging:  July 2019: High-resolution CT scan of the chest. Bilateral bronchiectasis. The patient's images have been independently reviewed by me.    Pulmonary Functions Testing Results: PFT Results Latest Ref Rng & Units 08/19/2017  FVC-Pre L 1.33  FVC-Predicted Pre % 48  FVC-Post L 1.45  FVC-Predicted Post % 52  Pre FEV1/FVC % % 72  Post FEV1/FCV % % 69  FEV1-Pre L 0.96  FEV1-Predicted Pre % 46  FEV1-Post  L 1.00  DLCO UNC% % 54  DLCO COR %Predicted % 100  TLC L 5.47  TLC % Predicted % 108  RV % Predicted % 177    FeNO: None   Pathology: None   Echocardiogram: None   Heart Catheterization:  07/10/2017 left heart catheterization:  The left ventricular systolic function is normal. EF 55-65% by visual estimate.  LV end diastolic pressure is moderately elevated.  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 otherwise essentially angiographically normal coronaries    Assessment & Plan:     ICD-10-CM   1. Bronchiectasis without complication ([place])  A999333   2. Chronic obstructive pulmonary disease, unspecified COPD type ([place])  J44.9 Ambulatory Referral for DME  3. Sjogren's syndrome without extraglandular involvement ([place])  M35.00   4. Dysphagia, unspecified type  R13.10     Discussion:  78 year old female with bronchiectasis history of Sjogren's syndrome.  Currently doing well on bronchodilators, Anoro Ellipta plus as needed albuterol.  Sjogren's syndrome currently maintained with Plaquenil.  Plan: Continue current bronchodilator regimen and Continue albuterol nebulize her Patient needs supplies for new nebulizer tubing and excess raise. Continue Anoro Ellipta. Continue hypertonic solution nebulized 2 times a day. Continue use of flutter valve regularly If patient develops worsening issues with airway clearance can consider vest therapy in the future.  Patient return to clinic in 1 year or as needed pending on symptoms.  Greater than 50% of this patient's 25-minute office was spent face-to-face discussing above recommendations and treatment plan as well as establish care with new pulmonary provider.    Current Outpatient Medications:  .  albuterol (PROVENTIL HFA;VENTOLIN HFA) 108 (90 Base) MCG/ACT inhaler, Inhale 2 puffs into the lungs every 6 (six) hours as needed for wheezing or shortness of breath., Disp: 1 Inhaler, Rfl: 6 .   Cholecalciferol (VITAMIN D PO), Take 1 tablet  by mouth daily., Disp: , Rfl:  .  hydroxychloroquine (PLAQUENIL) 200 MG tablet, Take by mouth., Disp: , Rfl:  .  levothyroxine (SYNTHROID, LEVOTHROID) 112 MCG tablet, Take 112 mcg by mouth daily. , Disp: , Rfl:  .  omeprazole (PRILOSEC) 20 MG capsule, Take 20 mg by mouth daily., Disp: , Rfl: 1 .  ramipril (ALTACE) 10 MG capsule, Take 10 mg by mouth daily., Disp: , Rfl: 3 .  sertraline (ZOLOFT) 50 MG tablet, Take 50 mg by mouth daily., Disp: , Rfl:  .  sodium chloride HYPERTONIC 3 % nebulizer solution, Take by nebulization 2 (two) times daily., Disp: 300 mL, Rfl: [place], DO [facility] 01/28/2019 11:49 AM

## 2019-02-04 DIAGNOSIS — L578 Other skin changes due to chronic exposure to nonionizing radiation: Secondary | ICD-10-CM | POA: Diagnosis not present

## 2019-04-15 DIAGNOSIS — L570 Actinic keratosis: Secondary | ICD-10-CM | POA: Diagnosis not present

## 2019-04-15 DIAGNOSIS — L905 Scar conditions and fibrosis of skin: Secondary | ICD-10-CM | POA: Diagnosis not present

## 2019-04-15 DIAGNOSIS — Z85828 Personal history of other malignant neoplasm of skin: Secondary | ICD-10-CM | POA: Diagnosis not present

## 2019-04-27 DIAGNOSIS — Z961 Presence of intraocular lens: Secondary | ICD-10-CM | POA: Diagnosis not present

## 2019-04-27 DIAGNOSIS — H524 Presbyopia: Secondary | ICD-10-CM | POA: Diagnosis not present

## 2019-05-06 DIAGNOSIS — I7300 Raynaud's syndrome without gangrene: Secondary | ICD-10-CM | POA: Diagnosis not present

## 2019-05-06 DIAGNOSIS — E663 Overweight: Secondary | ICD-10-CM | POA: Diagnosis not present

## 2019-05-06 DIAGNOSIS — R0609 Other forms of dyspnea: Secondary | ICD-10-CM | POA: Diagnosis not present

## 2019-05-06 DIAGNOSIS — M3500 Sicca syndrome, unspecified: Secondary | ICD-10-CM | POA: Diagnosis not present

## 2019-05-06 DIAGNOSIS — M150 Primary generalized (osteo)arthritis: Secondary | ICD-10-CM | POA: Diagnosis not present

## 2019-05-06 DIAGNOSIS — Z6829 Body mass index (BMI) 29.0-29.9, adult: Secondary | ICD-10-CM | POA: Diagnosis not present

## 2019-06-08 DIAGNOSIS — H35071 Retinal telangiectasis, right eye: Secondary | ICD-10-CM | POA: Diagnosis not present

## 2019-06-08 DIAGNOSIS — H43811 Vitreous degeneration, right eye: Secondary | ICD-10-CM | POA: Diagnosis not present

## 2019-06-08 DIAGNOSIS — H43812 Vitreous degeneration, left eye: Secondary | ICD-10-CM | POA: Diagnosis not present

## 2019-06-08 DIAGNOSIS — Z79899 Other long term (current) drug therapy: Secondary | ICD-10-CM | POA: Diagnosis not present

## 2019-06-08 DIAGNOSIS — H35072 Retinal telangiectasis, left eye: Secondary | ICD-10-CM | POA: Diagnosis not present

## 2019-06-24 DIAGNOSIS — E7849 Other hyperlipidemia: Secondary | ICD-10-CM | POA: Diagnosis not present

## 2019-06-24 DIAGNOSIS — M109 Gout, unspecified: Secondary | ICD-10-CM | POA: Diagnosis not present

## 2019-06-24 DIAGNOSIS — E038 Other specified hypothyroidism: Secondary | ICD-10-CM | POA: Diagnosis not present

## 2019-07-01 DIAGNOSIS — M3500 Sicca syndrome, unspecified: Secondary | ICD-10-CM | POA: Diagnosis not present

## 2019-07-01 DIAGNOSIS — R82998 Other abnormal findings in urine: Secondary | ICD-10-CM | POA: Diagnosis not present

## 2019-07-01 DIAGNOSIS — I7300 Raynaud's syndrome without gangrene: Secondary | ICD-10-CM | POA: Diagnosis not present

## 2019-07-01 DIAGNOSIS — R0609 Other forms of dyspnea: Secondary | ICD-10-CM | POA: Diagnosis not present

## 2019-07-01 DIAGNOSIS — E039 Hypothyroidism, unspecified: Secondary | ICD-10-CM | POA: Diagnosis not present

## 2019-07-01 DIAGNOSIS — I10 Essential (primary) hypertension: Secondary | ICD-10-CM | POA: Diagnosis not present

## 2019-07-01 DIAGNOSIS — J449 Chronic obstructive pulmonary disease, unspecified: Secondary | ICD-10-CM | POA: Diagnosis not present

## 2019-07-01 DIAGNOSIS — E663 Overweight: Secondary | ICD-10-CM | POA: Diagnosis not present

## 2019-07-01 DIAGNOSIS — Z0000 Encounter for general adult medical examination without abnormal findings: Secondary | ICD-10-CM | POA: Diagnosis not present

## 2019-07-01 DIAGNOSIS — E785 Hyperlipidemia, unspecified: Secondary | ICD-10-CM | POA: Diagnosis not present

## 2019-07-08 DIAGNOSIS — Z1212 Encounter for screening for malignant neoplasm of rectum: Secondary | ICD-10-CM | POA: Diagnosis not present

## 2019-07-15 DIAGNOSIS — L570 Actinic keratosis: Secondary | ICD-10-CM | POA: Diagnosis not present

## 2019-07-23 ENCOUNTER — Other Ambulatory Visit: Payer: Self-pay | Admitting: Pulmonary Disease

## 2019-11-04 DIAGNOSIS — Z6829 Body mass index (BMI) 29.0-29.9, adult: Secondary | ICD-10-CM | POA: Diagnosis not present

## 2019-11-04 DIAGNOSIS — R0609 Other forms of dyspnea: Secondary | ICD-10-CM | POA: Diagnosis not present

## 2019-11-04 DIAGNOSIS — E663 Overweight: Secondary | ICD-10-CM | POA: Diagnosis not present

## 2019-11-04 DIAGNOSIS — M3500 Sicca syndrome, unspecified: Secondary | ICD-10-CM | POA: Diagnosis not present

## 2019-11-04 DIAGNOSIS — M150 Primary generalized (osteo)arthritis: Secondary | ICD-10-CM | POA: Diagnosis not present

## 2019-11-04 DIAGNOSIS — I7300 Raynaud's syndrome without gangrene: Secondary | ICD-10-CM | POA: Diagnosis not present

## 2019-12-01 DIAGNOSIS — Z23 Encounter for immunization: Secondary | ICD-10-CM | POA: Diagnosis not present

## 2020-01-20 DIAGNOSIS — L905 Scar conditions and fibrosis of skin: Secondary | ICD-10-CM | POA: Diagnosis not present

## 2020-01-20 DIAGNOSIS — L570 Actinic keratosis: Secondary | ICD-10-CM | POA: Diagnosis not present

## 2020-01-20 DIAGNOSIS — Z85828 Personal history of other malignant neoplasm of skin: Secondary | ICD-10-CM | POA: Diagnosis not present

## 2020-01-25 ENCOUNTER — Other Ambulatory Visit: Payer: Self-pay | Admitting: Internal Medicine

## 2020-01-25 DIAGNOSIS — Z0000 Encounter for general adult medical examination without abnormal findings: Secondary | ICD-10-CM

## 2020-02-01 DIAGNOSIS — I700 Atherosclerosis of aorta: Secondary | ICD-10-CM | POA: Diagnosis not present

## 2020-02-01 DIAGNOSIS — J449 Chronic obstructive pulmonary disease, unspecified: Secondary | ICD-10-CM | POA: Diagnosis not present

## 2020-02-01 DIAGNOSIS — I10 Essential (primary) hypertension: Secondary | ICD-10-CM | POA: Diagnosis not present

## 2020-02-01 DIAGNOSIS — M3500 Sicca syndrome, unspecified: Secondary | ICD-10-CM | POA: Diagnosis not present

## 2020-02-01 DIAGNOSIS — E663 Overweight: Secondary | ICD-10-CM | POA: Diagnosis not present

## 2020-02-10 ENCOUNTER — Telehealth: Payer: Self-pay | Admitting: Pulmonary Disease

## 2020-02-10 MED ORDER — SODIUM CHLORIDE 3 % IN NEBU: INHALATION_SOLUTION | Freq: Two times a day (BID) | RESPIRATORY_TRACT | 12 refills | Status: DC

## 2020-02-10 NOTE — Telephone Encounter (Signed)
Called and spoke to pt. Pt is requesting refill for hypertonic saline solution. Rx sent to preferred pharmacy. Pt verbalized understanding and denied any further questions or concerns at this time.

## 2020-02-29 DIAGNOSIS — L905 Scar conditions and fibrosis of skin: Secondary | ICD-10-CM | POA: Diagnosis not present

## 2020-02-29 DIAGNOSIS — L819 Disorder of pigmentation, unspecified: Secondary | ICD-10-CM | POA: Diagnosis not present

## 2020-02-29 DIAGNOSIS — Z85828 Personal history of other malignant neoplasm of skin: Secondary | ICD-10-CM | POA: Diagnosis not present

## 2020-02-29 DIAGNOSIS — L570 Actinic keratosis: Secondary | ICD-10-CM | POA: Diagnosis not present

## 2020-03-07 ENCOUNTER — Other Ambulatory Visit: Payer: Self-pay

## 2020-03-07 ENCOUNTER — Encounter (INDEPENDENT_AMBULATORY_CARE_PROVIDER_SITE_OTHER): Payer: Self-pay | Admitting: Ophthalmology

## 2020-03-07 ENCOUNTER — Ambulatory Visit (INDEPENDENT_AMBULATORY_CARE_PROVIDER_SITE_OTHER): Payer: PPO | Admitting: Ophthalmology

## 2020-03-07 DIAGNOSIS — Z79899 Other long term (current) drug therapy: Secondary | ICD-10-CM | POA: Diagnosis not present

## 2020-03-07 DIAGNOSIS — H43813 Vitreous degeneration, bilateral: Secondary | ICD-10-CM

## 2020-03-07 HISTORY — DX: Other long term (current) drug therapy: Z79.899

## 2020-03-07 NOTE — Assessment & Plan Note (Signed)

## 2020-03-07 NOTE — Patient Instructions (Signed)
Patient instructed to contact the office promptly new onset visual acuity declines or distortions

## 2020-03-07 NOTE — Assessment & Plan Note (Signed)
Patient continues on Plaquenil 200 mg, 1 tablet twice daily, for "arthritis", under the direction of Dr. Amil Amen

## 2020-03-07 NOTE — Progress Notes (Signed)
03/07/2020     CHIEF COMPLAINT Patient presents for Retina Follow Up (9 MO FU OU///Pt reports blurry vision OD, some burning and FB sensation OD, no pain or pressure. )   HISTORY OF PRESENT ILLNESS: Aimee Brown is a 79 y.o. female who presents to the clinic today for:   HPI    Retina Follow Up    Patient presents with  Other.  In both eyes.  This started 9.  Duration of months. Additional comments: 9 MO FU OU   Pt reports blurry vision OD, some burning and FB sensation OD, no pain or pressure.        Last edited by Nichola Sizer D on 03/07/2020  1:00 PM. (History)      Referring physician: Arman Filter, [place],  [place] [id number]  HISTORICAL INFORMATION:   Selected notes from the [facility]: No current outpatient medications on file. (Ophthalmic Drugs)   No current facility-administered medications for this visit. (Ophthalmic Drugs)   Current Outpatient Medications (Other)  Medication Sig  . albuterol (PROVENTIL HFA;VENTOLIN HFA) 108 (90 Base) MCG/ACT inhaler Inhale 2 puffs into the lungs every 6 (six) hours as needed for wheezing or shortness of breath.  . Cholecalciferol (VITAMIN D PO) Take 1 tablet by mouth daily.  . hydroxychloroquine (PLAQUENIL) 200 MG tablet Take by mouth.  . levothyroxine (SYNTHROID, LEVOTHROID) 112 MCG tablet Take 112 mcg by mouth daily.   Marland Kitchen omeprazole (PRILOSEC) 20 MG capsule Take 20 mg by mouth daily.  . ramipril (ALTACE) 10 MG capsule Take 10 mg by mouth daily.  . sertraline (ZOLOFT) 50 MG tablet Take 50 mg by mouth daily.  . sodium chloride HYPERTONIC 3 % nebulizer solution Take by nebulization in the morning and at bedtime. J47.9   No current facility-administered medications for this visit. (Other)      REVIEW OF SYSTEMS:    ALLERGIES No Known Allergies  PAST MEDICAL HISTORY Past Medical History:  Diagnosis Date  . Depression   . Hypercholesteremia    . Hypertension   . Long-term use of high-risk medication 03/07/2020   Past Surgical History:  Procedure Laterality Date  . ABDOMINAL HYSTERECTOMY    . CERVICAL LAMINECTOMY    . CESAREAN SECTION    . KIDNEY STONE SURGERY    . LEFT HEART CATH AND CORONARY ANGIOGRAPHY N/A 07/10/2017   Procedure: LEFT HEART CATH AND CORONARY ANGIOGRAPHY;  Surgeon: Leonie Man, MD;  Location: [facility];  Service: Cardiovascular;  Laterality: N/A;  . SALPINGOOPHORECTOMY    . TONSILLECTOMY      FAMILY HISTORY Family History  Problem Relation Age of Onset  . Breast cancer Paternal Aunt   . Breast cancer Maternal Grandmother   . Heart disease Mother   . Heart disease Father   . Scleroderma Brother     SOCIAL HISTORY Social History   Tobacco Use  . Smoking status: Never Smoker  . Smokeless tobacco: Never Used  Substance Use Topics  . Alcohol use: No  . Drug use: Never         OPHTHALMIC EXAM: Base Eye Exam    Visual Acuity (ETDRS)      Right Left   Dist cc 20/40 20/25 -1   Dist ph cc NI        Tonometry (Tonopen, 1:05 PM)      Right Left   Pressure 18 20  Pupils      Pupils Dark Light Shape React APD   Right PERRL 3 2 Round Brisk None   Left PERRL 3 2 Round Brisk None       Visual Fields (Counting fingers)      Left Right    Full        Extraocular Movement      Right Left    Full Full       Neuro/Psych    Oriented x3: Yes        Slit Lamp and Fundus Exam    External Exam      Right Left   External Normal Normal       Slit Lamp Exam      Right Left   Lids/Lashes Normal Normal   Conjunctiva/Sclera White and quiet White and quiet   Cornea Clear Clear   Anterior Chamber Deep and quiet Deep and quiet   Iris Round and reactive Round and reactive   Lens Centered posterior chamber intraocular lens    Anterior Vitreous Normal Normal       Fundus Exam      Right Left   Posterior Vitreous Posterior vitreous detachment Posterior vitreous  detachment   Disc Normal White fibrous nonproliferative tissue over nerve head   C/D Ratio 0.05 0.0   Macula Normal Normal   Vessels Normal Normal   Periphery Normal Normal          IMAGING AND PROCEDURES  Imaging and Procedures for 03/07/20  OCT, Retina - OU - Both Eyes       Right Eye Quality was good. Scan locations included subfoveal. Central Foveal Thickness: 311. Progression has been stable. Findings include normal foveal contour.   Left Eye Quality was good. Scan locations included subfoveal. Central Foveal Thickness: 309. Progression has been stable. Findings include normal foveal contour.   Notes Incidental posterior vitreous detachment OU, no active disease                ASSESSMENT/PLAN:  Long-term use of high-risk medication Patient continues on Plaquenil 200 mg, 1 tablet twice daily, for "arthritis", under the direction of Dr. Amil Amen  Posterior vitreous detachment of both eyes   The nature of posterior vitreous detachment was discussed with the patient as well as its physiology, its age prevalence, and its possible implication regarding retinal breaks and detachment.  An informational brochure was given to the patient.  All the patient's questions were answered.  The patient was asked to return if new or different flashes or floaters develops.   Patient was instructed to contact office immediately if any changes were noticed. I explained to the patient that vitreous inside the eye is similar to jello inside a bowl. As the jello melts it can start to pull away from the bowl, similarly the vitreous throughout our lives can begin to pull away from the retina. That process is called a posterior vitreous detachment. In some cases, the vitreous can tug hard enough on the retina to form a retinal tear. I discussed with the patient the signs and symptoms of a retinal detachment.  Do not rub the eye.      ICD-10-CM   1. Long-term use of high-risk medication  Z79.899  OCT, Retina - OU - Both Eyes  2. Posterior vitreous detachment of both eyes  H43.813 OCT, Retina - OU - Both Eyes    1.  No signs of ocular, retinal toxicity from long-term Plaquenil use.  2.  Patient  instructed to confirm with Dr. Amil Amen that she is on the proper dosage for lean body mass  3.  We will continue to monitor on a long-term basis follow-up in 9 months 4.  Suspicion for macular telangiectasis in the past, no confirmatory evidence on last FFA performed June 2020 nor current OCT  Ophthalmic Meds Ordered this visit:  No orders of the defined types were placed in this encounter.      Return in about 9 months (around 12/06/2020) for DILATE OU, COLOR FP, OCT.  There are no Patient Instructions on file for this visit.   Explained the diagnoses, plan, and follow up with the patient and they expressed understanding.  Patient expressed understanding of the importance of proper follow up care.   Clent Demark Xachary Hambly M.D. Diseases & Surgery of the Retina and Vitreous Retina & Diabetic [place] 03/07/20     Abbreviations: M myopia (nearsighted); A astigmatism; H hyperopia (farsighted); P presbyopia; Mrx spectacle prescription;  CTL contact lenses; OD right eye; OS left eye; OU both eyes  XT exotropia; ET esotropia; PEK punctate epithelial keratitis; PEE punctate epithelial erosions; DES dry eye syndrome; MGD meibomian gland dysfunction; ATs artificial tears; PFAT's preservative free artificial tears; [place] nuclear sclerotic cataract; PSC posterior subcapsular cataract; ERM epi-retinal membrane; PVD posterior vitreous detachment; RD retinal detachment; DM diabetes mellitus; DR diabetic retinopathy; NPDR non-proliferative diabetic retinopathy; PDR proliferative diabetic retinopathy; CSME clinically significant macular edema; DME diabetic macular edema; dbh dot blot hemorrhages; CWS cotton wool spot; POAG primary open angle glaucoma; C/D cup-to-disc ratio; HVF humphrey visual field; GVF  goldmann visual field; OCT optical coherence tomography; IOP intraocular pressure; BRVO Branch retinal vein occlusion; CRVO central retinal vein occlusion; CRAO central retinal artery occlusion; BRAO branch retinal artery occlusion; RT retinal tear; SB scleral buckle; PPV pars plana vitrectomy; VH Vitreous hemorrhage; PRP panretinal laser photocoagulation; IVK intravitreal kenalog; VMT vitreomacular traction; MH Macular hole;  NVD neovascularization of the disc; NVE neovascularization elsewhere; AREDS age related eye disease study; ARMD age related macular degeneration; POAG primary open angle glaucoma; EBMD epithelial/anterior basement membrane dystrophy; ACIOL anterior chamber intraocular lens; IOL intraocular lens; PCIOL posterior chamber intraocular lens; Phaco/IOL phacoemulsification with intraocular lens placement; [place] photorefractive keratectomy; LASIK laser assisted in situ keratomileusis; HTN hypertension; DM diabetes mellitus; COPD chronic obstructive pulmonary disease

## 2020-03-08 ENCOUNTER — Ambulatory Visit
Admission: RE | Admit: 2020-03-08 | Discharge: 2020-03-08 | Disposition: A | Discharge status: Home / Self Care | Payer: PPO | Source: Ambulatory Visit | Attending: Internal Medicine | Admitting: Internal Medicine

## 2020-03-08 DIAGNOSIS — Z1231 Encounter for screening mammogram for malignant neoplasm of breast: Secondary | ICD-10-CM | POA: Diagnosis not present

## 2020-03-08 DIAGNOSIS — Z0000 Encounter for general adult medical examination without abnormal findings: Secondary | ICD-10-CM

## 2020-03-14 ENCOUNTER — Encounter: Payer: Self-pay | Admitting: Pulmonary Disease

## 2020-03-14 ENCOUNTER — Other Ambulatory Visit: Payer: Self-pay | Admitting: Internal Medicine

## 2020-03-14 ENCOUNTER — Ambulatory Visit: Payer: PPO | Admitting: Pulmonary Disease

## 2020-03-14 ENCOUNTER — Other Ambulatory Visit: Payer: Self-pay

## 2020-03-14 VITALS — BP 130/70 | HR 85 | Ht 63.0 in | Wt 172.0 lb

## 2020-03-14 DIAGNOSIS — Z79899 Other long term (current) drug therapy: Secondary | ICD-10-CM | POA: Diagnosis not present

## 2020-03-14 DIAGNOSIS — J479 Bronchiectasis, uncomplicated: Secondary | ICD-10-CM | POA: Diagnosis not present

## 2020-03-14 DIAGNOSIS — J449 Chronic obstructive pulmonary disease, unspecified: Secondary | ICD-10-CM | POA: Diagnosis not present

## 2020-03-14 DIAGNOSIS — M3500 Sicca syndrome, unspecified: Secondary | ICD-10-CM | POA: Diagnosis not present

## 2020-03-14 DIAGNOSIS — R928 Other abnormal and inconclusive findings on diagnostic imaging of breast: Secondary | ICD-10-CM

## 2020-03-14 MED ORDER — ANORO ELLIPTA 62.5-25 MCG/INH IN AEPB: 1.0000 | INHALATION_SPRAY | Freq: Every day | RESPIRATORY_TRACT | 0 refills | Status: DC

## 2020-03-14 MED ORDER — AZITHROMYCIN 250 MG PO TABS: 250.0000 mg | ORAL_TABLET | ORAL | 0 refills | Status: AC

## 2020-03-14 NOTE — Progress Notes (Signed)
Synopsis: Referred in November 2020 former patient Dr. Lake Bells, bronchiectasis.  PCP: Arman Filter, FNP  Subjective:   PATIENT ID: Aimee Brown GENDER: female DOB: October 25, 1940, MRN: [id number]  Chief Complaint  Patient presents with  . Follow-up    Pt states she has been doing okay since last visit. States she still becomes SOB with exertion and has an occ cough with white phlegm.    This is a 79 year old female past medical history of hyperlipidemia, hypertension former patient Dr. Lake Bells followed for bronchiectasis and COPD.  Pulmonary function test June 2019 revealed severe airflow obstruction, FEV1 1.0 L, 48% predicted, no significant bronchodilator response, RV/TLC ratio 163%, DLCO 54%.  Patient is a lifelong non-smoker.  She does have secondhand smoke exposure from a previous job.  Patient has been doing well from a respiratory standpoint. She does still get dyspneic on exertion going from the grocery store to the car.  She will have to find sometimes a place to sit and rest.  She has been using her Anoro Ellipta regularly.  She uses her nebulizer regularly.  She does need new tubing and accessories for her nebulizer machine.  She has a crack in the cup and it leaks currently.  Patient does not have any significant sputum production, fevers chills night sweats weight loss.  She uses her flutter valve to help clear her airway.  She feels that her postural drainage as well as a flutter valve is working well for her at this time.  OV 03/14/2020: Patient here today for follow-up regarding bronchiectasis, COPD.  She has severe airflow obstruction on prior pulmonary function tests.  She does have dyspnea on exertion currently managed with LABA/LAMA.  She uses her albuterol as needed.  She does state that she is having increased dyspnea on exertion as well as nocturnal coughing.  She does have sputum production.  She does seem to have progression of symptoms as the last time we have met.  We  discussed this today in detail denies hemoptysis.   Past Medical History:  Diagnosis Date  . Depression   . Hypercholesteremia   . Hypertension   . Long-term use of high-risk medication 03/07/2020     Family History  Problem Relation Age of Onset  . Breast cancer Paternal Aunt   . Breast cancer Maternal Grandmother   . Heart disease Mother   . Heart disease Father   . Scleroderma Brother      Past Surgical History:  Procedure Laterality Date  . ABDOMINAL HYSTERECTOMY    . CERVICAL LAMINECTOMY    . CESAREAN SECTION    . KIDNEY STONE SURGERY    . LEFT HEART CATH AND CORONARY ANGIOGRAPHY N/A 07/10/2017   Procedure: LEFT HEART CATH AND CORONARY ANGIOGRAPHY;  Surgeon: Leonie Man, MD;  Location: [facility];  Service: Cardiovascular;  Laterality: N/A;  . SALPINGOOPHORECTOMY    . TONSILLECTOMY      Social History   Socioeconomic History  . Marital status: Married    Spouse name: Not on file  . Number of children: Not on file  . Years of education: Not on file  . Highest education level: Not on file  Occupational History  . Not on file  Tobacco Use  . Smoking status: Never Smoker  . Smokeless tobacco: Never Used  Substance and Sexual Activity  . Alcohol use: No  . Drug use: Never  . Sexual activity: Not on file  Other Topics Concern  . Not on  file  Social History Narrative  . Not on file   Social Determinants of Health   Financial Resource Strain: Not on file  Food Insecurity: Not on file  Transportation Needs: Not on file  Physical Activity: Not on file  Stress: Not on file  Social Connections: Not on file  Intimate Partner Violence: Not on file     No Known Allergies   Outpatient Medications Prior to Visit  Medication Sig Dispense Refill  . albuterol (PROVENTIL HFA;VENTOLIN HFA) 108 (90 Base) MCG/ACT inhaler Inhale 2 puffs into the lungs every 6 (six) hours as needed for wheezing or shortness of breath. 1 Inhaler 6  . ANORO ELLIPTA 62.5-25  MCG/INH AEPB Inhale 1 puff into the lungs daily.    . Cholecalciferol (VITAMIN D PO) Take 1 tablet by mouth daily.    . hydroxychloroquine (PLAQUENIL) 200 MG tablet Take by mouth.    . levothyroxine (SYNTHROID, LEVOTHROID) 112 MCG tablet Take 112 mcg by mouth daily.     Marland Kitchen omeprazole (PRILOSEC) 20 MG capsule Take 20 mg by mouth daily.  1  . ramipril (ALTACE) 10 MG capsule Take 10 mg by mouth daily.  3  . sertraline (ZOLOFT) 50 MG tablet Take 50 mg by mouth daily.    . sodium chloride HYPERTONIC 3 % nebulizer solution Take by nebulization in the morning and at bedtime. J47.9 750 mL 12  . TOLAK 4 % CREA SMARTSIG:1 Sparingly Topical Daily     No facility-administered medications prior to visit.    Review of Systems  Constitutional: Negative for chills, fever, malaise/fatigue and weight loss.  HENT: Negative for hearing loss, sore throat and tinnitus.   Eyes: Negative for blurred vision and double vision.  Respiratory: Positive for cough, sputum production and shortness of breath. Negative for hemoptysis, wheezing and stridor.   Cardiovascular: Negative for chest pain, palpitations, orthopnea, leg swelling and PND.  Gastrointestinal: Negative for abdominal pain, constipation, diarrhea, heartburn, nausea and vomiting.  Genitourinary: Negative for dysuria, hematuria and urgency.  Musculoskeletal: Negative for joint pain and myalgias.  Skin: Negative for itching and rash.  Neurological: Negative for dizziness, tingling, weakness and headaches.  Endo/Heme/Allergies: Negative for environmental allergies. Does not bruise/bleed easily.  Psychiatric/Behavioral: Negative for depression. The patient is not nervous/anxious and does not have insomnia.   All other systems reviewed and are negative.    Objective:  Physical Exam Vitals reviewed.  Constitutional:      General: She is not in acute distress.    Appearance: She is well-developed and well-nourished.  HENT:     Head: Normocephalic and  atraumatic.     Mouth/Throat:     Mouth: Oropharynx is clear and moist.  Eyes:     General: No scleral icterus.    Conjunctiva/sclera: Conjunctivae normal.     Pupils: Pupils are equal, round, and reactive to light.  Neck:     Vascular: No JVD.     Trachea: No tracheal deviation.  Cardiovascular:     Rate and Rhythm: Normal rate and regular rhythm.     Pulses: Intact distal pulses.     Heart sounds: Normal heart sounds. No murmur heard.   Pulmonary:     Effort: Pulmonary effort is normal. No tachypnea, accessory muscle usage or respiratory distress.     Breath sounds: Normal breath sounds. No stridor. No rhonchi or rales.  Abdominal:     General: Bowel sounds are normal. There is no distension.     Palpations: Abdomen is soft.  Tenderness: There is no abdominal tenderness.  Musculoskeletal:        General: No tenderness or edema.     Cervical back: Neck supple.  Lymphadenopathy:     Cervical: No cervical adenopathy.  Skin:    General: Skin is warm and dry.     Capillary Refill: Capillary refill takes less than 2 seconds.     Findings: No rash.  Neurological:     Mental Status: She is alert and oriented to person, place, and time.  Psychiatric:        Mood and Affect: Mood and affect normal.        Behavior: Behavior normal.      Vitals:   03/14/20 1203  BP: 130/70  Pulse: 85  SpO2: 97%  Weight: 172 lb (78 kg)  Height: _0  (1.6 m)   97% on RA BMI Readings from Last 3 Encounters:  03/14/20 30.47 kg/m  01/28/19 29.26 kg/m  05/14/18 28.63 kg/m   Wt Readings from Last 3 Encounters:  03/14/20 172 lb (78 kg)  01/28/19 165 lb 3.2 oz (74.9 kg)  05/14/18 161 lb 9.6 oz (73.3 kg)     CBC No results found for: WBC, RBC, HGB, HCT, PLT, MCV, MCH, MCHC, RDW, LYMPHSABS, MONOABS, EOSABS, BASOSABS   Chest Imaging:  July 2019: High-resolution CT scan of the chest. Bilateral bronchiectasis. The patient's images have been independently reviewed by me.     Pulmonary Functions Testing Results: PFT Results Latest Ref Rng & Units 08/19/2017  FVC-Pre L 1.33  FVC-Predicted Pre % 48  FVC-Post L 1.45  FVC-Predicted Post % 52  Pre FEV1/FVC % % 72  Post FEV1/FCV % % 69  FEV1-Pre L 0.96  FEV1-Predicted Pre % 46  FEV1-Post L 1.00  DLCO uncorrected ml/min/mmHg 13.16  DLCO UNC% % 54  DLVA Predicted % 100  TLC L 5.47  TLC % Predicted % 108  RV % Predicted % 177    FeNO: None   Pathology: None   Echocardiogram: None   Heart Catheterization:  07/10/2017 left heart catheterization:  The left ventricular systolic function is normal. EF 55-65% by visual estimate.  LV end diastolic pressure is moderately elevated.  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 otherwise essentially angiographically normal coronaries    Assessment & Plan:     ICD-10-CM   1. Bronchiectasis without complication ([place])  N56.2 AFB Culture & Smear    Fungus Culture & Smear    Respiratory or Resp and Sputum Culture  2. Encounter for medication management  Z79.899 EKG 12-Lead  3. 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 Chronic obstructive pulmonary disease, unspecified COPD type ([place])  J44.9     Discussion:  This is a 79 year old female with history of bronchiectasis and Sjogren's syndrome currently on Plaquenil, maintain with bronchodilators, Anoro Ellipta plus as needed albuterol.  She does seemingly have increased symptoms with shortness of breath dyspnea on exertion coughing spells and sputum production.  Plan: Sputum cultures today for AFB fungus and bacteria. We will start azithromycin Monday Wednesday Friday Discussed the risk benefits and alternatives of starting azithromycin to include long-term chances of sensorineural hearing loss and potential changes in QTC. Prior to start we will check ECG today in the office to ensure QTC stability. Continue flutter valve. Can consider vest  therapy in the future if needed.  Patient to return to see me in approximately 3 months or APP with  a repeat ECG to document QTC stability.    Current Outpatient Medications:  .  albuterol (PROVENTIL HFA;VENTOLIN HFA) 108 (90 Base) MCG/ACT inhaler, Inhale 2 puffs into the lungs every 6 (six) hours as needed for wheezing or shortness of breath., Disp: 1 Inhaler, Rfl: 6 .  ANORO ELLIPTA 62.5-25 MCG/INH AEPB, Inhale 1 puff into the lungs daily., Disp: , Rfl:  .  Cholecalciferol (VITAMIN D PO), Take 1 tablet by mouth daily., Disp: , Rfl:  .  hydroxychloroquine (PLAQUENIL) 200 MG tablet, Take by mouth., Disp: , Rfl:  .  levothyroxine (SYNTHROID, LEVOTHROID) 112 MCG tablet, Take 112 mcg by mouth daily. , Disp: , Rfl:  .  omeprazole (PRILOSEC) 20 MG capsule, Take 20 mg by mouth daily., Disp: , Rfl: 1 .  ramipril (ALTACE) 10 MG capsule, Take 10 mg by mouth daily., Disp: , Rfl: 3 .  sertraline (ZOLOFT) 50 MG tablet, Take 50 mg by mouth daily., Disp: , Rfl:  .  sodium chloride HYPERTONIC 3 % nebulizer solution, Take by nebulization in the morning and at bedtime. J47.9, Disp: 750 mL, Rfl: 12 .  TOLAK 4 % CREA, SMARTSIG:1 Sparingly Topical Daily, Disp: , Rfl:   I spent 42 minutes dedicated to the care of this patient on the date of this encounter to include pre-visit review of records, face-to-face time with the patient discussing conditions above, post visit ordering of testing, clinical documentation with the electronic health record, making appropriate referrals as documented, and communicating necessary findings to members of the patients care team.    Garner Nash, DO [facility] 03/14/2020 12:07 PM

## 2020-03-14 NOTE — Patient Instructions (Signed)
Thank you for visiting Dr. Tonia Brooms at [facility]. Today we recommend the following:  Meds ordered this encounter  Medications  . azithromycin (ZITHROMAX) 250 MG tablet    Sig: Take 1 tablet (250 mg total) by mouth every Monday, Wednesday, and Friday.    Dispense:  36 tablet    Refill:  0   ECG today to check Qtc  Return in about 3 months (around 06/12/2020) for with APP or Dr. Tonia Brooms. for symptom follow up and repeat ECG.     Please do your part to reduce the spread of COVID-19.

## 2020-03-15 ENCOUNTER — Telehealth: Payer: Self-pay | Admitting: Pulmonary Disease

## 2020-03-15 NOTE — Telephone Encounter (Signed)
Called and spoke with pt and she is aware of BI recs.  She will call back if anything further is needed.

## 2020-03-15 NOTE — Telephone Encounter (Signed)
BI--  Pt stated that the pharmacy is on backorder for the hypertonic solution for the nebulizer.  Pt is wanting to know if she needs to use something else.  Please advise. Thanks

## 2020-03-15 NOTE — Telephone Encounter (Signed)
She can use just regular saline to nebulize to help break up secretions if needed  Aimee Igo, DO [facility] 03/15/2020 3:40 PM

## 2020-03-16 ENCOUNTER — Other Ambulatory Visit: Payer: PPO

## 2020-03-16 DIAGNOSIS — J479 Bronchiectasis, uncomplicated: Secondary | ICD-10-CM | POA: Diagnosis not present

## 2020-03-16 DIAGNOSIS — R591 Generalized enlarged lymph nodes: Secondary | ICD-10-CM | POA: Diagnosis not present

## 2020-03-16 DIAGNOSIS — M3500 Sicca syndrome, unspecified: Secondary | ICD-10-CM | POA: Diagnosis not present

## 2020-03-17 ENCOUNTER — Other Ambulatory Visit: Payer: Self-pay

## 2020-03-17 ENCOUNTER — Ambulatory Visit
Admission: RE | Admit: 2020-03-17 | Discharge: 2020-03-17 | Disposition: A | Discharge status: Home / Self Care | Payer: PPO | Source: Ambulatory Visit | Attending: Internal Medicine | Admitting: Internal Medicine

## 2020-03-17 ENCOUNTER — Ambulatory Visit: Payer: PPO

## 2020-03-17 DIAGNOSIS — R928 Other abnormal and inconclusive findings on diagnostic imaging of breast: Secondary | ICD-10-CM | POA: Diagnosis not present

## 2020-03-17 DIAGNOSIS — R922 Inconclusive mammogram: Secondary | ICD-10-CM | POA: Diagnosis not present

## 2020-03-20 LAB — RESPIRATORY CULTURE OR RESPIRATORY AND SPUTUM CULTURE: RESULT:: NORMAL

## 2020-03-21 LAB — FUNGUS CULTURE W SMEAR: MICRO NUMBER:: 11366337

## 2020-03-21 LAB — RESPIRATORY CULTURE OR RESPIRATORY AND SPUTUM CULTURE

## 2020-03-24 ENCOUNTER — Other Ambulatory Visit: Payer: Self-pay

## 2020-03-24 MED ORDER — ALBUTEROL SULFATE (2.5 MG/3ML) 0.083% IN NEBU: 2.5000 mg | INHALATION_SOLUTION | Freq: Four times a day (QID) | RESPIRATORY_TRACT | 12 refills | Status: DC | PRN

## 2020-03-28 ENCOUNTER — Telehealth: Payer: Self-pay | Admitting: Pulmonary Disease

## 2020-03-28 NOTE — Telephone Encounter (Signed)
Spoke with pt. She states that she can no longer get the Hypertonic solution and she tried the plain saline solution in the neb but it was too salty. Albuterol was called in but pt states that [facility] in [place] told her that they have ordered it but it hasnt come in yet and also that her insurance will not cover it. I tried to call the pharmacy to ask about this but was put on a long hold. I told pt we would try to call them again tomorrow to ask about the albuterol rx, Pt verbalized understanding.

## 2020-03-29 NOTE — Telephone Encounter (Signed)
Called Pharmacy and was advised the albuterol neb 0.083% needs a PA. Called [facility] at [phone] and spoke to the pharmacist and initiated the PA. Case #: [id number]. Will need to call back in a few days to check the status.

## 2020-04-01 NOTE — Telephone Encounter (Signed)
Called [facility] and was advised part D denied the medication but could be approved under part B. Called pharmacy and spoke to pharmacist and was advised the medication was still ran under part D but is now only $4.   Called and left message for pt that her albuterol is now only $4. Will try pt again before closing message.

## 2020-04-05 NOTE — Telephone Encounter (Addendum)
Called and left detailed message of pts medication cost and to call back if she has any questions or concerns.

## 2020-04-18 LAB — FUNGUS CULTURE W SMEAR: SPECIMEN QUALITY:: ADEQUATE

## 2020-04-18 LAB — RESPIRATORY CULTURE OR RESPIRATORY AND SPUTUM CULTURE
MICRO NUMBER:: 11366338
SPECIMEN QUALITY:: ADEQUATE

## 2020-04-25 ENCOUNTER — Telehealth: Payer: Self-pay | Admitting: Pulmonary Disease

## 2020-04-25 NOTE — Telephone Encounter (Signed)
I called and spoke with pt and she is aware per SG to start on probiotic.  She will do this and is aware that if the diarrhea continues she will need to call for an OV.    BI the pt has stopped the azithromycin for now and started on probiotic.  Please advise on any other abx. Treatment that you may want to try.  thanks

## 2020-04-25 NOTE — Telephone Encounter (Signed)
She needs to stop taking the medication. She should have been started on Probiotic with th antibiotic. If she was not, please have her start Probiotic. Culturelle or Align. If she continues to have diarrhea she needs to call the office to be seen. I have copied Dr. Valeta Harms on the message so he can determine other options for maintenance antibiotic thrapy

## 2020-04-25 NOTE — Telephone Encounter (Signed)
Dr. Valeta Harms patient last seen 03/14/2020 for Bronchiectasis and COPD  Called and spoke to patient.  Patient stated that she started maintenance azithromycin every M,W,F on 03/14/2020. Two weeks ago she developed persistent diarrhea. She did not take today's dose.  Denied f/c/s or additional symptoms     Judson Roch, please advise as Dr. Valeta Harms is unavailable. Thanks

## 2020-04-27 NOTE — Telephone Encounter (Signed)
I have called and LM on VM to make her aware to stop the azirthromycin as told by SG and start on the probiotic.  BI stated that there is not another option for abx for her to take.  Advised her if she had any further questions to give [place] a call.

## 2020-04-27 NOTE — Telephone Encounter (Signed)
No there are no other options. Yes it is an antibiotic but it is not being used for that purpose in patients with bronchiectasis.   BLI

## 2020-04-29 LAB — AFB CULTURE WITH SMEAR (NOT AT ARMC)
Acid Fast Culture: NEGATIVE
Acid Fast Smear: NEGATIVE

## 2020-05-02 ENCOUNTER — Telehealth: Payer: Self-pay | Admitting: Pulmonary Disease

## 2020-05-02 DIAGNOSIS — M3501 Sicca syndrome with keratoconjunctivitis: Secondary | ICD-10-CM | POA: Diagnosis not present

## 2020-05-02 DIAGNOSIS — H524 Presbyopia: Secondary | ICD-10-CM | POA: Diagnosis not present

## 2020-05-02 DIAGNOSIS — Z961 Presence of intraocular lens: Secondary | ICD-10-CM | POA: Diagnosis not present

## 2020-05-02 NOTE — Telephone Encounter (Signed)
Called and spoke with pt who wanted to know if she should go back taking the azithromycin. Looked back at previous message where SG said for pt to stop taking azithromycin and start taking probiotic and stated that info to her. Pt wanted to know if she should take just the probiotic or if she should go back taking the azithromycin as well as a probiotic taking them together or if she should take them at two separate times.  I reread the info to pt from [place] where she said to stop taking the azithromycin and start taking a probiotic and pt then became very confused.  Dr. Valeta Harms, please advise if you want pt to restart taking the azithromycin or if she should continue to not take it and just take a probiotic.    Pt stated that she has not had bad diarrhea like she had while she was on the azithromycin as she said that she did not know that she should be taking a probiotic while on this.

## 2020-05-02 NOTE — Telephone Encounter (Signed)
Question: Is she taking the azithromycin now?  If not? Then yes she can restart  If she has diarrhea again then she should stop.   [facility] 05/02/2020 5:30 PM

## 2020-05-03 NOTE — Telephone Encounter (Signed)
Called and spoke with pt and she stated that she is not currently taking the azithromycin.  She is aware to start back on this and keep taking the probiotic.  Pt is aware to stop if the diarrhea starts back.  She will let us know if she has any issues.

## 2020-05-03 NOTE — Telephone Encounter (Signed)
Patient is returning phone call. Patient phone number is [phone].

## 2020-05-03 NOTE — Telephone Encounter (Signed)
Attempted to call pt but line went straight to VM. Left message for her to return call. 

## 2020-05-11 DIAGNOSIS — E663 Overweight: Secondary | ICD-10-CM | POA: Diagnosis not present

## 2020-05-11 DIAGNOSIS — M150 Primary generalized (osteo)arthritis: Secondary | ICD-10-CM | POA: Diagnosis not present

## 2020-05-11 DIAGNOSIS — M3500 Sicca syndrome, unspecified: Secondary | ICD-10-CM | POA: Diagnosis not present

## 2020-05-11 DIAGNOSIS — Z6829 Body mass index (BMI) 29.0-29.9, adult: Secondary | ICD-10-CM | POA: Diagnosis not present

## 2020-05-11 DIAGNOSIS — R0609 Other forms of dyspnea: Secondary | ICD-10-CM | POA: Diagnosis not present

## 2020-05-11 DIAGNOSIS — I7300 Raynaud's syndrome without gangrene: Secondary | ICD-10-CM | POA: Diagnosis not present

## 2020-06-20 ENCOUNTER — Ambulatory Visit: Payer: PPO | Admitting: Pulmonary Disease

## 2020-06-20 ENCOUNTER — Encounter: Payer: Self-pay | Admitting: Pulmonary Disease

## 2020-06-20 VITALS — BP 126/80 | HR 78 | Temp 97.4°F | Ht 63.0 in | Wt 170.0 lb

## 2020-06-20 DIAGNOSIS — R0600 Dyspnea, unspecified: Secondary | ICD-10-CM | POA: Diagnosis not present

## 2020-06-20 DIAGNOSIS — J449 Chronic obstructive pulmonary disease, unspecified: Secondary | ICD-10-CM | POA: Diagnosis not present

## 2020-06-20 DIAGNOSIS — L819 Disorder of pigmentation, unspecified: Secondary | ICD-10-CM | POA: Diagnosis not present

## 2020-06-20 DIAGNOSIS — L905 Scar conditions and fibrosis of skin: Secondary | ICD-10-CM | POA: Diagnosis not present

## 2020-06-20 DIAGNOSIS — Z85828 Personal history of other malignant neoplasm of skin: Secondary | ICD-10-CM | POA: Diagnosis not present

## 2020-06-20 DIAGNOSIS — R0602 Shortness of breath: Secondary | ICD-10-CM | POA: Diagnosis not present

## 2020-06-20 DIAGNOSIS — R059 Cough, unspecified: Secondary | ICD-10-CM

## 2020-06-20 DIAGNOSIS — M3500 Sicca syndrome, unspecified: Secondary | ICD-10-CM

## 2020-06-20 DIAGNOSIS — J479 Bronchiectasis, uncomplicated: Secondary | ICD-10-CM | POA: Diagnosis not present

## 2020-06-20 DIAGNOSIS — R0609 Other forms of dyspnea: Secondary | ICD-10-CM

## 2020-06-20 DIAGNOSIS — L570 Actinic keratosis: Secondary | ICD-10-CM | POA: Diagnosis not present

## 2020-06-20 MED ORDER — ALBUTEROL SULFATE (2.5 MG/3ML) 0.083% IN NEBU: 2.5000 mg | INHALATION_SOLUTION | Freq: Four times a day (QID) | RESPIRATORY_TRACT | 3 refills | Status: DC | PRN

## 2020-06-20 MED ORDER — ALBUTEROL SULFATE HFA 108 (90 BASE) MCG/ACT IN AERS: 2.0000 | INHALATION_SPRAY | Freq: Four times a day (QID) | RESPIRATORY_TRACT | 6 refills | Status: DC | PRN

## 2020-06-20 MED ORDER — ANORO ELLIPTA 62.5-25 MCG/INH IN AEPB: 1.0000 | INHALATION_SPRAY | Freq: Every day | RESPIRATORY_TRACT | 3 refills | Status: DC

## 2020-06-20 NOTE — Patient Instructions (Signed)
Thank you for visiting Dr. Valeta Harms at [facility]. Today we recommend the following:  Meds ordered this encounter  Medications  . albuterol (VENTOLIN HFA) 108 (90 Base) MCG/ACT inhaler    Sig: Inhale 2 puffs into the lungs every 6 (six) hours as needed for wheezing or shortness of breath.    Dispense:  1 each    Refill:  6  . albuterol (PROVENTIL) (2.5 MG/3ML) 0.083% nebulizer solution    Sig: Take 3 mLs (2.5 mg total) by nebulization every 6 (six) hours as needed for wheezing or shortness of breath.    Dispense:  600 mL    Refill:  3  . ANORO ELLIPTA 62.5-25 MCG/INH AEPB    Sig: Inhale 1 puff into the lungs daily.    Dispense:  90 each    Refill:  3   Return in about 1 year (around 06/20/2021) for with APP or Dr. Valeta Harms.    Please do your part to reduce the spread of COVID-19.

## 2020-06-20 NOTE — Progress Notes (Signed)
Synopsis: Referred in November 2020 former patient Dr. Lake Bells, bronchiectasis.  PCP: Burnard Bunting, MD  Subjective:   PATIENT ID: Aimee Brown GENDER: female DOB: 1941/02/22, MRN: [id number]  Chief Complaint  Patient presents with  . Follow-up    SOB, dry and productive cough with clear and green mucus. Both same since last visit.  Albuterol neb refills    This is a 80 year old female past medical history of hyperlipidemia, hypertension former patient Dr. Lake Bells followed for bronchiectasis and COPD.  Pulmonary function test June 2019 revealed severe airflow obstruction, FEV1 1.0 L, 48% predicted, no significant bronchodilator response, RV/TLC ratio 163%, DLCO 54%.  Patient is a lifelong non-smoker.  She does have secondhand smoke exposure from a previous job.  Patient has been doing well from a respiratory standpoint. She does still get dyspneic on exertion going from the grocery store to the car.  She will have to find sometimes a place to sit and rest.  She has been using her Anoro Ellipta regularly.  She uses her nebulizer regularly.  She does need new tubing and accessories for her nebulizer machine.  She has a crack in the cup and it leaks currently.  Patient does not have any significant sputum production, fevers chills night sweats weight loss.  She uses her flutter valve to help clear her airway.  She feels that her postural drainage as well as a flutter valve is working well for her at this time.  OV 03/14/2020: Patient here today for follow-up regarding bronchiectasis, COPD.  She has severe airflow obstruction on prior pulmonary function tests.  She does have dyspnea on exertion currently managed with LABA/LAMA.  She uses her albuterol as needed.  She does state that she is having increased dyspnea on exertion as well as nocturnal coughing.  She does have sputum production.  She does seem to have progression of symptoms as the last time we have met.  We discussed this today in detail  denies hemoptysis.  OV 06/20/2020: Here today for follow-up regarding bronchiectasis and COPD.  Currently managed on a LABA/LAMA.  Her p.m. pulmonary function tests do show severe airflow obstruction.  She is doing well with her current inhaler regimen.  She needs refills of these medications today Anoro plus albuterol.  Currently using a nebulizer twice a day.  Trying to get out and walk more.  She completed 3 months of azithromycin Monday Wednesday Friday did not see much difference in her symptoms and she had associated diarrhea so medication was stopped.   Past Medical History:  Diagnosis Date  . Depression   . Hypercholesteremia   . Hypertension   . Long-term use of high-risk medication 03/07/2020     Family History  Problem Relation Age of Onset  . Breast cancer Paternal Aunt   . Breast cancer Maternal Grandmother   . Heart disease Mother   . Heart disease Father   . Scleroderma Brother      Past Surgical History:  Procedure Laterality Date  . ABDOMINAL HYSTERECTOMY    . CERVICAL LAMINECTOMY    . CESAREAN SECTION    . KIDNEY STONE SURGERY    . LEFT HEART CATH AND CORONARY ANGIOGRAPHY N/A 07/10/2017   Procedure: LEFT HEART CATH AND CORONARY ANGIOGRAPHY;  Surgeon: Leonie Man, MD;  Location: [facility];  Service: Cardiovascular;  Laterality: N/A;  . SALPINGOOPHORECTOMY    . TONSILLECTOMY      Social History   Socioeconomic History  . Marital status: Married  Spouse name: Not on file  . Number of children: Not on file  . Years of education: Not on file  . Highest education level: Not on file  Occupational History  . Not on file  Tobacco Use  . Smoking status: Never Smoker  . Smokeless tobacco: Never Used  Substance and Sexual Activity  . Alcohol use: No  . Drug use: Never  . Sexual activity: Not on file  Other Topics Concern  . Not on file  Social History Narrative  . Not on file   Social Determinants of Health   Financial Resource Strain: Not  on file  Food Insecurity: Not on file  Transportation Needs: Not on file  Physical Activity: Not on file  Stress: Not on file  Social Connections: Not on file  Intimate Partner Violence: Not on file     No Known Allergies   Outpatient Medications Prior to Visit  Medication Sig Dispense Refill  . albuterol (PROVENTIL HFA;VENTOLIN HFA) 108 (90 Base) MCG/ACT inhaler Inhale 2 puffs into the lungs every 6 (six) hours as needed for wheezing or shortness of breath. 1 Inhaler 6  . albuterol (PROVENTIL) (2.5 MG/3ML) 0.083% nebulizer solution Take 3 mLs (2.5 mg total) by nebulization every 6 (six) hours as needed for wheezing or shortness of breath. 75 mL 12  . ANORO ELLIPTA 62.5-25 MCG/INH AEPB Inhale 1 puff into the lungs daily.    . Cholecalciferol (VITAMIN D PO) Take 1 tablet by mouth daily.    . hydroxychloroquine (PLAQUENIL) 200 MG tablet Take by mouth.    . levothyroxine (SYNTHROID, LEVOTHROID) 112 MCG tablet Take 137 mcg by mouth daily.    Marland Kitchen omeprazole (PRILOSEC) 20 MG capsule Take 20 mg by mouth daily.  1  . ramipril (ALTACE) 10 MG capsule Take 10 mg by mouth daily.  3  . sertraline (ZOLOFT) 50 MG tablet Take 50 mg by mouth daily.    Marland Kitchen umeclidinium-vilanterol (ANORO ELLIPTA) 62.5-25 MCG/INH AEPB Inhale 1 puff into the lungs daily. 7 each 0  . sodium chloride HYPERTONIC 3 % nebulizer solution Take by nebulization in the morning and at bedtime. J47.9 750 mL 12  . TOLAK 4 % CREA SMARTSIG:1 Sparingly Topical Daily     No facility-administered medications prior to visit.    Review of Systems  Constitutional: Negative for chills, fever, malaise/fatigue and weight loss.  HENT: Negative for hearing loss, sore throat and tinnitus.   Eyes: Negative for blurred vision and double vision.  Respiratory: Positive for cough and shortness of breath. Negative for hemoptysis, sputum production, wheezing and stridor.   Cardiovascular: Negative for chest pain, palpitations, orthopnea, leg swelling and  PND.  Gastrointestinal: Negative for abdominal pain, constipation, diarrhea, heartburn, nausea and vomiting.  Genitourinary: Negative for dysuria, hematuria and urgency.  Musculoskeletal: Negative for joint pain and myalgias.  Skin: Negative for itching and rash.  Neurological: Negative for dizziness, tingling, weakness and headaches.  Endo/Heme/Allergies: Negative for environmental allergies. Does not bruise/bleed easily.  Psychiatric/Behavioral: Negative for depression. The patient is not nervous/anxious and does not have insomnia.   All other systems reviewed and are negative.    Objective:  Physical Exam Vitals reviewed.  Constitutional:      General: She is not in acute distress.    Appearance: She is well-developed.  HENT:     Head: Normocephalic and atraumatic.  Eyes:     General: No scleral icterus.    Conjunctiva/sclera: Conjunctivae normal.     Pupils: Pupils are equal, round, and  reactive to light.  Neck:     Vascular: No JVD.     Trachea: No tracheal deviation.  Cardiovascular:     Rate and Rhythm: Normal rate and regular rhythm.     Heart sounds: Normal heart sounds. No murmur heard.   Pulmonary:     Effort: Pulmonary effort is normal. No tachypnea, accessory muscle usage or respiratory distress.     Breath sounds: No stridor. No wheezing, rhonchi or rales.  Abdominal:     General: Bowel sounds are normal. There is no distension.     Palpations: Abdomen is soft.     Tenderness: There is no abdominal tenderness.  Musculoskeletal:        General: No tenderness.     Cervical back: Neck supple.  Lymphadenopathy:     Cervical: No cervical adenopathy.  Skin:    General: Skin is warm and dry.     Capillary Refill: Capillary refill takes less than 2 seconds.     Findings: No rash.  Neurological:     Mental Status: She is alert and oriented to person, place, and time.  Psychiatric:        Behavior: Behavior normal.      Vitals:   06/20/20 1630  BP: 126/80   Pulse: 78  Temp: (!) 97.4 F (36.3 C)  SpO2: 96%  Weight: 170 lb (77.1 kg)  Height: '5\' 3"'  (1.6 m)   96% on RA BMI Readings from Last 3 Encounters:  06/20/20 30.11 kg/m  03/14/20 30.47 kg/m  01/28/19 29.26 kg/m   Wt Readings from Last 3 Encounters:  06/20/20 170 lb (77.1 kg)  03/14/20 172 lb (78 kg)  01/28/19 165 lb 3.2 oz (74.9 kg)     CBC No results found for: WBC, RBC, HGB, HCT, PLT, MCV, MCH, MCHC, RDW, LYMPHSABS, MONOABS, EOSABS, BASOSABS  December 2021: Sputum cultures negative, AFB negative, fungus negative except for normal flora Candida  Chest Imaging:  July 2019: High-resolution CT scan of the chest. Bilateral bronchiectasis. The patient's images have been independently reviewed by me.    Pulmonary Functions Testing Results: PFT Results Latest Ref Rng & Units 08/19/2017  FVC-Pre L 1.33  FVC-Predicted Pre % 48  FVC-Post L 1.45  FVC-Predicted Post % 52  Pre FEV1/FVC % % 72  Post FEV1/FCV % % 69  FEV1-Pre L 0.96  FEV1-Predicted Pre % 46  FEV1-Post L 1.00  DLCO uncorrected ml/min/mmHg 13.16  DLCO UNC% % 54  DLVA Predicted % 100  TLC L 5.47  TLC % Predicted % 108  RV % Predicted % 177    FeNO: None   Pathology: None   Echocardiogram: None   Heart Catheterization:  07/10/2017 left heart catheterization:  The left ventricular systolic function is normal. EF 55-65% by visual estimate.  LV end diastolic pressure is moderately elevated.  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 otherwise essentially angiographically normal coronaries    Assessment & Plan:     ICD-10-CM   1. Bronchiectasis without complication (HCC)  T51.7 albuterol (PROVENTIL) (2.5 MG/3ML) 0.083% nebulizer solution  2. 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 Chronic obstructive pulmonary disease, unspecified COPD type (HCC)  J44.9 albuterol (PROVENTIL) (2.5 MG/3ML) 0.083% nebulizer solution  4. Cough  R05.9    5. DOE (dyspnea on exertion)  R06.00   6. SOB (shortness of breath)  R06.02     Discussion:  This is a 80 year old female, history of  bronchiectasis, Sjogren's syndrome on Plaquenil, maintain with bronchodilators, albuterol as needed and Anoro Ellipta.  Also occasionally using flutter valve.  Trial of azithromycin for 3 months did not change symptoms.  This was discontinued.  Plan: Prior sputum cultures reviewed today negative Continue Plaquenil Continue Anoro Ellipta new prescriptions for these New prescription for albuterol solution. Encouraged exercise and increased exercise tolerance. Patient getting out walking with her husband is much as possible. Agree with holding azithromycin at this time.  If symptoms change or patient's exacerbates would consider repeat sputum cultures prior to antimicrobials.    Current Outpatient Medications:  .  albuterol (PROVENTIL HFA;VENTOLIN HFA) 108 (90 Base) MCG/ACT inhaler, Inhale 2 puffs into the lungs every 6 (six) hours as needed for wheezing or shortness of breath., Disp: 1 Inhaler, Rfl: 6 .  albuterol (PROVENTIL) (2.5 MG/3ML) 0.083% nebulizer solution, Take 3 mLs (2.5 mg total) by nebulization every 6 (six) hours as needed for wheezing or shortness of breath., Disp: 75 mL, Rfl: 12 .  ANORO ELLIPTA 62.5-25 MCG/INH AEPB, Inhale 1 puff into the lungs daily., Disp: , Rfl:  .  Cholecalciferol (VITAMIN D PO), Take 1 tablet by mouth daily., Disp: , Rfl:  .  hydroxychloroquine (PLAQUENIL) 200 MG tablet, Take by mouth., Disp: , Rfl:  .  levothyroxine (SYNTHROID, LEVOTHROID) 112 MCG tablet, Take 137 mcg by mouth daily., Disp: , Rfl:  .  omeprazole (PRILOSEC) 20 MG capsule, Take 20 mg by mouth daily., Disp: , Rfl: 1 .  ramipril (ALTACE) 10 MG capsule, Take 10 mg by mouth daily., Disp: , Rfl: 3 .  sertraline (ZOLOFT) 50 MG tablet, Take 50 mg by mouth daily., Disp: , Rfl:  .  umeclidinium-vilanterol (ANORO ELLIPTA) 62.5-25 MCG/INH AEPB, Inhale 1 puff  into the lungs daily., Disp: 7 each, Rfl: 0    Garner Nash, DO [facility] 06/20/2020 4:52 PM

## 2020-07-25 DIAGNOSIS — H10501 Unspecified blepharoconjunctivitis, right eye: Secondary | ICD-10-CM | POA: Diagnosis not present

## 2020-07-27 DIAGNOSIS — E039 Hypothyroidism, unspecified: Secondary | ICD-10-CM | POA: Diagnosis not present

## 2020-07-27 DIAGNOSIS — E785 Hyperlipidemia, unspecified: Secondary | ICD-10-CM | POA: Diagnosis not present

## 2020-08-03 DIAGNOSIS — K219 Gastro-esophageal reflux disease without esophagitis: Secondary | ICD-10-CM | POA: Diagnosis not present

## 2020-08-03 DIAGNOSIS — E039 Hypothyroidism, unspecified: Secondary | ICD-10-CM | POA: Diagnosis not present

## 2020-08-03 DIAGNOSIS — M5412 Radiculopathy, cervical region: Secondary | ICD-10-CM | POA: Diagnosis not present

## 2020-08-03 DIAGNOSIS — I10 Essential (primary) hypertension: Secondary | ICD-10-CM | POA: Diagnosis not present

## 2020-08-03 DIAGNOSIS — E785 Hyperlipidemia, unspecified: Secondary | ICD-10-CM | POA: Diagnosis not present

## 2020-08-03 DIAGNOSIS — R82998 Other abnormal findings in urine: Secondary | ICD-10-CM | POA: Diagnosis not present

## 2020-08-03 DIAGNOSIS — J449 Chronic obstructive pulmonary disease, unspecified: Secondary | ICD-10-CM | POA: Diagnosis not present

## 2020-08-03 DIAGNOSIS — Z0000 Encounter for general adult medical examination without abnormal findings: Secondary | ICD-10-CM | POA: Diagnosis not present

## 2020-08-03 DIAGNOSIS — E663 Overweight: Secondary | ICD-10-CM | POA: Diagnosis not present

## 2020-08-03 DIAGNOSIS — Z1212 Encounter for screening for malignant neoplasm of rectum: Secondary | ICD-10-CM | POA: Diagnosis not present

## 2020-08-03 DIAGNOSIS — M3500 Sicca syndrome, unspecified: Secondary | ICD-10-CM | POA: Diagnosis not present

## 2020-08-03 DIAGNOSIS — Z1339 Encounter for screening examination for other mental health and behavioral disorders: Secondary | ICD-10-CM | POA: Diagnosis not present

## 2020-08-03 DIAGNOSIS — Z1331 Encounter for screening for depression: Secondary | ICD-10-CM | POA: Diagnosis not present

## 2020-08-03 DIAGNOSIS — I700 Atherosclerosis of aorta: Secondary | ICD-10-CM | POA: Diagnosis not present

## 2020-08-04 DIAGNOSIS — H10501 Unspecified blepharoconjunctivitis, right eye: Secondary | ICD-10-CM | POA: Diagnosis not present

## 2020-08-17 ENCOUNTER — Telehealth: Payer: Self-pay | Admitting: Pulmonary Disease

## 2020-08-17 NOTE — Telephone Encounter (Signed)
ATC Patient.  LM to call back. 

## 2020-08-18 MED ORDER — ANORO ELLIPTA 62.5-25 MCG/INH IN AEPB: 1.0000 | INHALATION_SPRAY | Freq: Every day | RESPIRATORY_TRACT | 11 refills | Status: AC

## 2020-08-18 NOTE — Telephone Encounter (Signed)
Spoke with pt  Advised that we sent her pharm a 90 day supply with 3 rf for Anoro 06/20/20  She states pharmacy advised her that they did not have anything on file  I sent new rx for 30 days with 11 rf per pt request  Nothing further needed

## 2020-09-21 DIAGNOSIS — R053 Chronic cough: Secondary | ICD-10-CM | POA: Diagnosis not present

## 2020-09-21 DIAGNOSIS — Z1152 Encounter for screening for COVID-19: Secondary | ICD-10-CM | POA: Diagnosis not present

## 2020-09-21 DIAGNOSIS — J029 Acute pharyngitis, unspecified: Secondary | ICD-10-CM | POA: Diagnosis not present

## 2020-09-21 DIAGNOSIS — J449 Chronic obstructive pulmonary disease, unspecified: Secondary | ICD-10-CM | POA: Diagnosis not present

## 2020-09-21 DIAGNOSIS — M542 Cervicalgia: Secondary | ICD-10-CM | POA: Diagnosis not present

## 2020-09-21 DIAGNOSIS — H9202 Otalgia, left ear: Secondary | ICD-10-CM | POA: Diagnosis not present

## 2020-10-16 DIAGNOSIS — M1990 Unspecified osteoarthritis, unspecified site: Secondary | ICD-10-CM | POA: Diagnosis not present

## 2020-10-16 DIAGNOSIS — E039 Hypothyroidism, unspecified: Secondary | ICD-10-CM | POA: Diagnosis not present

## 2020-10-16 DIAGNOSIS — E785 Hyperlipidemia, unspecified: Secondary | ICD-10-CM | POA: Diagnosis not present

## 2020-10-16 DIAGNOSIS — I10 Essential (primary) hypertension: Secondary | ICD-10-CM | POA: Diagnosis not present

## 2020-10-17 DIAGNOSIS — L905 Scar conditions and fibrosis of skin: Secondary | ICD-10-CM | POA: Diagnosis not present

## 2020-10-17 DIAGNOSIS — Z85828 Personal history of other malignant neoplasm of skin: Secondary | ICD-10-CM | POA: Diagnosis not present

## 2020-10-17 DIAGNOSIS — L0100 Impetigo, unspecified: Secondary | ICD-10-CM | POA: Diagnosis not present

## 2020-11-08 ENCOUNTER — Other Ambulatory Visit: Payer: Self-pay | Admitting: *Deleted

## 2020-11-08 DIAGNOSIS — J479 Bronchiectasis, uncomplicated: Secondary | ICD-10-CM

## 2020-11-08 DIAGNOSIS — J449 Chronic obstructive pulmonary disease, unspecified: Secondary | ICD-10-CM

## 2020-11-08 MED ORDER — ALBUTEROL SULFATE (2.5 MG/3ML) 0.083% IN NEBU: 2.5000 mg | INHALATION_SOLUTION | Freq: Four times a day (QID) | RESPIRATORY_TRACT | 3 refills | Status: DC | PRN

## 2020-11-09 DIAGNOSIS — M3500 Sicca syndrome, unspecified: Secondary | ICD-10-CM | POA: Diagnosis not present

## 2020-11-09 DIAGNOSIS — Z6829 Body mass index (BMI) 29.0-29.9, adult: Secondary | ICD-10-CM | POA: Diagnosis not present

## 2020-11-09 DIAGNOSIS — I7300 Raynaud's syndrome without gangrene: Secondary | ICD-10-CM | POA: Diagnosis not present

## 2020-11-09 DIAGNOSIS — R0609 Other forms of dyspnea: Secondary | ICD-10-CM | POA: Diagnosis not present

## 2020-11-09 DIAGNOSIS — E663 Overweight: Secondary | ICD-10-CM | POA: Diagnosis not present

## 2020-11-09 DIAGNOSIS — M150 Primary generalized (osteo)arthritis: Secondary | ICD-10-CM | POA: Diagnosis not present

## 2020-12-07 ENCOUNTER — Other Ambulatory Visit: Payer: Self-pay

## 2020-12-07 ENCOUNTER — Encounter (INDEPENDENT_AMBULATORY_CARE_PROVIDER_SITE_OTHER): Payer: Self-pay | Admitting: Ophthalmology

## 2020-12-07 ENCOUNTER — Ambulatory Visit (INDEPENDENT_AMBULATORY_CARE_PROVIDER_SITE_OTHER): Payer: PPO | Admitting: Ophthalmology

## 2020-12-07 DIAGNOSIS — H179 Unspecified corneal scar and opacity: Secondary | ICD-10-CM

## 2020-12-07 DIAGNOSIS — Z961 Presence of intraocular lens: Secondary | ICD-10-CM | POA: Diagnosis not present

## 2020-12-07 DIAGNOSIS — Z79899 Other long term (current) drug therapy: Secondary | ICD-10-CM

## 2020-12-07 DIAGNOSIS — H43813 Vitreous degeneration, bilateral: Secondary | ICD-10-CM

## 2020-12-07 NOTE — Assessment & Plan Note (Signed)

## 2020-12-07 NOTE — Progress Notes (Signed)
12/07/2020     CHIEF COMPLAINT Patient presents for  Chief Complaint  Patient presents with   Retina Follow Up    9 MO FU OU   Pt reports blurry vision OD, some burning and FB sensation OD, no pain or pressure.       HISTORY OF PRESENT ILLNESS: Aimee Brown is a 80 y.o. female who presents to the clinic today for:   HPI     Retina Follow Up   Patient presents with  Other.  In both eyes.  This started 9 months ago.  Duration of 9 months. Additional comments: 9 MO FU OU   Pt reports blurry vision OD, some burning and FB sensation OD, no pain or pressure.         Comments   9 mos du ou oct fp Pt states her right eye seems to be out of focus and blurred. She states she has had an eye infection all Summer. She is currently using loteprednol OD tid for 6 weeks so far, and was told by Dr. Gershon Crane to see a cornea specialist which she has an appt with next Monday. Pt states she has floaters occasionally OD, longstanding and stable, "they started after cataract surgery."      Last edited by Laurin Coder on 12/07/2020  1:13 PM.      Referring physician: Rutherford Guys, MD [place],  [place] [id number]  HISTORICAL INFORMATION:   Selected notes from the [facility]: No current outpatient medications on file. (Ophthalmic Drugs)   No current facility-administered medications for this visit. (Ophthalmic Drugs)   Current Outpatient Medications (Other)  Medication Sig   albuterol (PROVENTIL) (2.5 MG/3ML) 0.083% nebulizer solution Take 3 mLs (2.5 mg total) by nebulization every 6 (six) hours as needed for wheezing or shortness of breath.   albuterol (VENTOLIN HFA) 108 (90 Base) MCG/ACT inhaler Inhale 2 puffs into the lungs every 6 (six) hours as needed for wheezing or shortness of breath.   ANORO ELLIPTA 62.5-25 MCG/INH AEPB Inhale 1 puff into the lungs daily.   Cholecalciferol (VITAMIN D PO) Take 1 tablet by mouth  daily.   hydroxychloroquine (PLAQUENIL) 200 MG tablet Take by mouth.   levothyroxine (SYNTHROID, LEVOTHROID) 112 MCG tablet Take 137 mcg by mouth daily.   omeprazole (PRILOSEC) 20 MG capsule Take 20 mg by mouth daily.   ramipril (ALTACE) 10 MG capsule Take 10 mg by mouth daily.   sertraline (ZOLOFT) 50 MG tablet Take 50 mg by mouth daily.   umeclidinium-vilanterol (ANORO ELLIPTA) 62.5-25 MCG/INH AEPB Inhale 1 puff into the lungs daily.   No current facility-administered medications for this visit. (Other)      REVIEW OF SYSTEMS:    ALLERGIES No Known Allergies  PAST MEDICAL HISTORY Past Medical History:  Diagnosis Date   Depression    Hypercholesteremia    Hypertension    Long-term use of high-risk medication 03/07/2020   Past Surgical History:  Procedure Laterality Date   ABDOMINAL HYSTERECTOMY     CERVICAL LAMINECTOMY     CESAREAN SECTION     KIDNEY STONE SURGERY     LEFT HEART CATH AND CORONARY ANGIOGRAPHY N/A 07/10/2017   Procedure: LEFT HEART CATH AND CORONARY ANGIOGRAPHY;  Surgeon: Leonie Man, MD;  Location: [facility];  Service: Cardiovascular;  Laterality: N/A;   SALPINGOOPHORECTOMY     TONSILLECTOMY      FAMILY HISTORY Family  History  Problem Relation Age of Onset   Breast cancer Paternal Aunt    Breast cancer Maternal Grandmother    Heart disease Mother    Heart disease Father    Scleroderma Brother     SOCIAL HISTORY Social History   Tobacco Use   Smoking status: Never   Smokeless tobacco: Never  Substance Use Topics   Alcohol use: No   Drug use: Never         OPHTHALMIC EXAM:  Base Eye Exam     Visual Acuity (Snellen - Linear)       Right Left   Dist cc 20/40 -1 20/30 -1+2   Dist ph cc NI          Tonometry (Tonopen, 1:15 PM)       Right Left   Pressure 13 13         Pupils       Pupils Dark Light Shape React APD   Right PERRL 4 3 Round Brisk None   Left PERRL 4 3 Round Brisk None          Extraocular Movement       Right Left    Full Full         Neuro/Psych     Oriented x3: Yes   Mood/Affect: Normal         Dilation     Both eyes: 1.0% Mydriacyl, 2.5% Phenylephrine @ 1:15 PM           Slit Lamp and Fundus Exam     External Exam       Right Left   External Normal Normal         Slit Lamp Exam       Right Left   Lids/Lashes Normal Normal   Conjunctiva/Sclera White and quiet White and quiet   Cornea Corneal stromal opacity at an inferior to the visual axis Clear   Anterior Chamber Deep and quiet Deep and quiet   Iris Round and reactive Round and reactive   Lens Centered posterior chamber intraocular lens, Open posterior capsule Centered posterior chamber intraocular lens, Open posterior capsule   Anterior Vitreous Normal Normal         Fundus Exam       Right Left   Posterior Vitreous Posterior vitreous detachment Posterior vitreous detachment   Disc Normal White fibrous nonproliferative tissue over nerve head   C/D Ratio 0.05 0.0   Macula Normal, no pigmentary changes Normal, no pigmentary changes   Vessels Normal Normal   Periphery Normal Normal            IMAGING AND PROCEDURES  Imaging and Procedures for 12/07/20  OCT, Retina - OU - Both Eyes       Right Eye Central Foveal Thickness: 311. Progression has been stable. Findings include normal foveal contour.   Left Eye Central Foveal Thickness: 307. Progression has been stable. Findings include normal foveal contour.   Notes Normal foveal contour, no signs of inner retina or outer retinal pigment epithelial atrophy.     Color Fundus Photography Optos - OU - Both Eyes       Right Eye Progression has no prior data. Disc findings include normal observations. Macula : normal observations. Vessels : normal observations. Periphery : normal observations.   Left Eye Progression has no prior data. Disc findings include normal observations. Macula : normal observations.  Vessels : normal observations. Periphery : normal observations.  ASSESSMENT/PLAN:  Long-term use of high-risk medication Patient continues on Plaquenil 200 mg p.o. twice daily  There are no signs of retinal ophthalmic toxicity to date.  By OCT testing as well as by color fundus photography and clinical findings.  Posterior vitreous detachment of both eyes  The nature of posterior vitreous detachment was discussed with the patient as well as its physiology, its age prevalence, and its possible implication regarding retinal breaks and detachment.  An informational brochure was offered to the patient.  All the patient's questions were answered.  The patient was asked to return if new or different flashes or floaters develops.   Patient was instructed to contact office immediately if any new changes were noticed. I explained to the patient that vitreous inside the eye is similar to jello inside a bowl. As the jello melts it can start to pull away from the bowl, similarly the vitreous throughout our lives can begin to pull away from the retina. That process is called a posterior vitreous detachment. In some cases, the vitreous can tug hard enough on the retina to form a retinal tear. I discussed with the patient the signs and symptoms of a retinal detachment.  Do not rub the eye.    Pseudophakia of both eyes Looks great OU  Corneal scar, right eye No active epithelial or stromal inflammation no cells in the stroma yet old white scar inferior to the visual axis could be inducing irregular astigmatism refractive error     ICD-10-CM   1. Long-term use of high-risk medication  Z79.899 OCT, Retina - OU - Both Eyes    Color Fundus Photography Optos - OU - Both Eyes    2. Posterior vitreous detachment of both eyes  H43.813     3. Pseudophakia of both eyes  Z96.1     4. Corneal scar, right eye  H17.9       1.  OD with blurred vision which may be from irregular corneal scar.  I  agree with Dr. Gershon Crane corneal consultation would be appropriate  2.  No signs of retinal changes or macular changes from long-term use of Plaquenil therapy  3.  Ophthalmic Meds Ordered this visit:  No orders of the defined types were placed in this encounter.      Return in about 9 months (around 09/06/2021) for DILATE OU, COLOR FP, OCT.  There are no Patient Instructions on file for this visit.   Explained the diagnoses, plan, and follow up with the patient and they expressed understanding.  Patient expressed understanding of the importance of proper follow up care.   Clent Demark Fidel Caggiano M.D. Diseases & Surgery of the Retina and Vitreous [facility]ina [facility] [place] 12/07/20     Abbreviations: M myopia (nearsighted); A astigmatism; H hyperopia (farsighted); P presbyopia; Mrx spectacle prescription;  CTL contact lenses; OD right eye; OS left eye; OU both eyes  XT exotropia; ET esotropia; PEK punctate epithelial keratitis; PEE punctate epithelial erosions; DES dry eye syndrome; MGD meibomian gland dysfunction; ATs artificial tears; PFAT's preservative free artificial tears; [place] nuclear sclerotic cataract; PSC posterior subcapsular cataract; ERM epi-retinal membrane; PVD posterior vitreous detachment; RD retinal detachment; DM diabetes mellitus; DR diabetic retinopathy; NPDR non-proliferative diabetic retinopathy; PDR proliferative diabetic retinopathy; CSME clinically significant macular edema; DME diabetic macular edema; dbh dot blot hemorrhages; CWS cotton wool spot; POAG primary open angle glaucoma; C/D cup-to-disc ratio; HVF humphrey visual field; GVF goldmann visual field; OCT optical coherence tomography; IOP intraocular pressure; BRVO Branch  retinal vein occlusion; CRVO central retinal vein occlusion; CRAO central retinal artery occlusion; BRAO branch retinal artery occlusion; RT retinal tear; SB scleral buckle; PPV pars plana vitrectomy; VH Vitreous hemorrhage; PRP panretinal laser  photocoagulation; IVK intravitreal kenalog; VMT vitreomacular traction; MH Macular hole;  NVD neovascularization of the disc; NVE neovascularization elsewhere; AREDS age related eye disease study; ARMD age related macular degeneration; POAG primary open angle glaucoma; EBMD epithelial/anterior basement membrane dystrophy; ACIOL anterior chamber intraocular lens; IOL intraocular lens; PCIOL posterior chamber intraocular lens; Phaco/IOL phacoemulsification with intraocular lens placement; [place] photorefractive keratectomy; LASIK laser assisted in situ keratomileusis; HTN hypertension; DM diabetes mellitus; COPD chronic obstructive pulmonary disease

## 2020-12-07 NOTE — Assessment & Plan Note (Signed)
No active epithelial or stromal inflammation no cells in the stroma yet old white scar inferior to the visual axis could be inducing irregular astigmatism refractive error

## 2020-12-07 NOTE — Assessment & Plan Note (Signed)
Patient continues on Plaquenil 200 mg p.o. twice daily  There are no signs of retinal ophthalmic toxicity to date.  By OCT testing as well as by color fundus photography and clinical findings.

## 2020-12-07 NOTE — Assessment & Plan Note (Signed)
Looks great OU 

## 2020-12-12 DIAGNOSIS — H02132 Senile ectropion of right lower eyelid: Secondary | ICD-10-CM | POA: Diagnosis not present

## 2020-12-13 DIAGNOSIS — Z01818 Encounter for other preprocedural examination: Secondary | ICD-10-CM | POA: Diagnosis not present

## 2020-12-13 DIAGNOSIS — H02132 Senile ectropion of right lower eyelid: Secondary | ICD-10-CM | POA: Diagnosis not present

## 2020-12-17 DIAGNOSIS — Z23 Encounter for immunization: Secondary | ICD-10-CM | POA: Diagnosis not present

## 2020-12-28 DIAGNOSIS — H02103 Unspecified ectropion of right eye, unspecified eyelid: Secondary | ICD-10-CM | POA: Diagnosis not present

## 2020-12-28 DIAGNOSIS — H02132 Senile ectropion of right lower eyelid: Secondary | ICD-10-CM | POA: Diagnosis not present

## 2021-01-11 DIAGNOSIS — H6505 Acute serous otitis media, recurrent, left ear: Secondary | ICD-10-CM | POA: Diagnosis not present

## 2021-01-23 DIAGNOSIS — Z9622 Myringotomy tube(s) status: Secondary | ICD-10-CM | POA: Diagnosis not present

## 2021-01-23 DIAGNOSIS — H6522 Chronic serous otitis media, left ear: Secondary | ICD-10-CM | POA: Diagnosis not present

## 2021-01-23 DIAGNOSIS — H6982 Other specified disorders of Eustachian tube, left ear: Secondary | ICD-10-CM | POA: Diagnosis not present

## 2021-02-13 DIAGNOSIS — K219 Gastro-esophageal reflux disease without esophagitis: Secondary | ICD-10-CM | POA: Diagnosis not present

## 2021-02-13 DIAGNOSIS — M1990 Unspecified osteoarthritis, unspecified site: Secondary | ICD-10-CM | POA: Diagnosis not present

## 2021-02-13 DIAGNOSIS — E663 Overweight: Secondary | ICD-10-CM | POA: Diagnosis not present

## 2021-02-13 DIAGNOSIS — M3500 Sicca syndrome, unspecified: Secondary | ICD-10-CM | POA: Diagnosis not present

## 2021-02-13 DIAGNOSIS — I700 Atherosclerosis of aorta: Secondary | ICD-10-CM | POA: Diagnosis not present

## 2021-02-13 DIAGNOSIS — E039 Hypothyroidism, unspecified: Secondary | ICD-10-CM | POA: Diagnosis not present

## 2021-02-13 DIAGNOSIS — M109 Gout, unspecified: Secondary | ICD-10-CM | POA: Diagnosis not present

## 2021-02-21 DIAGNOSIS — Z9622 Myringotomy tube(s) status: Secondary | ICD-10-CM | POA: Diagnosis not present

## 2021-02-21 DIAGNOSIS — H6982 Other specified disorders of Eustachian tube, left ear: Secondary | ICD-10-CM | POA: Diagnosis not present

## 2021-02-21 DIAGNOSIS — Z8669 Personal history of other diseases of the nervous system and sense organs: Secondary | ICD-10-CM | POA: Diagnosis not present

## 2021-04-05 ENCOUNTER — Telehealth: Payer: Self-pay | Admitting: Pulmonary Disease

## 2021-04-05 DIAGNOSIS — J479 Bronchiectasis, uncomplicated: Secondary | ICD-10-CM

## 2021-04-05 NOTE — Telephone Encounter (Signed)
Spoke with the pt  She states that she needs supplies for CPAP- tubing and cups  Order sent   She states she has been using the albuterol nebs BID for years now  She has had a lot of dental issues lately, tooth decay, and asks if albuterol can contribute to this  I advised that the albuterol should be taken only as needed but she says no one ever told her that  She used to be on hypertonic neb sol bid scheduled until pharm was unable to get med and that is when albuterol was given   Please advise thanks

## 2021-04-05 NOTE — Telephone Encounter (Signed)
Dr Valeta Harms- she is not on CPAP, I meant to type nebulizer in my original msg  I apologize   Forwarding to [facility] as FYI and to triage to we can call pt again tomorrow regarding answer to her dental question regarding albuterol.

## 2021-04-05 NOTE — Telephone Encounter (Signed)
Called patient but she did not answer. Left message for her to call back tomorrow.  

## 2021-04-06 NOTE — Telephone Encounter (Signed)
Ok for [employer] supplies  Who manages her CPAP??  No albuterol should not effect her teeth   Thanks   BLI   Pt notified of response per BI and she has already received call about her neb supplies being delivered. Nothing further needed.

## 2021-04-11 ENCOUNTER — Telehealth: Payer: Self-pay | Admitting: Pulmonary Disease

## 2021-04-11 DIAGNOSIS — J449 Chronic obstructive pulmonary disease, unspecified: Secondary | ICD-10-CM

## 2021-04-11 DIAGNOSIS — J479 Bronchiectasis, uncomplicated: Secondary | ICD-10-CM

## 2021-04-12 NOTE — Telephone Encounter (Signed)
Attempted to call pt but unable to reach. Unable to leave VM. Will try to call back later. °

## 2021-04-14 NOTE — Telephone Encounter (Signed)
New order has been placed patient has been made aware. Nothing further needed at this time.

## 2021-04-24 DIAGNOSIS — Z08 Encounter for follow-up examination after completed treatment for malignant neoplasm: Secondary | ICD-10-CM | POA: Diagnosis not present

## 2021-04-24 DIAGNOSIS — L570 Actinic keratosis: Secondary | ICD-10-CM | POA: Diagnosis not present

## 2021-04-24 DIAGNOSIS — L821 Other seborrheic keratosis: Secondary | ICD-10-CM | POA: Diagnosis not present

## 2021-04-24 DIAGNOSIS — L814 Other melanin hyperpigmentation: Secondary | ICD-10-CM | POA: Diagnosis not present

## 2021-04-24 DIAGNOSIS — Z85828 Personal history of other malignant neoplasm of skin: Secondary | ICD-10-CM | POA: Diagnosis not present

## 2021-04-25 ENCOUNTER — Other Ambulatory Visit: Payer: Self-pay | Admitting: Internal Medicine

## 2021-04-25 DIAGNOSIS — Z1231 Encounter for screening mammogram for malignant neoplasm of breast: Secondary | ICD-10-CM

## 2021-05-03 ENCOUNTER — Other Ambulatory Visit: Payer: Self-pay

## 2021-05-03 ENCOUNTER — Ambulatory Visit
Admission: RE | Admit: 2021-05-03 | Discharge: 2021-05-03 | Disposition: A | Discharge status: Home / Self Care | Payer: PPO | Source: Ambulatory Visit | Attending: Internal Medicine | Admitting: Internal Medicine

## 2021-05-03 DIAGNOSIS — Z1231 Encounter for screening mammogram for malignant neoplasm of breast: Secondary | ICD-10-CM | POA: Diagnosis not present

## 2021-05-08 DIAGNOSIS — H52203 Unspecified astigmatism, bilateral: Secondary | ICD-10-CM | POA: Diagnosis not present

## 2021-05-08 DIAGNOSIS — Z961 Presence of intraocular lens: Secondary | ICD-10-CM | POA: Diagnosis not present

## 2021-05-08 DIAGNOSIS — H524 Presbyopia: Secondary | ICD-10-CM | POA: Diagnosis not present

## 2021-05-08 DIAGNOSIS — Z79899 Other long term (current) drug therapy: Secondary | ICD-10-CM | POA: Diagnosis not present

## 2021-05-10 DIAGNOSIS — R0609 Other forms of dyspnea: Secondary | ICD-10-CM | POA: Diagnosis not present

## 2021-05-10 DIAGNOSIS — I7300 Raynaud's syndrome without gangrene: Secondary | ICD-10-CM | POA: Diagnosis not present

## 2021-05-10 DIAGNOSIS — Z6829 Body mass index (BMI) 29.0-29.9, adult: Secondary | ICD-10-CM | POA: Diagnosis not present

## 2021-05-10 DIAGNOSIS — M150 Primary generalized (osteo)arthritis: Secondary | ICD-10-CM | POA: Diagnosis not present

## 2021-05-10 DIAGNOSIS — E663 Overweight: Secondary | ICD-10-CM | POA: Diagnosis not present

## 2021-05-10 DIAGNOSIS — M3500 Sicca syndrome, unspecified: Secondary | ICD-10-CM | POA: Diagnosis not present

## 2021-06-05 DIAGNOSIS — J029 Acute pharyngitis, unspecified: Secondary | ICD-10-CM | POA: Diagnosis not present

## 2021-06-05 DIAGNOSIS — R5383 Other fatigue: Secondary | ICD-10-CM | POA: Diagnosis not present

## 2021-06-05 DIAGNOSIS — K1120 Sialoadenitis, unspecified: Secondary | ICD-10-CM | POA: Diagnosis not present

## 2021-06-05 DIAGNOSIS — R059 Cough, unspecified: Secondary | ICD-10-CM | POA: Diagnosis not present

## 2021-06-05 DIAGNOSIS — Z1152 Encounter for screening for COVID-19: Secondary | ICD-10-CM | POA: Diagnosis not present

## 2021-06-05 DIAGNOSIS — R0981 Nasal congestion: Secondary | ICD-10-CM | POA: Diagnosis not present

## 2021-06-05 DIAGNOSIS — I10 Essential (primary) hypertension: Secondary | ICD-10-CM | POA: Diagnosis not present

## 2021-07-03 ENCOUNTER — Ambulatory Visit: Payer: PPO | Admitting: Pulmonary Disease

## 2021-07-03 ENCOUNTER — Encounter: Payer: Self-pay | Admitting: Pulmonary Disease

## 2021-07-03 VITALS — BP 120/60 | HR 59 | Temp 98.1°F | Ht 63.0 in | Wt 166.6 lb

## 2021-07-03 DIAGNOSIS — J449 Chronic obstructive pulmonary disease, unspecified: Secondary | ICD-10-CM

## 2021-07-03 DIAGNOSIS — J479 Bronchiectasis, uncomplicated: Secondary | ICD-10-CM | POA: Diagnosis not present

## 2021-07-03 NOTE — Patient Instructions (Signed)
?  Thank you for visiting Dr. Valeta Harms at [facility]. ?Today we recommend the following: ? ?Continue current inhaler regimen and nebulizer regimen  ? ?Return in about 1 year (around 07/04/2022). ? ? ? ?Please do your part to reduce the spread of COVID-19.  ? ?

## 2021-07-03 NOTE — Progress Notes (Signed)
? ?Synopsis: Referred in November 2020 former patient Dr. Lake Bells, bronchiectasis.  PCP: Burnard Bunting, MD ? ?Subjective:  ? ?PATIENT ID: Aimee Brown GENDER: female DOB: 05/26/1940, MRN: [id number] ? ?Chief Complaint  ?Patient presents with  ? Follow-up  ?  Follow up.   ? ? ?This is a 81 year old female past medical history of hyperlipidemia, hypertension former patient Dr. Lake Bells followed for bronchiectasis and COPD.  Pulmonary function test June 2019 revealed severe airflow obstruction, FEV1 1.0 L, 48% predicted, no significant bronchodilator response, RV/TLC ratio 163%, DLCO 54%.  Patient is a lifelong non-smoker.  She does have secondhand smoke exposure from a previous job.  Patient has been doing well from a respiratory standpoint. She does still get dyspneic on exertion going from the grocery store to the car.  She will have to find sometimes a place to sit and rest.  She has been using her Anoro Ellipta regularly.  She uses her nebulizer regularly.  She does need new tubing and accessories for her nebulizer machine.  She has a crack in the cup and it leaks currently.  Patient does not have any significant sputum production, fevers chills night sweats weight loss.  She uses her flutter valve to help clear her airway.  She feels that her postural drainage as well as a flutter valve is working well for her at this time. ? ?OV 03/14/2020: Patient here today for follow-up regarding bronchiectasis, COPD.  She has severe airflow obstruction on prior pulmonary function tests.  She does have dyspnea on exertion currently managed with LABA/LAMA.  She uses her albuterol as needed.  She does state that she is having increased dyspnea on exertion as well as nocturnal coughing.  She does have sputum production.  She does seem to have progression of symptoms as the last time we have met.  We discussed this today in detail denies hemoptysis. ? ?OV 06/20/2020: Here today for follow-up regarding bronchiectasis and COPD.   Currently managed on a LABA/LAMA.  Her p.m. pulmonary function tests do show severe airflow obstruction.  She is doing well with her current inhaler regimen.  She needs refills of these medications today Anoro plus albuterol.  Currently using a nebulizer twice a day.  Trying to get out and walk more.  She completed 3 months of azithromycin Monday Wednesday Friday did not see much difference in her symptoms and she had associated diarrhea so medication was stopped. ? ?OV 07/03/2021: Here today for follow-up regarding bronchiectasis and COPD.  Currently managed with a LAMA/LABA.  Prior pulmonary function tests with significant airflow obstruction.  Doing really well on her current regimen she has not had an exacerbation of her lung disease in the past year.  She was on azithromycin and about a year ago for 3 months saw no difference and stopped she had some diarrhea that was associated with it.  She still uses her nebulizer twice a day rarely uses her short acting inhaler.  Uses her Breo daily.  She does have daily chronic thin clear sputum production. ? ? ?Past Medical History:  ?Diagnosis Date  ? Depression   ? Hypercholesteremia   ? Hypertension   ? Long-term use of high-risk medication 03/07/2020  ?  ? ?Family History  ?Problem Relation Age of Onset  ? Breast cancer Paternal Aunt   ? Breast cancer Maternal Grandmother   ? Heart disease Mother   ? Heart disease Father   ? Scleroderma Brother   ?  ? ?Past Surgical History:  ?  Procedure Laterality Date  ? ABDOMINAL HYSTERECTOMY    ? CERVICAL LAMINECTOMY    ? CESAREAN SECTION    ? KIDNEY STONE SURGERY    ? LEFT HEART CATH AND CORONARY ANGIOGRAPHY N/A 07/10/2017  ? Procedure: LEFT HEART CATH AND CORONARY ANGIOGRAPHY;  Surgeon: Leonie Man, MD;  Location: [facility];  Service: Cardiovascular;  Laterality: N/A;  ? SALPINGOOPHORECTOMY    ? TONSILLECTOMY    ? ? ?Social History  ? ?Socioeconomic History  ? Marital status: Married  ?  Spouse name: Not on file  ?  Number of children: Not on file  ? Years of education: Not on file  ? Highest education level: Not on file  ?Occupational History  ? Not on file  ?Tobacco Use  ? Smoking status: Never  ? Smokeless tobacco: Never  ?Substance and Sexual Activity  ? Alcohol use: No  ? Drug use: Never  ? Sexual activity: Not on file  ?Other Topics Concern  ? Not on file  ?Social History Narrative  ? Not on file  ? ?Social Determinants of Health  ? ?Financial Resource Strain: Not on file  ?Food Insecurity: Not on file  ?Transportation Needs: Not on file  ?Physical Activity: Not on file  ?Stress: Not on file  ?Social Connections: Not on file  ?Intimate Partner Violence: Not on file  ?  ? ?No Known Allergies  ? ?Outpatient Medications Prior to Visit  ?Medication Sig Dispense Refill  ? albuterol (PROVENTIL) (2.5 MG/3ML) 0.083% nebulizer solution Take 3 mLs (2.5 mg total) by nebulization every 6 (six) hours as needed for wheezing or shortness of breath. 600 mL 3  ? albuterol (VENTOLIN HFA) 108 (90 Base) MCG/ACT inhaler Inhale 2 puffs into the lungs every 6 (six) hours as needed for wheezing or shortness of breath. 1 each 6  ? Cholecalciferol (VITAMIN D PO) Take 1 tablet by mouth daily.    ? hydroxychloroquine (PLAQUENIL) 200 MG tablet Take by mouth.    ? levothyroxine (SYNTHROID, LEVOTHROID) 112 MCG tablet Take 137 mcg by mouth daily.    ? omeprazole (PRILOSEC) 20 MG capsule Take 20 mg by mouth daily.  1  ? ramipril (ALTACE) 10 MG capsule Take 10 mg by mouth daily.  3  ? sertraline (ZOLOFT) 50 MG tablet Take 50 mg by mouth daily.    ? umeclidinium-vilanterol (ANORO ELLIPTA) 62.5-25 MCG/INH AEPB Inhale 1 puff into the lungs daily. 7 each 0  ? ANORO ELLIPTA 62.5-25 MCG/INH AEPB Inhale 1 puff into the lungs daily. 30 each 11  ? ?No facility-administered medications prior to visit.  ? ? ?Review of Systems  ?Constitutional:  Negative for chills, fever, malaise/fatigue and weight loss.  ?HENT:  Negative for hearing loss, sore throat and tinnitus.    ?Eyes:  Negative for blurred vision and double vision.  ?Respiratory:  Positive for cough and shortness of breath. Negative for hemoptysis, sputum production, wheezing and stridor.   ?Cardiovascular:  Negative for chest pain, palpitations, orthopnea, leg swelling and PND.  ?Gastrointestinal:  Negative for abdominal pain, constipation, diarrhea, heartburn, nausea and vomiting.  ?Genitourinary:  Negative for dysuria, hematuria and urgency.  ?Musculoskeletal:  Negative for joint pain and myalgias.  ?Skin:  Negative for itching and rash.  ?Neurological:  Negative for dizziness, tingling, weakness and headaches.  ?Endo/Heme/Allergies:  Negative for environmental allergies. Does not bruise/bleed easily.  ?Psychiatric/Behavioral:  Negative for depression. The patient is not nervous/anxious and does not have insomnia.   ?All other systems reviewed and  are negative. ? ? ?Objective:  ?Physical Exam ?Vitals reviewed.  ?Constitutional:   ?   General: She is not in acute distress. ?   Appearance: She is well-developed.  ?HENT:  ?   Head: Normocephalic and atraumatic.  ?Eyes:  ?   General: No scleral icterus. ?   Conjunctiva/sclera: Conjunctivae normal.  ?   Pupils: Pupils are equal, round, and reactive to light.  ?Neck:  ?   Vascular: No JVD.  ?   Trachea: No tracheal deviation.  ?Cardiovascular:  ?   Rate and Rhythm: Normal rate and regular rhythm.  ?   Heart sounds: Normal heart sounds. No murmur heard. ?Pulmonary:  ?   Effort: Pulmonary effort is normal. No tachypnea, accessory muscle usage or respiratory distress.  ?   Breath sounds: No stridor. No wheezing, rhonchi or rales.  ?   Comments: Bilateral tubular breath sounds ?Abdominal:  ?   General: Bowel sounds are normal. There is no distension.  ?   Palpations: Abdomen is soft.  ?   Tenderness: There is no abdominal tenderness.  ?Musculoskeletal:     ?   General: No tenderness.  ?   Cervical back: Neck supple.  ?Lymphadenopathy:  ?   Cervical: No cervical adenopathy.   ?Skin: ?   General: Skin is warm and dry.  ?   Capillary Refill: Capillary refill takes less than 2 seconds.  ?   Findings: No rash.  ?Neurological:  ?   Mental Status: She is alert and oriented to person,

## 2021-08-16 DIAGNOSIS — R5383 Other fatigue: Secondary | ICD-10-CM | POA: Diagnosis not present

## 2021-08-16 DIAGNOSIS — I10 Essential (primary) hypertension: Secondary | ICD-10-CM | POA: Diagnosis not present

## 2021-08-16 DIAGNOSIS — M109 Gout, unspecified: Secondary | ICD-10-CM | POA: Diagnosis not present

## 2021-08-16 DIAGNOSIS — Z0000 Encounter for general adult medical examination without abnormal findings: Secondary | ICD-10-CM | POA: Diagnosis not present

## 2021-08-16 DIAGNOSIS — E039 Hypothyroidism, unspecified: Secondary | ICD-10-CM | POA: Diagnosis not present

## 2021-08-16 DIAGNOSIS — E785 Hyperlipidemia, unspecified: Secondary | ICD-10-CM | POA: Diagnosis not present

## 2021-08-21 DIAGNOSIS — I10 Essential (primary) hypertension: Secondary | ICD-10-CM | POA: Diagnosis not present

## 2021-08-21 DIAGNOSIS — E785 Hyperlipidemia, unspecified: Secondary | ICD-10-CM | POA: Diagnosis not present

## 2021-08-21 DIAGNOSIS — K219 Gastro-esophageal reflux disease without esophagitis: Secondary | ICD-10-CM | POA: Diagnosis not present

## 2021-08-21 DIAGNOSIS — M3500 Sicca syndrome, unspecified: Secondary | ICD-10-CM | POA: Diagnosis not present

## 2021-08-21 DIAGNOSIS — Z0000 Encounter for general adult medical examination without abnormal findings: Secondary | ICD-10-CM | POA: Diagnosis not present

## 2021-08-21 DIAGNOSIS — M1990 Unspecified osteoarthritis, unspecified site: Secondary | ICD-10-CM | POA: Diagnosis not present

## 2021-08-21 DIAGNOSIS — E663 Overweight: Secondary | ICD-10-CM | POA: Diagnosis not present

## 2021-08-21 DIAGNOSIS — Z1331 Encounter for screening for depression: Secondary | ICD-10-CM | POA: Diagnosis not present

## 2021-08-21 DIAGNOSIS — E039 Hypothyroidism, unspecified: Secondary | ICD-10-CM | POA: Diagnosis not present

## 2021-08-21 DIAGNOSIS — M109 Gout, unspecified: Secondary | ICD-10-CM | POA: Diagnosis not present

## 2021-08-21 DIAGNOSIS — J449 Chronic obstructive pulmonary disease, unspecified: Secondary | ICD-10-CM | POA: Diagnosis not present

## 2021-08-21 DIAGNOSIS — I700 Atherosclerosis of aorta: Secondary | ICD-10-CM | POA: Diagnosis not present

## 2021-08-21 DIAGNOSIS — M5412 Radiculopathy, cervical region: Secondary | ICD-10-CM | POA: Diagnosis not present

## 2021-08-21 DIAGNOSIS — Z1339 Encounter for screening examination for other mental health and behavioral disorders: Secondary | ICD-10-CM | POA: Diagnosis not present

## 2021-08-22 DIAGNOSIS — R82998 Other abnormal findings in urine: Secondary | ICD-10-CM | POA: Diagnosis not present

## 2021-09-06 ENCOUNTER — Encounter (INDEPENDENT_AMBULATORY_CARE_PROVIDER_SITE_OTHER): Payer: Self-pay | Admitting: Ophthalmology

## 2021-09-06 ENCOUNTER — Ambulatory Visit (INDEPENDENT_AMBULATORY_CARE_PROVIDER_SITE_OTHER): Payer: PPO | Admitting: Ophthalmology

## 2021-09-06 DIAGNOSIS — Z79899 Other long term (current) drug therapy: Secondary | ICD-10-CM

## 2021-09-06 DIAGNOSIS — H179 Unspecified corneal scar and opacity: Secondary | ICD-10-CM | POA: Diagnosis not present

## 2021-09-06 DIAGNOSIS — H43813 Vitreous degeneration, bilateral: Secondary | ICD-10-CM

## 2021-09-06 NOTE — Assessment & Plan Note (Addendum)
Patient recent evaluation at [facility] and was told nothing about a scar on the surface of the of the globe or cornea

## 2021-09-06 NOTE — Assessment & Plan Note (Signed)

## 2021-09-06 NOTE — Progress Notes (Signed)
09/06/2021     CHIEF COMPLAINT Patient presents for  Chief Complaint  Patient presents with   Retina Follow Up      HISTORY OF PRESENT ILLNESS: Aimee Brown is a 81 y.o. female who presents to the clinic today for:   HPI     Retina Follow Up           Diagnosis: Long Term use of high risk medication   Onset: 9 months ago         Comments   9 mos fu OU OCT FP. Patient reports "my right eye is bothering me, it feels like something is in it and it has a burning sensation sometimes. It is kinda of like I am looking through a haze. Dr. Gershon Crane said he doesn't see anything wrong with my eye."  Patient reports she uses Systane PRN OU. Patient uses '200mg'$  plaquenil twice daily.      Last edited by Laurin Coder on 09/06/2021 12:59 PM.      Referring physician: Burnard Bunting, MD [address] [place],  [place] [address]  HISTORICAL INFORMATION:   Selected notes from the [facility]: No current outpatient medications on file. (Ophthalmic Drugs)   No current facility-administered medications for this visit. (Ophthalmic Drugs)   Current Outpatient Medications (Other)  Medication Sig   albuterol (PROVENTIL) (2.5 MG/3ML) 0.083% nebulizer solution Take 3 mLs (2.5 mg total) by nebulization every 6 (six) hours as needed for wheezing or shortness of breath.   albuterol (VENTOLIN HFA) 108 (90 Base) MCG/ACT inhaler Inhale 2 puffs into the lungs every 6 (six) hours as needed for wheezing or shortness of breath.   Cholecalciferol (VITAMIN D PO) Take 1 tablet by mouth daily.   hydroxychloroquine (PLAQUENIL) 200 MG tablet Take by mouth.   levothyroxine (SYNTHROID, LEVOTHROID) 112 MCG tablet Take 137 mcg by mouth daily.   omeprazole (PRILOSEC) 20 MG capsule Take 20 mg by mouth daily.   ramipril (ALTACE) 10 MG capsule Take 10 mg by mouth daily.   sertraline (ZOLOFT) 50 MG tablet Take 50 mg by mouth daily.   umeclidinium-vilanterol (ANORO  ELLIPTA) 62.5-25 MCG/INH AEPB Inhale 1 puff into the lungs daily.   No current facility-administered medications for this visit. (Other)      REVIEW OF SYSTEMS:    ALLERGIES No Known Allergies  PAST MEDICAL HISTORY Past Medical History:  Diagnosis Date   Depression    Hypercholesteremia    Hypertension    Long-term use of high-risk medication 03/07/2020   Past Surgical History:  Procedure Laterality Date   ABDOMINAL HYSTERECTOMY     CERVICAL LAMINECTOMY     CESAREAN SECTION     KIDNEY STONE SURGERY     LEFT HEART CATH AND CORONARY ANGIOGRAPHY N/A 07/10/2017   Procedure: LEFT HEART CATH AND CORONARY ANGIOGRAPHY;  Surgeon: Leonie Man, MD;  Location: [facility];  Service: Cardiovascular;  Laterality: N/A;   SALPINGOOPHORECTOMY     TONSILLECTOMY      FAMILY HISTORY Family History  Problem Relation Age of Onset   Breast cancer Paternal Aunt    Breast cancer Maternal Grandmother    Heart disease Mother    Heart disease Father    Scleroderma Brother     SOCIAL HISTORY Social History   Tobacco Use   Smoking status: Never   Smokeless tobacco: Never  Substance Use Topics   Alcohol use: No   Drug use: Never  OPHTHALMIC EXAM:  Base Eye Exam     Visual Acuity (ETDRS)       Right Left   Dist cc 20/25 20/25 -1    Correction: Glasses         Tonometry (Tonopen, 1:00 PM)       Right Left   Pressure 15 15         Pupils       Pupils Dark Light APD   Right PERRL 4 3 None   Left PERRL 4 3 None         Visual Fields (Counting fingers)       Left Right    Full Full         Extraocular Movement       Right Left    Full Full         Neuro/Psych     Oriented x3: Yes   Mood/Affect: Normal         Dilation     Both eyes: 1.0% Mydriacyl, 2.5% Phenylephrine @ 1:00 PM           Slit Lamp and Fundus Exam     External Exam       Right Left   External Normal Normal         Slit Lamp Exam        Right Left   Lids/Lashes Normal Normal   Conjunctiva/Sclera White and quiet White and quiet   Cornea Corneal stromal opacity at an inferior to the visual axis, with thinning yet no epithelial defect no obvious infiltrates Clear   Anterior Chamber Deep and quiet Deep and quiet   Iris Round and reactive Round and reactive   Lens Centered posterior chamber intraocular lens, Open posterior capsule Centered posterior chamber intraocular lens, Open posterior capsule   Anterior Vitreous Normal Normal         Fundus Exam       Right Left   Posterior Vitreous Posterior vitreous detachment Posterior vitreous detachment   Disc Normal White fibrous nonproliferative tissue over nerve head   C/D Ratio 0.05 0.0   Macula Normal, no pigmentary changes Normal, no pigmentary changes   Vessels Normal Normal   Periphery Normal Normal            IMAGING AND PROCEDURES  Imaging and Procedures for 09/06/21  OCT, Retina - OU - Both Eyes       Right Eye Central Foveal Thickness: 307. Progression has been stable. Findings include normal foveal contour.   Left Eye Central Foveal Thickness: 309. Progression has been stable. Findings include normal foveal contour.   Notes Normal foveal contour, no signs of inner retina or outer retinal pigment epithelial atrophy.      Color Fundus Photography Optos - OU - Both Eyes       Right Eye Progression has no prior data. Disc findings include normal observations. Macula : normal observations. Vessels : normal observations. Periphery : normal observations.   Left Eye Progression has no prior data. Disc findings include normal observations. Macula : normal observations. Vessels : normal observations. Periphery : normal observations.   Notes OD, superonasal nerve opacity of the cornea imparts a peculiar appearance of the retina clinical exam correlates no retinal hole tear or detachment  PVD OD and OS              ASSESSMENT/PLAN:  Corneal  scar, right eye Patient recent evaluation at [facility] and was told nothing about a scar  on the surface of the of the globe or cornea  Posterior vitreous detachment of both eyes  The nature of posterior vitreous detachment was discussed with the patient as well as its physiology, its age prevalence, and its possible implication regarding retinal breaks and detachment.  An informational brochure was offered to the patient.  All the patient's questions were answered.  The patient was asked to return if new or different flashes or floaters develops.   Patient was instructed to contact office immediately if any new changes were noticed. I explained to the patient that vitreous inside the eye is similar to jello inside a bowl. As the jello melts it can start to pull away from the bowl, similarly the vitreous throughout our lives can begin to pull away from the retina. That process is called a posterior vitreous detachment. In some cases, the vitreous can tug hard enough on the retina to form a retinal tear. I discussed with the patient the signs and symptoms of a retinal detachment.  Do not rub the eye.       ICD-10-CM   1. Long-term use of high-risk medication  Z79.899 OCT, Retina - OU - Both Eyes    Color Fundus Photography Optos - OU - Both Eyes    2. Corneal scar, right eye  H17.9     3. Posterior vitreous detachment of both eyes  H43.813       1.  PVD OU.  No holes or tears.  2.  Old pigmentary change in the posterior retina, not pathologic and will observe OD  3.  Corneal scar nasal to visual axis with no active infiltrates no epithelial defect yet patient needs an answer as to what this is and if any therapy is appropriate  Ophthalmic Meds Ordered this visit:  No orders of the defined types were placed in this encounter.      Return for Schedule cornea consultation Dr. Annia Belt, and 9 months here, DILATE OU, COLOR FP, OCT.  There are no Patient Instructions on file  for this visit.   Explained the diagnoses, plan, and follow up with the patient and they expressed understanding.  Patient expressed understanding of the importance of proper follow up care.   Clent Demark Manolo Bosket M.D. Diseases & Surgery of the Retina and Vitreous Retina [facility] Diabetic [place] 09/06/21     Abbreviations: M myopia (nearsighted); A astigmatism; H hyperopia (farsighted); P presbyopia; Mrx spectacle prescription;  CTL contact lenses; OD right eye; OS left eye; OU both eyes  XT exotropia; ET esotropia; PEK punctate epithelial keratitis; PEE punctate epithelial erosions; DES dry eye syndrome; MGD meibomian gland dysfunction; ATs artificial tears; PFAT's preservative free artificial tears; [place] nuclear sclerotic cataract; PSC posterior subcapsular cataract; ERM epi-retinal membrane; PVD posterior vitreous detachment; RD retinal detachment; DM diabetes mellitus; DR diabetic retinopathy; NPDR non-proliferative diabetic retinopathy; PDR proliferative diabetic retinopathy; CSME clinically significant macular edema; DME diabetic macular edema; dbh dot blot hemorrhages; CWS cotton wool spot; POAG primary open angle glaucoma; C/D cup-to-disc ratio; HVF humphrey visual field; GVF goldmann visual field; OCT optical coherence tomography; IOP intraocular pressure; BRVO Branch retinal vein occlusion; CRVO central retinal vein occlusion; CRAO central retinal artery occlusion; BRAO branch retinal artery occlusion; RT retinal tear; SB scleral buckle; PPV pars plana vitrectomy; VH Vitreous hemorrhage; PRP panretinal laser photocoagulation; IVK intravitreal kenalog; VMT vitreomacular traction; MH Macular hole;  NVD neovascularization of the disc; NVE neovascularization elsewhere; AREDS age related eye disease study; ARMD age related macular degeneration;  POAG primary open angle glaucoma; EBMD epithelial/anterior basement membrane dystrophy; ACIOL anterior chamber intraocular lens; IOL intraocular lens; PCIOL  posterior chamber intraocular lens; Phaco/IOL phacoemulsification with intraocular lens placement; [place] photorefractive keratectomy; LASIK laser assisted in situ keratomileusis; HTN hypertension; DM diabetes mellitus; COPD chronic obstructive pulmonary disease

## 2021-10-02 ENCOUNTER — Other Ambulatory Visit: Payer: Self-pay | Admitting: *Deleted

## 2021-10-02 MED ORDER — ANORO ELLIPTA 62.5-25 MCG/ACT IN AEPB: 1.0000 | INHALATION_SPRAY | Freq: Every day | RESPIRATORY_TRACT | 5 refills | Status: DC

## 2021-10-25 DIAGNOSIS — D225 Melanocytic nevi of trunk: Secondary | ICD-10-CM | POA: Diagnosis not present

## 2021-10-25 DIAGNOSIS — L814 Other melanin hyperpigmentation: Secondary | ICD-10-CM | POA: Diagnosis not present

## 2021-10-25 DIAGNOSIS — Z08 Encounter for follow-up examination after completed treatment for malignant neoplasm: Secondary | ICD-10-CM | POA: Diagnosis not present

## 2021-10-25 DIAGNOSIS — L821 Other seborrheic keratosis: Secondary | ICD-10-CM | POA: Diagnosis not present

## 2021-10-25 DIAGNOSIS — Z85828 Personal history of other malignant neoplasm of skin: Secondary | ICD-10-CM | POA: Diagnosis not present

## 2021-10-25 DIAGNOSIS — L570 Actinic keratosis: Secondary | ICD-10-CM | POA: Diagnosis not present

## 2021-11-08 DIAGNOSIS — I7300 Raynaud's syndrome without gangrene: Secondary | ICD-10-CM | POA: Diagnosis not present

## 2021-11-08 DIAGNOSIS — E663 Overweight: Secondary | ICD-10-CM | POA: Diagnosis not present

## 2021-11-08 DIAGNOSIS — M3500 Sicca syndrome, unspecified: Secondary | ICD-10-CM | POA: Diagnosis not present

## 2021-11-08 DIAGNOSIS — R0609 Other forms of dyspnea: Secondary | ICD-10-CM | POA: Diagnosis not present

## 2021-11-08 DIAGNOSIS — Z6829 Body mass index (BMI) 29.0-29.9, adult: Secondary | ICD-10-CM | POA: Diagnosis not present

## 2021-11-08 DIAGNOSIS — M1991 Primary osteoarthritis, unspecified site: Secondary | ICD-10-CM | POA: Diagnosis not present

## 2021-12-23 DIAGNOSIS — Z23 Encounter for immunization: Secondary | ICD-10-CM | POA: Diagnosis not present

## 2021-12-28 DIAGNOSIS — Z961 Presence of intraocular lens: Secondary | ICD-10-CM | POA: Diagnosis not present

## 2021-12-28 DIAGNOSIS — H04123 Dry eye syndrome of bilateral lacrimal glands: Secondary | ICD-10-CM | POA: Diagnosis not present

## 2021-12-28 DIAGNOSIS — H0288A Meibomian gland dysfunction right eye, upper and lower eyelids: Secondary | ICD-10-CM | POA: Diagnosis not present

## 2021-12-28 DIAGNOSIS — H0288B Meibomian gland dysfunction left eye, upper and lower eyelids: Secondary | ICD-10-CM | POA: Diagnosis not present

## 2021-12-28 DIAGNOSIS — M3500 Sicca syndrome, unspecified: Secondary | ICD-10-CM | POA: Diagnosis not present

## 2021-12-28 DIAGNOSIS — H1711 Central corneal opacity, right eye: Secondary | ICD-10-CM | POA: Diagnosis not present

## 2022-02-13 ENCOUNTER — Other Ambulatory Visit: Payer: Self-pay | Admitting: Pulmonary Disease

## 2022-02-13 DIAGNOSIS — J449 Chronic obstructive pulmonary disease, unspecified: Secondary | ICD-10-CM

## 2022-02-13 DIAGNOSIS — J479 Bronchiectasis, uncomplicated: Secondary | ICD-10-CM

## 2022-02-28 DIAGNOSIS — M3500 Sicca syndrome, unspecified: Secondary | ICD-10-CM | POA: Diagnosis not present

## 2022-02-28 DIAGNOSIS — I10 Essential (primary) hypertension: Secondary | ICD-10-CM | POA: Diagnosis not present

## 2022-02-28 DIAGNOSIS — I700 Atherosclerosis of aorta: Secondary | ICD-10-CM | POA: Diagnosis not present

## 2022-02-28 DIAGNOSIS — E663 Overweight: Secondary | ICD-10-CM | POA: Diagnosis not present

## 2022-03-26 DIAGNOSIS — H04123 Dry eye syndrome of bilateral lacrimal glands: Secondary | ICD-10-CM | POA: Diagnosis not present

## 2022-03-26 DIAGNOSIS — H0288A Meibomian gland dysfunction right eye, upper and lower eyelids: Secondary | ICD-10-CM | POA: Diagnosis not present

## 2022-03-26 DIAGNOSIS — H1711 Central corneal opacity, right eye: Secondary | ICD-10-CM | POA: Diagnosis not present

## 2022-03-26 DIAGNOSIS — M3500 Sicca syndrome, unspecified: Secondary | ICD-10-CM | POA: Diagnosis not present

## 2022-03-26 DIAGNOSIS — H0288B Meibomian gland dysfunction left eye, upper and lower eyelids: Secondary | ICD-10-CM | POA: Diagnosis not present

## 2022-04-17 DIAGNOSIS — R599 Enlarged lymph nodes, unspecified: Secondary | ICD-10-CM | POA: Diagnosis not present

## 2022-04-17 DIAGNOSIS — I10 Essential (primary) hypertension: Secondary | ICD-10-CM | POA: Diagnosis not present

## 2022-04-17 DIAGNOSIS — M3500 Sicca syndrome, unspecified: Secondary | ICD-10-CM | POA: Diagnosis not present

## 2022-04-17 DIAGNOSIS — K1120 Sialoadenitis, unspecified: Secondary | ICD-10-CM | POA: Diagnosis not present

## 2022-04-26 ENCOUNTER — Telehealth: Payer: Self-pay | Admitting: Pulmonary Disease

## 2022-04-26 MED ORDER — ALBUTEROL SULFATE HFA 108 (90 BASE) MCG/ACT IN AERS: 2.0000 | INHALATION_SPRAY | Freq: Four times a day (QID) | RESPIRATORY_TRACT | 6 refills | Status: DC | PRN

## 2022-04-26 NOTE — Telephone Encounter (Signed)
Sent refill for albu. to [facility]. Called and left message for pt. Nothing further needed.

## 2022-04-30 DIAGNOSIS — L570 Actinic keratosis: Secondary | ICD-10-CM | POA: Diagnosis not present

## 2022-04-30 DIAGNOSIS — L578 Other skin changes due to chronic exposure to nonionizing radiation: Secondary | ICD-10-CM | POA: Diagnosis not present

## 2022-04-30 DIAGNOSIS — L718 Other rosacea: Secondary | ICD-10-CM | POA: Diagnosis not present

## 2022-04-30 DIAGNOSIS — L821 Other seborrheic keratosis: Secondary | ICD-10-CM | POA: Diagnosis not present

## 2022-05-14 ENCOUNTER — Other Ambulatory Visit: Payer: Self-pay | Admitting: Internal Medicine

## 2022-05-14 DIAGNOSIS — Z1231 Encounter for screening mammogram for malignant neoplasm of breast: Secondary | ICD-10-CM

## 2022-05-16 DIAGNOSIS — R0609 Other forms of dyspnea: Secondary | ICD-10-CM | POA: Diagnosis not present

## 2022-05-16 DIAGNOSIS — Z6829 Body mass index (BMI) 29.0-29.9, adult: Secondary | ICD-10-CM | POA: Diagnosis not present

## 2022-05-16 DIAGNOSIS — I7300 Raynaud's syndrome without gangrene: Secondary | ICD-10-CM | POA: Diagnosis not present

## 2022-05-16 DIAGNOSIS — M3501 Sicca syndrome with keratoconjunctivitis: Secondary | ICD-10-CM | POA: Diagnosis not present

## 2022-05-16 DIAGNOSIS — M1991 Primary osteoarthritis, unspecified site: Secondary | ICD-10-CM | POA: Diagnosis not present

## 2022-05-16 DIAGNOSIS — M3500 Sicca syndrome, unspecified: Secondary | ICD-10-CM | POA: Diagnosis not present

## 2022-05-16 DIAGNOSIS — E663 Overweight: Secondary | ICD-10-CM | POA: Diagnosis not present

## 2022-06-04 ENCOUNTER — Ambulatory Visit: Payer: PPO | Admitting: Pulmonary Disease

## 2022-06-04 ENCOUNTER — Encounter: Payer: Self-pay | Admitting: Pulmonary Disease

## 2022-06-04 VITALS — BP 90/70 | HR 76 | Ht 63.0 in | Wt 166.2 lb

## 2022-06-04 DIAGNOSIS — R053 Chronic cough: Secondary | ICD-10-CM | POA: Diagnosis not present

## 2022-06-04 DIAGNOSIS — J479 Bronchiectasis, uncomplicated: Secondary | ICD-10-CM

## 2022-06-04 NOTE — Progress Notes (Signed)
Synopsis: Referred in November 2020 former patient Dr. [place] Bells, bronchiectasis.  PCP: Burnard Bunting, MD  Subjective:   PATIENT ID: Aimee Brown GENDER: female DOB: [birth year], MRN: [id number]  Chief Complaint  Patient presents with   Follow-up    Yearly f/up    This is a 82 year old female past medical history of hyperlipidemia, hypertension former patient Dr. [place] Bells followed for bronchiectasis and COPD.  Pulmonary function test June 2019 revealed severe airflow obstruction, FEV1 1.0 L, 48% predicted, no significant bronchodilator response, RV/TLC ratio 163%, DLCO 54%.  Patient is a lifelong non-smoker.  She does have secondhand smoke exposure from a previous job.  Patient has been doing well from a respiratory standpoint. She does still get dyspneic on exertion going from the grocery store to the car.  She will have to find sometimes a place to sit and rest.  She has been using her Anoro Ellipta regularly.  She uses her nebulizer regularly.  She does need new tubing and accessories for her nebulizer machine.  She has a crack in the cup and it leaks currently.  Patient does not have any significant sputum production, fevers chills night sweats weight loss.  She uses her flutter valve to help clear her airway.  She feels that her postural drainage as well as a flutter valve is working well for her at this time.  OV 03/14/2020: Patient here today for follow-up regarding bronchiectasis, COPD.  She has severe airflow obstruction on prior pulmonary function tests.  She does have dyspnea on exertion currently managed with LABA/LAMA.  She uses her albuterol as needed.  She does state that she is having increased dyspnea on exertion as well as nocturnal coughing.  She does have sputum production.  She does seem to have progression of symptoms as the last time we have met.  We discussed this today in detail denies hemoptysis.  OV 06/20/2020: Here today for follow-up regarding bronchiectasis and COPD.   Currently managed on a LABA/LAMA.  Her p.m. pulmonary function tests do show severe airflow obstruction.  She is doing well with her current inhaler regimen.  She needs refills of these medications today Anoro plus albuterol.  Currently using a nebulizer twice a day.  Trying to get out and walk more.  She completed 3 months of azithromycin Monday Wednesday Friday did not see much difference in her symptoms and she had associated diarrhea so medication was stopped.  OV 07/03/2021: Here today for follow-up regarding bronchiectasis and COPD.  Currently managed with a LAMA/LABA.  Prior pulmonary function tests with significant airflow obstruction.  Doing really well on her current regimen she has not had an exacerbation of her lung disease in the past year.  She was on azithromycin and about a year ago for 3 months saw no difference and stopped she had some diarrhea that was associated with it.  She still uses her nebulizer twice a day rarely uses her short acting inhaler.  Uses her Breo daily.  She does have daily chronic thin clear sputum production.  OV 06/04/2022: Here today for follow-up for bronchiectasis and COPD.  She is doing really well currently managed on Anoro Ellipta.  She was on azithromycin for short period of time but developed diarrhea and stopped.  Continue use of albuterol nebulizer treatments at least twice a day.  She still has daily chronic thin clear sputum production.  But overall has been able to manage well.    Past Medical History:  Diagnosis Date   Depression  Hypercholesteremia    Hypertension    Long-term use of high-risk medication 03/07/2020     Family History  Problem Relation Age of Onset   Breast cancer Paternal Aunt    Breast cancer Maternal Grandmother    Heart disease Mother    Heart disease Father    Scleroderma Brother      Past Surgical History:  Procedure Laterality Date   ABDOMINAL HYSTERECTOMY     CERVICAL LAMINECTOMY     CESAREAN SECTION      KIDNEY STONE SURGERY     LEFT HEART CATH AND CORONARY ANGIOGRAPHY N/A 07/10/2017   Procedure: LEFT HEART CATH AND CORONARY ANGIOGRAPHY;  Surgeon: Leonie Man, MD;  Location: [facility];  Service: Cardiovascular;  Laterality: N/A;   SALPINGOOPHORECTOMY     TONSILLECTOMY      Social History   Socioeconomic History   Marital status: Married    Spouse name: Not on file   Number of children: Not on file   Years of education: Not on file   Highest education level: Not on file  Occupational History   Not on file  Tobacco Use   Smoking status: Never   Smokeless tobacco: Never  Substance and Sexual Activity   Alcohol use: No   Drug use: Never   Sexual activity: Not on file  Other Topics Concern   Not on file  Social History Narrative   Not on file   Social Determinants of Health   Financial Resource Strain: Not on file  Food Insecurity: Not on file  Transportation Needs: Not on file  Physical Activity: Not on file  Stress: Not on file  Social Connections: Not on file  Intimate Partner Violence: Not on file     No Known Allergies   Outpatient Medications Prior to Visit  Medication Sig Dispense Refill   albuterol (PROVENTIL) (2.5 MG/3ML) 0.083% nebulizer solution USE 1 VIAL VIA NEBULIZER EVERY 6 HOURS AS NEEDED FOR WHEEZING OR SHORTNESS OF BREATH 540 mL 3   albuterol (VENTOLIN HFA) 108 (90 Base) MCG/ACT inhaler Inhale 2 puffs into the lungs every 6 (six) hours as needed for wheezing or shortness of breath. 1 each 6   Cholecalciferol (VITAMIN D PO) Take 1 tablet by mouth daily.     hydroxychloroquine (PLAQUENIL) 200 MG tablet Take by mouth.     levothyroxine (SYNTHROID, LEVOTHROID) 112 MCG tablet Take 137 mcg by mouth daily.     omeprazole (PRILOSEC) 20 MG capsule Take 20 mg by mouth daily.  1   ramipril (ALTACE) 10 MG capsule Take 10 mg by mouth daily.  3   sertraline (ZOLOFT) 50 MG tablet Take 50 mg by mouth daily.     umeclidinium-vilanterol (ANORO ELLIPTA)  62.5-25 MCG/ACT AEPB Inhale 1 puff into the lungs daily. 60 each 5   No facility-administered medications prior to visit.    Review of Systems  Constitutional:  Negative for chills, fever, malaise/fatigue and weight loss.  HENT:  Negative for hearing loss, sore throat and tinnitus.   Eyes:  Negative for blurred vision and double vision.  Respiratory:  Positive for cough and shortness of breath. Negative for hemoptysis, sputum production, wheezing and stridor.   Cardiovascular:  Negative for chest pain, palpitations, orthopnea, leg swelling and PND.  Gastrointestinal:  Negative for abdominal pain, constipation, diarrhea, heartburn, nausea and vomiting.  Genitourinary:  Negative for dysuria, hematuria and urgency.  Musculoskeletal:  Negative for joint pain and myalgias.  Skin:  Negative for itching and rash.  Neurological:  Negative for dizziness, tingling, weakness and headaches.  Endo/Heme/Allergies:  Negative for environmental allergies. Does not bruise/bleed easily.  Psychiatric/Behavioral:  Negative for depression. The patient is not nervous/anxious and does not have insomnia.   All other systems reviewed and are negative.    Objective:  Physical Exam Vitals reviewed.  Constitutional:      General: She is not in acute distress.    Appearance: She is well-developed.  HENT:     Head: Normocephalic and atraumatic.  Eyes:     General: No scleral icterus.    Pupils: Pupils are equal, round, and reactive to light.     Comments: Redness in bilateral eyes  Neck:     Vascular: No JVD.     Trachea: No tracheal deviation.  Cardiovascular:     Rate and Rhythm: Normal rate and regular rhythm.     Heart sounds: Normal heart sounds. No murmur heard. Pulmonary:     Effort: Pulmonary effort is normal. No tachypnea, accessory muscle usage or respiratory distress.     Breath sounds: No stridor. No wheezing, rhonchi or rales.  Abdominal:     General: There is no distension.      Palpations: Abdomen is soft.     Tenderness: There is no abdominal tenderness.  Musculoskeletal:        General: No tenderness.     Cervical back: Neck supple.  Lymphadenopathy:     Cervical: No cervical adenopathy.  Skin:    General: Skin is warm and dry.     Capillary Refill: Capillary refill takes less than 2 seconds.     Findings: No rash.  Neurological:     Mental Status: She is alert and oriented to person, place, and time.  Psychiatric:        Behavior: Behavior normal.      Vitals:   06/04/22 1138  BP: 90/70  Pulse: 76  SpO2: 96%  Weight: 166 lb 3.2 oz (75.4 kg)  Height: 5\' 3"  (1.6 m)   96% on RA BMI Readings from Last 3 Encounters:  06/04/22 29.44 kg/m  07/03/21 29.51 kg/m  06/20/20 30.11 kg/m   Wt Readings from Last 3 Encounters:  06/04/22 166 lb 3.2 oz (75.4 kg)  07/03/21 166 lb 9.6 oz (75.6 kg)  06/20/20 170 lb (77.1 kg)     CBC No results found for: "WBC", "RBC", "HGB", "HCT", "PLT", "MCV", "MCH", "MCHC", "RDW", "LYMPHSABS", "MONOABS", "EOSABS", "BASOSABS"  December 2021: Sputum cultures negative, AFB negative, fungus negative except for normal flora Candida  Chest Imaging:  July 2019: High-resolution CT scan of the chest. Bilateral bronchiectasis. The patient's images have been independently reviewed by me.    Pulmonary Functions Testing Results:    Latest Ref Rng & Units 08/19/2017    3:43 PM  PFT Results  FVC-Pre L 1.33   FVC-Predicted Pre % 48   FVC-Post L 1.45   FVC-Predicted Post % 52   Pre FEV1/FVC % % 72   Post FEV1/FCV % % 69   FEV1-Pre L 0.96   FEV1-Predicted Pre % 46   FEV1-Post L 1.00   DLCO uncorrected ml/min/mmHg 13.16   DLCO UNC% % 54   DLVA Predicted % 100   TLC L 5.47   TLC % Predicted % 108   RV % Predicted % 177     FeNO: None   Pathology: None   Echocardiogram: None   Heart Catheterization:  07/10/2017 left heart catheterization: The left ventricular systolic function is normal. EF  55-65% by visual  estimate. LV end diastolic pressure is moderately elevated. 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 otherwise essentially angiographically normal coronaries    Assessment & Plan:     ICD-10-CM   1. Bronchiectasis without complication ([place])  [id number]     2. Chronic cough  R05.3       Discussion:  This is a 82 year old female, history of bronchiectasis, prior CT imaging with cylindric bronchiectasis, Sjogren's syndrome on Plaquenil with dry eyes.  Also on Restasis recently started.  She has not had any significant exacerbations this past year.  She uses Anoro Ellipta daily.  As well as her albuterol nebulizers at least twice a day.  This to help maintain her airway clearance.  Plan: If she has frequent exacerbations would consider getting cultures again. At this time continue current bronchodilator regimen and pulmonary toileting regimen. Continue albuterol nebulizers at least twice a day. Encouraged her to remain physically active. Refills of meds for the next year. Return to clinic in 1 year or as needed.    Current Outpatient Medications:    albuterol (PROVENTIL) (2.5 MG/3ML) 0.083% nebulizer solution, USE 1 VIAL VIA NEBULIZER EVERY 6 HOURS AS NEEDED FOR WHEEZING OR SHORTNESS OF BREATH, Disp: 540 mL, Rfl: 3   albuterol (VENTOLIN HFA) 108 (90 Base) MCG/ACT inhaler, Inhale 2 puffs into the lungs every 6 (six) hours as needed for wheezing or shortness of breath., Disp: 1 each, Rfl: 6   Cholecalciferol (VITAMIN D PO), Take 1 tablet by mouth daily., Disp: , Rfl:    hydroxychloroquine (PLAQUENIL) 200 MG tablet, Take by mouth., Disp: , Rfl:    levothyroxine (SYNTHROID, LEVOTHROID) 112 MCG tablet, Take 137 mcg by mouth daily., Disp: , Rfl:    omeprazole (PRILOSEC) 20 MG capsule, Take 20 mg by mouth daily., Disp: , Rfl: 1   ramipril (ALTACE) 10 MG capsule, Take 10 mg by mouth daily., Disp: , Rfl: 3   sertraline (ZOLOFT) 50 MG tablet, Take 50 mg by mouth  daily., Disp: , Rfl:    umeclidinium-vilanterol (ANORO ELLIPTA) 62.5-25 MCG/ACT AEPB, Inhale 1 puff into the lungs daily., Disp: 60 each, Rfl: [place], DO [facility] 06/04/2022 11:57 AM

## 2022-06-04 NOTE — Patient Instructions (Addendum)
Thank you for visiting Dr. Valeta Harms at [facility]. Today we recommend the following:  Continue Anoro daily  Continue Albuterol nebs as needed thorughout the day  Return in about 1 year (around 06/04/2023) for with APP or Dr. Valeta Harms.    Please do your part to reduce the spread of COVID-19.

## 2022-06-06 ENCOUNTER — Encounter (INDEPENDENT_AMBULATORY_CARE_PROVIDER_SITE_OTHER): Payer: PPO | Admitting: Ophthalmology

## 2022-06-07 ENCOUNTER — Encounter (INDEPENDENT_AMBULATORY_CARE_PROVIDER_SITE_OTHER): Payer: PPO | Admitting: Ophthalmology

## 2022-06-07 DIAGNOSIS — H179 Unspecified corneal scar and opacity: Secondary | ICD-10-CM | POA: Diagnosis not present

## 2022-06-07 DIAGNOSIS — H04123 Dry eye syndrome of bilateral lacrimal glands: Secondary | ICD-10-CM | POA: Diagnosis not present

## 2022-06-07 DIAGNOSIS — H31091 Other chorioretinal scars, right eye: Secondary | ICD-10-CM | POA: Diagnosis not present

## 2022-06-07 DIAGNOSIS — H43813 Vitreous degeneration, bilateral: Secondary | ICD-10-CM | POA: Diagnosis not present

## 2022-06-07 DIAGNOSIS — Z79899 Other long term (current) drug therapy: Secondary | ICD-10-CM | POA: Diagnosis not present

## 2022-06-25 DIAGNOSIS — M3500 Sicca syndrome, unspecified: Secondary | ICD-10-CM | POA: Diagnosis not present

## 2022-06-25 DIAGNOSIS — H0288A Meibomian gland dysfunction right eye, upper and lower eyelids: Secondary | ICD-10-CM | POA: Diagnosis not present

## 2022-06-25 DIAGNOSIS — H1711 Central corneal opacity, right eye: Secondary | ICD-10-CM | POA: Diagnosis not present

## 2022-06-25 DIAGNOSIS — H0288B Meibomian gland dysfunction left eye, upper and lower eyelids: Secondary | ICD-10-CM | POA: Diagnosis not present

## 2022-06-25 DIAGNOSIS — H04123 Dry eye syndrome of bilateral lacrimal glands: Secondary | ICD-10-CM | POA: Diagnosis not present

## 2022-07-02 ENCOUNTER — Ambulatory Visit
Admission: RE | Admit: 2022-07-02 | Discharge: 2022-07-02 | Disposition: A | Discharge status: Home / Self Care | Payer: PPO | Source: Ambulatory Visit | Attending: Internal Medicine | Admitting: Internal Medicine

## 2022-07-02 DIAGNOSIS — Z1231 Encounter for screening mammogram for malignant neoplasm of breast: Secondary | ICD-10-CM | POA: Diagnosis not present

## 2022-07-04 ENCOUNTER — Other Ambulatory Visit: Payer: Self-pay | Admitting: Pulmonary Disease

## 2022-07-06 ENCOUNTER — Telehealth: Payer: Self-pay | Admitting: Pulmonary Disease

## 2022-07-06 MED ORDER — PREDNISONE 10 MG PO TABS: ORAL_TABLET | ORAL | 0 refills | Status: DC

## 2022-07-06 MED ORDER — DOXYCYCLINE HYCLATE 100 MG PO TABS: 100.0000 mg | ORAL_TABLET | Freq: Two times a day (BID) | ORAL | 0 refills | Status: DC

## 2022-07-06 NOTE — Telephone Encounter (Signed)
Please have her start doxycycline  bid x 7 days Start pred taper > Take  daily for 3 days, then  daily for 3 days, then  daily for 3 days, then  daily for 3 days, then stop  She will need to be seen in 10-14 days in ofice to insure she is improving

## 2022-07-06 NOTE — Telephone Encounter (Signed)
Spoke to pt and she states that she has a cough and wheezing with chest congestion but not able to get anything out. Currently using Alb neb BID and Anoro once daily and Alb. inhaler when needed. Also taking Robitussin (pt does not know how many MG) and tylenol. Would like something called in to pharmacy. I informed pt I will send a message to the provider of the day. Pt verbalized understanding.

## 2022-07-06 NOTE — Telephone Encounter (Signed)
Patient has a constant cough with wheezing and would like some medication called into her pharmacy.  Please advise.  CB# [phone]

## 2022-07-06 NOTE — Telephone Encounter (Signed)
Called and spoke with patient. She verbalized understanding. Medication has sent to pharmacy.   Nothing further needed at time of call.

## 2022-07-09 DIAGNOSIS — H179 Unspecified corneal scar and opacity: Secondary | ICD-10-CM | POA: Diagnosis not present

## 2022-07-09 DIAGNOSIS — Z961 Presence of intraocular lens: Secondary | ICD-10-CM | POA: Diagnosis not present

## 2022-07-09 DIAGNOSIS — H524 Presbyopia: Secondary | ICD-10-CM | POA: Diagnosis not present

## 2022-07-09 DIAGNOSIS — H52201 Unspecified astigmatism, right eye: Secondary | ICD-10-CM | POA: Diagnosis not present

## 2022-07-09 DIAGNOSIS — Z79899 Other long term (current) drug therapy: Secondary | ICD-10-CM | POA: Diagnosis not present

## 2022-08-27 DIAGNOSIS — E039 Hypothyroidism, unspecified: Secondary | ICD-10-CM | POA: Diagnosis not present

## 2022-08-27 DIAGNOSIS — K219 Gastro-esophageal reflux disease without esophagitis: Secondary | ICD-10-CM | POA: Diagnosis not present

## 2022-08-27 DIAGNOSIS — M109 Gout, unspecified: Secondary | ICD-10-CM | POA: Diagnosis not present

## 2022-08-27 DIAGNOSIS — I10 Essential (primary) hypertension: Secondary | ICD-10-CM | POA: Diagnosis not present

## 2022-08-27 DIAGNOSIS — R7989 Other specified abnormal findings of blood chemistry: Secondary | ICD-10-CM | POA: Diagnosis not present

## 2022-08-27 DIAGNOSIS — E785 Hyperlipidemia, unspecified: Secondary | ICD-10-CM | POA: Diagnosis not present

## 2022-09-03 DIAGNOSIS — M1990 Unspecified osteoarthritis, unspecified site: Secondary | ICD-10-CM | POA: Diagnosis not present

## 2022-09-03 DIAGNOSIS — J449 Chronic obstructive pulmonary disease, unspecified: Secondary | ICD-10-CM | POA: Diagnosis not present

## 2022-09-03 DIAGNOSIS — I10 Essential (primary) hypertension: Secondary | ICD-10-CM | POA: Diagnosis not present

## 2022-09-03 DIAGNOSIS — Z1331 Encounter for screening for depression: Secondary | ICD-10-CM | POA: Diagnosis not present

## 2022-09-03 DIAGNOSIS — Z0000 Encounter for general adult medical examination without abnormal findings: Secondary | ICD-10-CM | POA: Diagnosis not present

## 2022-09-03 DIAGNOSIS — Z1339 Encounter for screening examination for other mental health and behavioral disorders: Secondary | ICD-10-CM | POA: Diagnosis not present

## 2022-09-03 DIAGNOSIS — E785 Hyperlipidemia, unspecified: Secondary | ICD-10-CM | POA: Diagnosis not present

## 2022-09-03 DIAGNOSIS — Z23 Encounter for immunization: Secondary | ICD-10-CM | POA: Diagnosis not present

## 2022-09-03 DIAGNOSIS — R82998 Other abnormal findings in urine: Secondary | ICD-10-CM | POA: Diagnosis not present

## 2022-09-18 DIAGNOSIS — L0101 Non-bullous impetigo: Secondary | ICD-10-CM | POA: Diagnosis not present

## 2022-10-01 ENCOUNTER — Other Ambulatory Visit: Payer: Self-pay | Admitting: Pulmonary Disease

## 2022-10-29 DIAGNOSIS — M3500 Sicca syndrome, unspecified: Secondary | ICD-10-CM | POA: Diagnosis not present

## 2022-10-29 DIAGNOSIS — H0288B Meibomian gland dysfunction left eye, upper and lower eyelids: Secondary | ICD-10-CM | POA: Diagnosis not present

## 2022-10-29 DIAGNOSIS — H0288A Meibomian gland dysfunction right eye, upper and lower eyelids: Secondary | ICD-10-CM | POA: Diagnosis not present

## 2022-10-29 DIAGNOSIS — H04123 Dry eye syndrome of bilateral lacrimal glands: Secondary | ICD-10-CM | POA: Diagnosis not present

## 2022-10-29 DIAGNOSIS — Z961 Presence of intraocular lens: Secondary | ICD-10-CM | POA: Diagnosis not present

## 2022-10-29 DIAGNOSIS — H1711 Central corneal opacity, right eye: Secondary | ICD-10-CM | POA: Diagnosis not present

## 2022-10-29 DIAGNOSIS — Z79899 Other long term (current) drug therapy: Secondary | ICD-10-CM | POA: Diagnosis not present

## 2022-10-31 DIAGNOSIS — D225 Melanocytic nevi of trunk: Secondary | ICD-10-CM | POA: Diagnosis not present

## 2022-10-31 DIAGNOSIS — C44729 Squamous cell carcinoma of skin of left lower limb, including hip: Secondary | ICD-10-CM | POA: Diagnosis not present

## 2022-10-31 DIAGNOSIS — Z08 Encounter for follow-up examination after completed treatment for malignant neoplasm: Secondary | ICD-10-CM | POA: Diagnosis not present

## 2022-10-31 DIAGNOSIS — Z85828 Personal history of other malignant neoplasm of skin: Secondary | ICD-10-CM | POA: Diagnosis not present

## 2022-10-31 DIAGNOSIS — L821 Other seborrheic keratosis: Secondary | ICD-10-CM | POA: Diagnosis not present

## 2022-10-31 DIAGNOSIS — L814 Other melanin hyperpigmentation: Secondary | ICD-10-CM | POA: Diagnosis not present

## 2022-10-31 DIAGNOSIS — L570 Actinic keratosis: Secondary | ICD-10-CM | POA: Diagnosis not present

## 2022-10-31 DIAGNOSIS — D492 Neoplasm of unspecified behavior of bone, soft tissue, and skin: Secondary | ICD-10-CM | POA: Diagnosis not present

## 2022-11-14 DIAGNOSIS — M1991 Primary osteoarthritis, unspecified site: Secondary | ICD-10-CM | POA: Diagnosis not present

## 2022-11-14 DIAGNOSIS — R0609 Other forms of dyspnea: Secondary | ICD-10-CM | POA: Diagnosis not present

## 2022-11-14 DIAGNOSIS — M3501 Sicca syndrome with keratoconjunctivitis: Secondary | ICD-10-CM | POA: Diagnosis not present

## 2022-11-14 DIAGNOSIS — I7300 Raynaud's syndrome without gangrene: Secondary | ICD-10-CM | POA: Diagnosis not present

## 2022-11-14 DIAGNOSIS — Z6829 Body mass index (BMI) 29.0-29.9, adult: Secondary | ICD-10-CM | POA: Diagnosis not present

## 2022-11-14 DIAGNOSIS — E663 Overweight: Secondary | ICD-10-CM | POA: Diagnosis not present

## 2022-11-21 DIAGNOSIS — C44729 Squamous cell carcinoma of skin of left lower limb, including hip: Secondary | ICD-10-CM | POA: Diagnosis not present

## 2022-11-21 DIAGNOSIS — I872 Venous insufficiency (chronic) (peripheral): Secondary | ICD-10-CM | POA: Diagnosis not present

## 2022-12-04 DIAGNOSIS — H43813 Vitreous degeneration, bilateral: Secondary | ICD-10-CM | POA: Diagnosis not present

## 2022-12-04 DIAGNOSIS — H31091 Other chorioretinal scars, right eye: Secondary | ICD-10-CM | POA: Diagnosis not present

## 2022-12-04 DIAGNOSIS — H179 Unspecified corneal scar and opacity: Secondary | ICD-10-CM | POA: Diagnosis not present

## 2022-12-04 DIAGNOSIS — H04123 Dry eye syndrome of bilateral lacrimal glands: Secondary | ICD-10-CM | POA: Diagnosis not present

## 2022-12-04 DIAGNOSIS — Z79899 Other long term (current) drug therapy: Secondary | ICD-10-CM | POA: Diagnosis not present

## 2022-12-29 DIAGNOSIS — Z23 Encounter for immunization: Secondary | ICD-10-CM | POA: Diagnosis not present

## 2022-12-31 ENCOUNTER — Other Ambulatory Visit: Payer: Self-pay | Admitting: Pulmonary Disease

## 2022-12-31 DIAGNOSIS — J479 Bronchiectasis, uncomplicated: Secondary | ICD-10-CM

## 2022-12-31 DIAGNOSIS — J449 Chronic obstructive pulmonary disease, unspecified: Secondary | ICD-10-CM

## 2023-01-28 DIAGNOSIS — H1711 Central corneal opacity, right eye: Secondary | ICD-10-CM | POA: Diagnosis not present

## 2023-01-28 DIAGNOSIS — M3500 Sicca syndrome, unspecified: Secondary | ICD-10-CM | POA: Diagnosis not present

## 2023-01-28 DIAGNOSIS — Z961 Presence of intraocular lens: Secondary | ICD-10-CM | POA: Diagnosis not present

## 2023-01-28 DIAGNOSIS — H04123 Dry eye syndrome of bilateral lacrimal glands: Secondary | ICD-10-CM | POA: Diagnosis not present

## 2023-01-28 DIAGNOSIS — H0288A Meibomian gland dysfunction right eye, upper and lower eyelids: Secondary | ICD-10-CM | POA: Diagnosis not present

## 2023-01-28 DIAGNOSIS — Z79899 Other long term (current) drug therapy: Secondary | ICD-10-CM | POA: Diagnosis not present

## 2023-01-28 DIAGNOSIS — H0288B Meibomian gland dysfunction left eye, upper and lower eyelids: Secondary | ICD-10-CM | POA: Diagnosis not present

## 2023-02-11 ENCOUNTER — Ambulatory Visit: Payer: PPO | Admitting: Internal Medicine

## 2023-02-11 ENCOUNTER — Encounter: Payer: Self-pay | Admitting: Internal Medicine

## 2023-02-11 VITALS — BP 138/68 | HR 108 | Temp 99.6°F | Ht 63.0 in | Wt 166.4 lb

## 2023-02-11 DIAGNOSIS — J471 Bronchiectasis with (acute) exacerbation: Secondary | ICD-10-CM | POA: Diagnosis not present

## 2023-02-11 LAB — CBC WITH DIFFERENTIAL/PLATELET
Basophils Absolute: 0.1 10*3/uL (ref 0.0–0.1)
Basophils Relative: 0.5 % (ref 0.0–3.0)
Eosinophils Absolute: 0 10*3/uL (ref 0.0–0.7)
Eosinophils Relative: 0.2 % (ref 0.0–5.0)
HCT: 38.7 % (ref 36.0–46.0)
Hemoglobin: 12.8 g/dL (ref 12.0–15.0)
Lymphocytes Relative: 4.5 % — ABNORMAL LOW (ref 12.0–46.0)
Lymphs Abs: 0.6 10*3/uL — ABNORMAL LOW (ref 0.7–4.0)
MCHC: 33 g/dL (ref 30.0–36.0)
MCV: 91 fL (ref 78.0–100.0)
Monocytes Absolute: 1.4 10*3/uL — ABNORMAL HIGH (ref 0.1–1.0)
Monocytes Relative: 11.2 % (ref 3.0–12.0)
Neutro Abs: 10.2 10*3/uL — ABNORMAL HIGH (ref 1.4–7.7)
Neutrophils Relative %: 83.6 % — ABNORMAL HIGH (ref 43.0–77.0)
Platelets: 158 10*3/uL (ref 150.0–400.0)
RBC: 4.25 Mil/uL (ref 3.87–5.11)
RDW: 14.3 % (ref 11.5–15.5)
WBC: 12.2 10*3/uL — ABNORMAL HIGH (ref 4.0–10.5)

## 2023-02-11 LAB — POCT INFLUENZA A/B
Influenza A, POC: NEGATIVE
Influenza B, POC: NEGATIVE

## 2023-02-11 LAB — POC COVID19 BINAXNOW: SARS Coronavirus 2 Ag: NEGATIVE

## 2023-02-11 MED ORDER — DOXYCYCLINE HYCLATE 100 MG PO TABS: 100.0000 mg | ORAL_TABLET | Freq: Two times a day (BID) | ORAL | 0 refills | Status: DC

## 2023-02-11 MED ORDER — PREDNISONE 10 MG PO TABS: ORAL_TABLET | ORAL | 0 refills | Status: DC

## 2023-02-11 NOTE — Progress Notes (Signed)
Synopsis: Referred in November 2020 former patient Dr. Kendrick Fries, bronchiectasis.  PCP: Geoffry Paradise, MD  Subjective:   PATIENT ID: Ned Clines GENDER: female DOB: 09/09/1940, MRN: [id number]  Chief Complaint  Patient presents with   Follow-up    Yearly f/up    This is a 82 year old female past medical history of hyperlipidemia, hypertension former patient Dr. Kendrick Fries followed for bronchiectasis and COPD.  Pulmonary function test June 2019 revealed severe airflow obstruction, FEV1 1.0 L, 48% predicted, no significant bronchodilator response, RV/TLC ratio 163%, DLCO 54%.  Patient is a lifelong non-smoker.  She does have secondhand smoke exposure from a previous job.  Patient has been doing well from a respiratory standpoint. She does still get dyspneic on exertion going from the grocery store to the car.  She will have to find sometimes a place to sit and rest.  She has been using her Anoro Ellipta regularly.  She uses her nebulizer regularly.  She does need new tubing and accessories for her nebulizer machine.  She has a crack in the cup and it leaks currently.  Patient does not have any significant sputum production, fevers chills night sweats weight loss.  She uses her flutter valve to help clear her airway.  She feels that her postural drainage as well as a flutter valve is working well for her at this time.  OV 03/14/2020: Patient here today for follow-up regarding bronchiectasis, COPD.  She has severe airflow obstruction on prior pulmonary function tests.  She does have dyspnea on exertion currently managed with LABA/LAMA.  She uses her albuterol as needed.  She does state that she is having increased dyspnea on exertion as well as nocturnal coughing.  She does have sputum production.  She does seem to have progression of symptoms as the last time we have met.  We discussed this today in detail denies hemoptysis.  OV 06/20/2020: Here today for follow-up regarding bronchiectasis and COPD.   Currently managed on a LABA/LAMA.  Her p.m. pulmonary function tests do show severe airflow obstruction.  She is doing well with her current inhaler regimen.  She needs refills of these medications today Anoro plus albuterol.  Currently using a nebulizer twice a day.  Trying to get out and walk more.  She completed 3 months of azithromycin Monday Wednesday Friday did not see much difference in her symptoms and she had associated diarrhea so medication was stopped.  OV 07/03/2021: Here today for follow-up regarding bronchiectasis and COPD.  Currently managed with a LAMA/LABA.  Prior pulmonary function tests with significant airflow obstruction.  Doing really well on her current regimen she has not had an exacerbation of her lung disease in the past year.  She was on azithromycin and about a year ago for 3 months saw no difference and stopped she had some diarrhea that was associated with it.  She still uses her nebulizer twice a day rarely uses her short acting inhaler.  Uses her Breo daily.  She does have daily chronic thin clear sputum production.  OV 06/04/2022: Here today for follow-up for bronchiectasis and COPD.  She is doing really well currently managed on Anoro Ellipta.  She was on azithromycin for short period of time but developed diarrhea and stopped.  Continue use of albuterol nebulizer treatments at least twice a day.  She still has daily chronic thin clear sputum production.  But overall has been able to manage well.    OV 02/11/2023  Subjective:  Patient ID: Carney Bern  Dimas Alexandria, female , DOB: 1941-01-21 , age 82 y.o. , MRN: [id number] , ADDRESS: [address] [place] [address] PCP Geoffry Paradise, MD Patient Care Team: Geoffry Paradise, MD as PCP - General (Internal Medicine)  This Provider for this visit: Treatment Team:  Attending Provider: Kalman Shan, MD    02/11/2023 -   Chief Complaint  Patient presents with   Acute Visit    Cough with green sputum x 5 days.       HPI SHAYLEN MOWRY 82 y.o. -acute visit for Dr. Elige Radon Icard/Dr. Levy Pupa patient with bronchiectasis.  Review of the records indicate last CT scan of the chest 2019 last pulmonary function test 2019.  Do not have any baseline allergy panel and CBC on the record.  She is here with her husband.  Husband is a slightly independent historian.  But both are from the same history.  She has had cough since 02/08/2023.  Rated as severe.  It is not getting better.  Then for the last 2 days she has had green sputum.  Baseline is clear sputum.  She is also more short of breath.  She believes she is running a low-grade fever for a day or 2.  Temperature here was 99.6.  Last antibiotic and prednisone was over a year ago according to her history.  No sick contacts currently.  No hemoptysis no edema.  2019 high-resolution CT chest personally visualized and agree with the findings.  There is lower lobe bronchiectasis.  Infectious crackles that which is consistent with that. PFT    Latest Ref Rng & Units 08/19/2017    3:43 PM  PFT Results  FVC-Pre L 1.33   FVC-Predicted Pre % 48   FVC-Post L 1.45   FVC-Predicted Post % 52   Pre FEV1/FVC % % 72   Post FEV1/FCV % % 69   FEV1-Pre L 0.96   FEV1-Predicted Pre % 46   FEV1-Post L 1.00   DLCO uncorrected ml/min/mmHg 13.16   DLCO UNC% % 54   DLVA Predicted % 100   TLC L 5.47   TLC % Predicted % 108   RV % Predicted % 177       LAB RESULTS last 96 hours No results found.  LAB RESULTS last 90 days No results found for this or any previous visit (from the past 2160 hour(s)).       has a past medical history of Depression, Hypercholesteremia, Hypertension, and Long-term use of high-risk medication (03/07/2020).   reports that she has never smoked. She has never used smokeless tobacco.  Past Surgical History:  Procedure Laterality Date   ABDOMINAL HYSTERECTOMY     CERVICAL LAMINECTOMY     CESAREAN SECTION     KIDNEY STONE SURGERY     LEFT  HEART CATH AND CORONARY ANGIOGRAPHY N/A 07/10/2017   Procedure: LEFT HEART CATH AND CORONARY ANGIOGRAPHY;  Surgeon: Marykay Lex, MD;  Location: [facility] INVASIVE CV LAB;  Service: Cardiovascular;  Laterality: N/A;   SALPINGOOPHORECTOMY     TONSILLECTOMY      No Known Allergies  Immunization History  Administered Date(s) Administered   Fluad Quad(high Dose 65+) 12/29/2018   Influenza Inj Mdck Quad Pf 12/16/2017   Influenza, High Dose Seasonal PF 12/01/2019   Influenza, Quadrivalent, Recombinant, Inj, Pf 12/29/2022   Influenza-Unspecified 12/16/2016   PFIZER(Purple Top)SARS-COV-2 Vaccination 04/03/2019, 04/24/2019, 12/14/2019    Family History  Problem Relation Age of Onset   Breast cancer Paternal Aunt    Breast  cancer Maternal Grandmother    Heart disease Mother    Heart disease Father    Scleroderma Brother      Current Outpatient Medications:    albuterol (PROVENTIL) (2.5 MG/3ML) 0.083% nebulizer solution, USE 1 VIAL VIA NEBULIZER EVERY 6 HOURS AS NEEDED FOR WHEEZING OR SHORTNESS OF BREATH, Disp: 450 mL, Rfl: 5   albuterol (VENTOLIN HFA) 108 (90 Base) MCG/ACT inhaler, Inhale 2 puffs into the lungs every 6 (six) hours as needed for wheezing or shortness of breath., Disp: 1 each, Rfl: 6   Cholecalciferol (VITAMIN D PO), Take 1 tablet by mouth daily., Disp: , Rfl:    hydroxychloroquine (PLAQUENIL) 200 MG tablet, Take by mouth., Disp: , Rfl:    levothyroxine (SYNTHROID, LEVOTHROID) 112 MCG tablet, Take 137 mcg by mouth daily., Disp: , Rfl:    omeprazole (PRILOSEC) 20 MG capsule, Take 20 mg by mouth daily., Disp: , Rfl: 1   ramipril (ALTACE) 10 MG capsule, Take 10 mg by mouth daily., Disp: , Rfl: 3   sertraline (ZOLOFT) 50 MG tablet, Take 50 mg by mouth daily., Disp: , Rfl:    umeclidinium-vilanterol (ANORO ELLIPTA) 62.5-25 MCG/ACT AEPB, INHALE 1 PUFF INTO LUNGS EVERY DAY, Disp: 60 each, Rfl: 5      Objective:   Vitals:   02/11/23 1001  BP: 138/68  Pulse: (!) 108  Temp: 99.6  F (37.6 C)  TempSrc: Oral  SpO2: 93%  Weight: 166 lb 6.4 oz (75.5 kg)  Height: 5\' 3"  (1.6 m)    Estimated body mass index is 29.48 kg/m as calculated from the following:   Height as of this encounter: 5\' 3"  (1.6 m).   Weight as of this encounter: 166 lb 6.4 oz (75.5 kg).  @WEIGHTCHANGE @  [employer]   02/11/23 1001  Weight: 166 lb 6.4 oz (75.5 kg)     Physical Exam   General: No distress. Looks well O2 at rest: no Cane present: no Sitting in wheel chair: no Frail: no Obese: no Neuro: Alert and Oriented x 3. GCS 15. Speech normal Psych: Pleasant Resp:  Barrel Chest - no.  Wheeze - no, Crackles - yes base, No overt respiratory distress CVS: Normal heart sounds. Murmurs - no Ext: Stigmata of Connective Tissue Disease - no HEENT: Normal upper airway. PEERL +. No post nasal drip        Assessment:       ICD-10-CM   1. Bronchiectasis with (acute) exacerbation (HCC)  J47.1          Plan:     Patient Instructions     ICD-10-CM   1. Bronchiectasis with (acute) exacerbation (HCC)  J47.1       You appear to have bronchiectasis with acute exacerbation  Plan -Do blood work for CBC with differential and blood IgE - Take doxycycline 100mg  po twice daily x 7 days; take after meals and avoid sunlight - Take prednisone 40 mg daily x 2 days, then 20mg  daily x 2 days, then 10mg  daily x 2 days, then 5mg  daily x 2 days and stop  -In 8-10 weeks do full pulmonary function test [last one 2019] -In 8-10 weeks do high-resolution CT chest [last 1 was in 2019)   Follow-up - Return to see nurse practitioner after test results and 10 weeks       FOLLOWUP Return in about 12 weeks (around 05/06/2023) for with any of the APPS, Bronchiectasis.    SIGNATURE    Dr. Kalman Shan, M.D., F.C.C.P,  Pulmonary and  Critical Care Medicine Staff Physician, [facility] Director - Interstitial Lung Disease  Program  Pulmonary Fibrosis [facility] at [facility] [place], [place], [address]  Pager: [phone], If no answer or between  15:00h - 7:00h: call [phone] Telephone: [phone]  10:18 AM 02/11/2023

## 2023-02-11 NOTE — Patient Instructions (Addendum)
ICD-10-CM   1. Bronchiectasis with (acute) exacerbation (HCC)  J47.1       You appear to have bronchiectasis with acute exacerbation  Plan -Do blood work for CBC with differential and blood IgE - Take doxycycline 100mg  po twice daily x 7 days; take after meals and avoid sunlight - Take prednisone 40 mg daily x 2 days, then 20mg  daily x 2 days, then 10mg  daily x 2 days, then 5mg  daily x 2 days and stop  -In 8-10 weeks do full pulmonary function test [last one 2019] -In 8-10 weeks do high-resolution CT chest [last 1 was in 2019)   Follow-up - Return to see nurse practitioner after test results and 10 weeks - 12 weeks

## 2023-02-11 NOTE — Addendum Note (Signed)
Addended by: Christen Butter on: 02/11/2023 10:30 AM   Modules accepted: Orders

## 2023-02-12 LAB — IGE: IgE (Immunoglobulin E), Serum: 4 kU/L (ref <=114)

## 2023-02-28 DIAGNOSIS — R058 Other specified cough: Secondary | ICD-10-CM | POA: Diagnosis not present

## 2023-02-28 DIAGNOSIS — K1121 Acute sialoadenitis: Secondary | ICD-10-CM | POA: Diagnosis not present

## 2023-03-06 DIAGNOSIS — I700 Atherosclerosis of aorta: Secondary | ICD-10-CM | POA: Diagnosis not present

## 2023-03-06 DIAGNOSIS — J449 Chronic obstructive pulmonary disease, unspecified: Secondary | ICD-10-CM | POA: Diagnosis not present

## 2023-03-06 DIAGNOSIS — K219 Gastro-esophageal reflux disease without esophagitis: Secondary | ICD-10-CM | POA: Diagnosis not present

## 2023-03-06 DIAGNOSIS — M3500 Sicca syndrome, unspecified: Secondary | ICD-10-CM | POA: Diagnosis not present

## 2023-03-06 DIAGNOSIS — I10 Essential (primary) hypertension: Secondary | ICD-10-CM | POA: Diagnosis not present

## 2023-03-06 DIAGNOSIS — K1120 Sialoadenitis, unspecified: Secondary | ICD-10-CM | POA: Diagnosis not present

## 2023-03-28 ENCOUNTER — Telehealth: Payer: Self-pay | Admitting: Pulmonary Disease

## 2023-03-28 NOTE — Telephone Encounter (Signed)
    Aimee Brown AE-Bronchiectatisis  Plan  - Take prednisone  40 mg daily x 2 days, then 20mg  daily x 2 days, then 10mg  daily x 2 days, then 5mg  daily x 2 days and stop -  Z PAK This time - ER if worse    has a past medical history of Depression, Hypercholesteremia, Hypertension, and Long-term use of high-risk medication (03/07/2020).   reports that she has never smoked. She has never used smokeless tobacco.  Past Surgical History:  Procedure Laterality Date   ABDOMINAL HYSTERECTOMY     CERVICAL LAMINECTOMY     CESAREAN SECTION     KIDNEY STONE SURGERY     LEFT HEART CATH AND CORONARY ANGIOGRAPHY N/A 07/10/2017   Procedure: LEFT HEART CATH AND CORONARY ANGIOGRAPHY;  Surgeon: Anner Alm ORN, MD;  Location: [facility] INVASIVE CV LAB;  Service: Cardiovascular;  Laterality: N/A;   SALPINGOOPHORECTOMY     TONSILLECTOMY      No Known Allergies  Immunization History  Administered Date(s) Administered   Fluad Quad(high Dose 65+) 12/29/2018   Influenza Inj Mdck Quad Pf 12/16/2017   Influenza, High Dose Seasonal PF 12/01/2019   Influenza, Quadrivalent, Recombinant, Inj, Pf 12/29/2022   Influenza-Unspecified 12/16/2016   PFIZER(Purple Top)SARS-COV-2 Vaccination 04/03/2019, 04/24/2019, 12/14/2019   PNEUMOCOCCAL CONJUGATE-20 09/03/2022   Respiratory Syncytial Virus Vaccine,Recomb Aduvanted(Arexvy) 03/25/2023    Family History  Problem Relation Age of Onset   Breast cancer Paternal Aunt    Breast cancer Maternal Grandmother    Heart disease Mother    Heart disease Father    Scleroderma Brother      Current Outpatient Medications:    albuterol  (PROVENTIL ) (2.5 MG/3ML) 0.083% nebulizer solution, USE 1 VIAL VIA NEBULIZER EVERY 6 HOURS AS NEEDED FOR WHEEZING OR SHORTNESS OF BREATH, Disp: 450 mL, Rfl: 5   albuterol  (VENTOLIN  HFA) 108 (90 Base) MCG/ACT inhaler, Inhale 2 puffs into the lungs every 6 (six) hours as needed for wheezing or shortness of breath., Disp: 1 each, Rfl: 6   Cholecalciferol  (VITAMIN D PO), Take 1 tablet by mouth daily., Disp: , Rfl:    doxycycline  (VIBRA -TABS) 100 MG tablet, Take 1 tablet (100 mg total) by mouth 2 (two) times daily., Disp: 14 tablet, Rfl: 0   hydroxychloroquine  (PLAQUENIL ) 200 MG tablet, Take by mouth., Disp: , Rfl:    levothyroxine  (SYNTHROID , LEVOTHROID) 112 MCG tablet, Take 137 mcg by mouth daily., Disp: , Rfl:    omeprazole (PRILOSEC) 20 MG capsule, Take 20 mg by mouth daily., Disp: , Rfl: 1   predniSONE  (DELTASONE ) 10 MG tablet, 4 x 2 days, 2 x 2 days, 1 x 2 days, 1/2 x 2 days then stop, Disp: 15 tablet, Rfl: 0   ramipril  (ALTACE ) 10 MG capsule, Take 10 mg by mouth daily., Disp: , Rfl: 3   sertraline  (ZOLOFT ) 50 MG tablet, Take 50 mg by mouth daily., Disp: , Rfl:    umeclidinium-vilanterol (ANORO ELLIPTA ) 62.5-25 MCG/ACT AEPB, INHALE 1 PUFF INTO LUNGS EVERY DAY, Disp: 60 each, Rfl: 5

## 2023-03-28 NOTE — Telephone Encounter (Signed)
 Been having a chronic cough for 3 weeks nothing is working she has taken OTC and vapor rub

## 2023-03-28 NOTE — Telephone Encounter (Signed)
 I called and spoke with the pt  She is c/o increased cough past 3 wks  She is coughing up large amounts of yellow to green sputum  She states that she feels SOB when she gets into coughing spells  She denies any fevers, aches, wheezing She states that she has tried robitussin and mucinex   Routing to MR since he seen pt most recent and Icard not available   No Known Allergies

## 2023-03-29 MED ORDER — AZITHROMYCIN 250 MG PO TABS: 250.0000 mg | ORAL_TABLET | ORAL | 0 refills | Status: DC

## 2023-03-29 MED ORDER — PREDNISONE 10 MG PO TABS: ORAL_TABLET | ORAL | 0 refills | Status: DC

## 2023-03-29 NOTE — Telephone Encounter (Signed)
 I called pt and there was no answer- LMTCB   I went ahead and sent her meds in

## 2023-04-01 DIAGNOSIS — M3500 Sicca syndrome, unspecified: Secondary | ICD-10-CM | POA: Diagnosis not present

## 2023-04-01 DIAGNOSIS — H0288B Meibomian gland dysfunction left eye, upper and lower eyelids: Secondary | ICD-10-CM | POA: Diagnosis not present

## 2023-04-01 DIAGNOSIS — H04123 Dry eye syndrome of bilateral lacrimal glands: Secondary | ICD-10-CM | POA: Diagnosis not present

## 2023-04-01 DIAGNOSIS — H1711 Central corneal opacity, right eye: Secondary | ICD-10-CM | POA: Diagnosis not present

## 2023-04-01 DIAGNOSIS — H47323 Drusen of optic disc, bilateral: Secondary | ICD-10-CM | POA: Diagnosis not present

## 2023-04-01 DIAGNOSIS — H0288A Meibomian gland dysfunction right eye, upper and lower eyelids: Secondary | ICD-10-CM | POA: Diagnosis not present

## 2023-04-01 DIAGNOSIS — Z79899 Other long term (current) drug therapy: Secondary | ICD-10-CM | POA: Diagnosis not present

## 2023-04-01 NOTE — Telephone Encounter (Signed)
 ATC patient to follow up.  LM to call office back if needed.

## 2023-04-01 NOTE — Telephone Encounter (Signed)
Patient is returning phone call. Patient phone number is [phone].

## 2023-04-10 ENCOUNTER — Ambulatory Visit
Admission: RE | Admit: 2023-04-10 | Discharge: 2023-04-10 | Disposition: A | Discharge status: Home / Self Care | Payer: PPO | Source: Ambulatory Visit | Attending: Internal Medicine | Admitting: Internal Medicine

## 2023-04-10 DIAGNOSIS — J471 Bronchiectasis with (acute) exacerbation: Secondary | ICD-10-CM

## 2023-04-10 DIAGNOSIS — J479 Bronchiectasis, uncomplicated: Secondary | ICD-10-CM | POA: Diagnosis not present

## 2023-04-10 DIAGNOSIS — I2510 Atherosclerotic heart disease of native coronary artery without angina pectoris: Secondary | ICD-10-CM | POA: Diagnosis not present

## 2023-04-10 DIAGNOSIS — I700 Atherosclerosis of aorta: Secondary | ICD-10-CM | POA: Diagnosis not present

## 2023-04-10 DIAGNOSIS — R053 Chronic cough: Secondary | ICD-10-CM | POA: Diagnosis not present

## 2023-04-17 ENCOUNTER — Ambulatory Visit: Payer: PPO | Admitting: Pulmonary Disease

## 2023-04-17 DIAGNOSIS — J471 Bronchiectasis with (acute) exacerbation: Secondary | ICD-10-CM

## 2023-04-17 LAB — PULMONARY FUNCTION TEST
DL/VA % pred: 124 %
DL/VA: 5.07 ml/min/mmHg/L
DLCO cor % pred: 64 %
DLCO cor: 11.65 ml/min/mmHg
DLCO unc % pred: 64 %
DLCO unc: 11.65 ml/min/mmHg
FEF 25-75 Post: 0.67 L/s
FEF 25-75 Pre: 0.42 L/s
FEF2575-%Change-Post: 59 %
FEF2575-%Pred-Post: 53 %
FEF2575-%Pred-Pre: 33 %
FEV1-%Change-Post: 13 %
FEV1-%Pred-Post: 53 %
FEV1-%Pred-Pre: 47 %
FEV1-Post: 0.96 L
FEV1-Pre: 0.85 L
FEV1FVC-%Change-Post: 5 %
FEV1FVC-%Pred-Pre: 87 %
FEV6-%Change-Post: 6 %
FEV6-%Pred-Post: 62 %
FEV6-%Pred-Pre: 58 %
FEV6-Post: 1.41 L
FEV6-Pre: 1.32 L
FEV6FVC-%Pred-Post: 106 %
FEV6FVC-%Pred-Pre: 106 %
FVC-%Change-Post: 7 %
FVC-%Pred-Post: 59 %
FVC-%Pred-Pre: 54 %
FVC-Post: 1.42 L
FVC-Pre: 1.32 L
Post FEV1/FVC ratio: 67 %
Post FEV6/FVC ratio: 100 %
Pre FEV1/FVC ratio: 64 %
Pre FEV6/FVC Ratio: 100 %
RV % pred: 120 %
RV: 2.87 L
TLC % pred: 86 %
TLC: 4.22 L

## 2023-04-17 NOTE — Patient Instructions (Signed)
Full PFT performed today.

## 2023-04-17 NOTE — Progress Notes (Signed)
Full PFT performed today.

## 2023-04-19 ENCOUNTER — Ambulatory Visit: Payer: PPO | Admitting: Adult Health

## 2023-04-22 ENCOUNTER — Ambulatory Visit: Payer: PPO | Admitting: Adult Health

## 2023-05-06 DIAGNOSIS — C44212 Basal cell carcinoma of skin of right ear and external auricular canal: Secondary | ICD-10-CM | POA: Diagnosis not present

## 2023-05-06 DIAGNOSIS — Z08 Encounter for follow-up examination after completed treatment for malignant neoplasm: Secondary | ICD-10-CM | POA: Diagnosis not present

## 2023-05-06 DIAGNOSIS — L570 Actinic keratosis: Secondary | ICD-10-CM | POA: Diagnosis not present

## 2023-05-06 DIAGNOSIS — Z85828 Personal history of other malignant neoplasm of skin: Secondary | ICD-10-CM | POA: Diagnosis not present

## 2023-05-06 DIAGNOSIS — L821 Other seborrheic keratosis: Secondary | ICD-10-CM | POA: Diagnosis not present

## 2023-05-06 DIAGNOSIS — D492 Neoplasm of unspecified behavior of bone, soft tissue, and skin: Secondary | ICD-10-CM | POA: Diagnosis not present

## 2023-05-06 DIAGNOSIS — L814 Other melanin hyperpigmentation: Secondary | ICD-10-CM | POA: Diagnosis not present

## 2023-05-06 DIAGNOSIS — D225 Melanocytic nevi of trunk: Secondary | ICD-10-CM | POA: Diagnosis not present

## 2023-05-10 ENCOUNTER — Ambulatory Visit: Payer: PPO | Admitting: Adult Health

## 2023-05-13 ENCOUNTER — Other Ambulatory Visit: Payer: Self-pay | Admitting: Pulmonary Disease

## 2023-05-15 DIAGNOSIS — E663 Overweight: Secondary | ICD-10-CM | POA: Diagnosis not present

## 2023-05-15 DIAGNOSIS — M1991 Primary osteoarthritis, unspecified site: Secondary | ICD-10-CM | POA: Diagnosis not present

## 2023-05-15 DIAGNOSIS — Z6828 Body mass index (BMI) 28.0-28.9, adult: Secondary | ICD-10-CM | POA: Diagnosis not present

## 2023-05-15 DIAGNOSIS — I7300 Raynaud's syndrome without gangrene: Secondary | ICD-10-CM | POA: Diagnosis not present

## 2023-05-15 DIAGNOSIS — R0609 Other forms of dyspnea: Secondary | ICD-10-CM | POA: Diagnosis not present

## 2023-05-15 DIAGNOSIS — M3501 Sicca syndrome with keratoconjunctivitis: Secondary | ICD-10-CM | POA: Diagnosis not present

## 2023-05-28 DIAGNOSIS — C44212 Basal cell carcinoma of skin of right ear and external auricular canal: Secondary | ICD-10-CM | POA: Diagnosis not present

## 2023-05-29 ENCOUNTER — Encounter: Payer: Self-pay | Admitting: Primary Care

## 2023-05-29 ENCOUNTER — Ambulatory Visit: Payer: PPO | Admitting: Pulmonary Disease

## 2023-05-29 ENCOUNTER — Ambulatory Visit: Payer: PPO | Admitting: Primary Care

## 2023-05-29 VITALS — BP 118/60 | HR 80 | Temp 98.5°F | Ht 63.0 in | Wt 165.0 lb

## 2023-05-29 DIAGNOSIS — J479 Bronchiectasis, uncomplicated: Secondary | ICD-10-CM

## 2023-05-29 NOTE — Progress Notes (Signed)
 @Patient  ID: Aimee Brown, female    DOB: 06/05/1940, 83 y.o.   MRN: [id number]  No chief complaint on file.   Referring provider: Geoffry Paradise, MD  HPI: 83 year old female, never smoked.  Past medical history significant for bronchiectasis. Former patient of Dr. Tonia Brooms. Last seen by Dr. Marchelle Gearing on February 11, 2023.   Previous [facility] pulmonary encounter:  06/04/22- Dr. Tonia Brooms This is a 83 year old female, history of bronchiectasis, prior CT imaging with cylindric bronchiectasis, Sjogren's syndrome on Plaquenil with dry eyes.  Also on Restasis recently started.  She has not had any significant exacerbations this past year.  She uses Anoro Ellipta daily.  As well as her albuterol nebulizers at least twice a day.  This to help maintain her airway clearance.   Plan: If she has frequent exacerbations would consider getting cultures again. At this time continue current bronchodilator regimen and pulmonary toileting regimen. Continue albuterol nebulizers at least twice a day. Encouraged her to remain physically active. Refills of meds for the next year. Return to clinic in 1 year or as needed.   02/11/23- Dr. Marchelle Gearing  Impression/Plan You appear to have bronchiectasis with acute exacerbation -Do blood work for CBC with differential and blood IgE - Take doxycycline 100mg  po twice daily x 7 days; take after meals and avoid sunlight - Take prednisone 40 mg daily x 2 days, then 20mg  daily x 2 days, then 10mg  daily x 2 days, then 5mg  daily x 2 days and stop   -In 8-10 weeks do full pulmonary function test [last one 2019] -In 8-10 weeks do high-resolution CT chest [last 1 was in 2019)     Follow-up - Return to see nurse practitioner after test results and 10 weeks    05/29/2023- interim hx  Discussed the use of AI scribe software for clinical note transcription with the patient, who gave verbal consent to proceed.  The patient, with bronchiectasis, presents with chronic cough and  bronchiectasis management. Prior CT imaging with cylindric bronchiectasis, Sjogren's syndrome on Plaquenil with dry eyes. She was treated for bronchiectasis flare up in November with Doxycycline and prednisone. She called in January 2025 with complaints of chronic cough x 3 weeks, she was treated for acute exacerbation of bronchiectasis with prednisone taper and Z-Pak.  Her symptoms improve with these treatments but do not completely resolve.  She experiences a chronic productive cough daily, with increased congestion in the mornings and yellowish-green sputum. She does not take daily medication for the cough but uses a nebulizer with albuterol twice a day. She has a flutter valve but does not use it regularly.  She has undergone a CT scan and breathing tests, which showed mild bronchiectasis and mosaic pulmonary parenchymal attenuation in the setting of small airway. She has been on Anoro since May 2019, following her diagnosis.  She denies ever smoking but has been exposed to secondhand smoke. Her past sputum cultures have been negative.     Labs: 02/11/23>> Wbc 12.2 /IgE 4/Eos absolute 0   Imaging: 04/10/23 HRCT>> showed no evidence of fibrotic interstitial lung disease, mosaic pulmonary parenchymal attenuation can be seen In the setting of small airway disease, mild cylindrical bronchiectasis  Pulmonary function testing: 04/17/2023 PFT>>showed severe obstructive airway disease   No Known Allergies  Immunization History  Administered Date(s) Administered   Fluad Quad(high Dose 65+) 12/29/2018   Influenza Inj Mdck Quad Pf 12/16/2017   Influenza, High Dose Seasonal PF 12/01/2019   Influenza, Quadrivalent, Recombinant, Inj, Pf 12/29/2022  Influenza-Unspecified 12/16/2016   PFIZER(Purple Top)SARS-COV-2 Vaccination 04/03/2019, 04/24/2019, 12/14/2019   PNEUMOCOCCAL CONJUGATE-20 09/03/2022   Respiratory Syncytial Virus Vaccine,Recomb Aduvanted(Arexvy) 03/25/2023    Past Medical History:   Diagnosis Date   Depression    Hypercholesteremia    Hypertension    Long-term use of high-risk medication 03/07/2020    Tobacco History: Social History   Tobacco Use  Smoking Status Never  Smokeless Tobacco Never   Counseling given: Not Answered   Outpatient Medications Prior to Visit  Medication Sig Dispense Refill   albuterol (PROVENTIL) (2.5 MG/3ML) 0.083% nebulizer solution USE 1 VIAL VIA NEBULIZER EVERY 6 HOURS AS NEEDED FOR WHEEZING OR SHORTNESS OF BREATH 450 mL 5   albuterol (VENTOLIN HFA) 108 (90 Base) MCG/ACT inhaler TAKE 2 PUFFS BY MOUTH EVERY 6 HOURS AS NEEDED FOR WHEEZE OR SHORTNESS OF BREATH 6.7 each 6   azithromycin (ZITHROMAX) 250 MG tablet Take 1 tablet (250 mg total) by mouth as directed. 6 tablet 0   Cholecalciferol (VITAMIN D PO) Take 1 tablet by mouth daily.     doxycycline (VIBRA-TABS) 100 MG tablet Take 1 tablet (100 mg total) by mouth 2 (two) times daily. 14 tablet 0   hydroxychloroquine (PLAQUENIL) 200 MG tablet Take by mouth.     levothyroxine (SYNTHROID, LEVOTHROID) 112 MCG tablet Take 137 mcg by mouth daily.     omeprazole (PRILOSEC) 20 MG capsule Take 20 mg by mouth daily.  1   predniSONE (DELTASONE) 10 MG tablet 4 x 2 days, 2 x 2 days, 1 x 2 days, 1/2 x 2 days then stop 15 tablet 0   predniSONE (DELTASONE) 10 MG tablet 4 x 2 days, 2 x 2 days, 1 x 2 days, 1/2 x 2 days, then stop 15 tablet 0   ramipril (ALTACE) 10 MG capsule Take 10 mg by mouth daily.  3   sertraline (ZOLOFT) 50 MG tablet Take 50 mg by mouth daily.     umeclidinium-vilanterol (ANORO ELLIPTA) 62.5-25 MCG/ACT AEPB INHALE 1 PUFF INTO LUNGS EVERY DAY 60 each 5   No facility-administered medications prior to visit.   Review of Systems  Review of Systems  Constitutional: Negative.   Respiratory:  Positive for cough. Negative for wheezing.    Physical Exam  There were no vitals taken for this visit. Physical Exam Constitutional:      General: She is not in acute distress.     Appearance: Normal appearance. She is not ill-appearing.  HENT:     Head: Normocephalic and atraumatic.     Mouth/Throat:     Mouth: Mucous membranes are moist.     Pharynx: Oropharynx is clear.  Cardiovascular:     Rate and Rhythm: Normal rate and regular rhythm.  Pulmonary:     Effort: Pulmonary effort is normal.     Breath sounds: No wheezing or rhonchi.  Musculoskeletal:        General: Normal range of motion.  Skin:    General: Skin is warm and dry.  Neurological:     General: No focal deficit present.     Mental Status: She is alert and oriented to person, place, and time. Mental status is at baseline.  Psychiatric:        Mood and Affect: Mood normal.        Behavior: Behavior normal.        Thought Content: Thought content normal.        Judgment: Judgment normal.      Lab Results:  CBC  Component Value Date/Time   WBC 12.2 (H) 02/11/2023 1041   RBC 4.25 02/11/2023 1041   HGB 12.8 02/11/2023 1041   HCT 38.7 02/11/2023 1041   PLT 158.0 02/11/2023 1041   MCV 91.0 02/11/2023 1041   MCHC 33.0 02/11/2023 1041   RDW 14.3 02/11/2023 1041   LYMPHSABS 0.6 (L) 02/11/2023 1041   MONOABS 1.4 (H) 02/11/2023 1041   EOSABS 0.0 02/11/2023 1041   BASOSABS 0.1 02/11/2023 1041    BMET No results found for: "NA", "K", "CL", "CO2", "GLUCOSE", "BUN", "CREATININE", "CALCIUM", "GFRNONAA", "GFRAA"  BNP No results found for: "BNP"  ProBNP No results found for: "PROBNP"  Imaging: No results found.   Assessment & Plan:       Bronchiectasis Chronic bronchiectasis with recurrent exacerbations. CT scan shows mild bronchiectasis without fibrosis. Emphasized pulmonary clearance to prevent infections. Discussed pulmonary hygiene. Considered hypertonic saline nebulizer; she declined percussion vest. - Use flutter valve three times daily. - Use albuterol nebulizer two to three times daily. - Take guaifenesin twice daily for two weeks, then once daily for maintenance. -  Monitor for changes in sputum color or volume. - Provide sputum cup for sample collection if colored sputum is produced.  Obstructive Lung Disease Obstructive lung disease likely secondary to bronchiectasis. Pulmonary function tests show airway obstruction. Avoiding inhaled steroids to prevent worsening of bronchiectasis. She has eosinophilia on lab work.  - Continue Anoro inhaler one puff daily  - Monitor for symptoms of exacerbation.       Glenford Bayley, NP 05/29/2023

## 2023-05-29 NOTE — Patient Instructions (Addendum)
 -  BRONCHIECTASIS: Bronchiectasis is a condition where the airways in the lungs become damaged and widened, leading to mucus build-up and infections. To manage this, you should use your flutter valve three times daily, continue using your albuterol nebulizer two to three times daily, and take guaifenesin twice daily for two weeks, then once daily for maintenance. Monitor for any changes in the color or volume of your sputum, and use the provided sputum cup to collect a sample if you notice colored sputum.  -OBSTRUCTIVE LUNG DISEASE: Obstructive lung disease is a condition where airflow is blocked, making it hard to breathe. This is likely due to your bronchiectasis. Continue using your Anoro inhaler as prescribed and monitor for any symptoms of exacerbation.  INSTRUCTIONS:  Please follow the updated treatment plan and monitor your symptoms closely. If you notice any changes in your sputum or if your symptoms worsen, contact our office immediately. Continue using your Anoro inhaler and follow the guidelines for using the flutter valve, albuterol nebulizer, and guaifenesin. Collect a sputum sample if you notice colored sputum and bring it to your next appointment.   Follow-up 4 months with Dr. Francine Graven or Dr. Judeth Horn for bronchiectasis (former DR. McQuaid- 30 min slot)

## 2023-06-04 DIAGNOSIS — H31091 Other chorioretinal scars, right eye: Secondary | ICD-10-CM | POA: Diagnosis not present

## 2023-06-04 DIAGNOSIS — H04123 Dry eye syndrome of bilateral lacrimal glands: Secondary | ICD-10-CM | POA: Diagnosis not present

## 2023-06-04 DIAGNOSIS — H43813 Vitreous degeneration, bilateral: Secondary | ICD-10-CM | POA: Diagnosis not present

## 2023-06-04 DIAGNOSIS — H179 Unspecified corneal scar and opacity: Secondary | ICD-10-CM | POA: Diagnosis not present

## 2023-06-04 DIAGNOSIS — Z79899 Other long term (current) drug therapy: Secondary | ICD-10-CM | POA: Diagnosis not present

## 2023-06-13 ENCOUNTER — Telehealth: Payer: Self-pay

## 2023-06-13 ENCOUNTER — Other Ambulatory Visit

## 2023-06-13 DIAGNOSIS — J479 Bronchiectasis, uncomplicated: Secondary | ICD-10-CM | POA: Diagnosis not present

## 2023-06-13 MED ORDER — PROMETHAZINE-DM 6.25-15 MG/5ML PO SYRP: 5.0000 mL | ORAL_SOLUTION | Freq: Four times a day (QID) | ORAL | 0 refills | Status: DC | PRN

## 2023-06-13 MED ORDER — SODIUM CHLORIDE 3 % IN NEBU: INHALATION_SOLUTION | Freq: Two times a day (BID) | RESPIRATORY_TRACT | 1 refills | Status: DC | PRN

## 2023-06-13 NOTE — Telephone Encounter (Signed)
 I will send in Promethazine DM QID for cough and hypertonic saline nebulizer twice daily as needed for cough/congestion

## 2023-06-13 NOTE — Telephone Encounter (Signed)
 Spoke with patient. She is doing flutter valve 2 times a day, nebulizer 2 times a day, using guafenesin daily as well as anoro once daily. She is getting ready to do a sputum sample and bring it by like requested at last visit. She is asking what we can do for her cough now. Please advise.

## 2023-06-13 NOTE — Telephone Encounter (Signed)
 Results have been relayed to the patient/authorized caretaker. The patient/authorized caretaker verbalized understanding. No questions at this time.  Sputum culture dropped off today.

## 2023-06-14 LAB — RESPIRATORY CULTURE OR RESPIRATORY AND SPUTUM CULTURE: MICRO NUMBER:: 16256083

## 2023-06-17 DIAGNOSIS — Z481 Encounter for planned postprocedural wound closure: Secondary | ICD-10-CM | POA: Diagnosis not present

## 2023-06-17 DIAGNOSIS — L905 Scar conditions and fibrosis of skin: Secondary | ICD-10-CM | POA: Diagnosis not present

## 2023-06-20 ENCOUNTER — Telehealth: Payer: Self-pay

## 2023-06-20 ENCOUNTER — Other Ambulatory Visit: Payer: Self-pay

## 2023-06-20 DIAGNOSIS — R053 Chronic cough: Secondary | ICD-10-CM

## 2023-06-20 DIAGNOSIS — J471 Bronchiectasis with (acute) exacerbation: Secondary | ICD-10-CM

## 2023-06-20 NOTE — Telephone Encounter (Signed)
 Copied from CRM [id number]. Topic: Clinical - Prescription Issue >> Jun 17, 2023 11:46 AM Adele Barthel wrote: Reason for CRM:   Pharmacist from [facility] calling in regards to the sodium chloride HYPERTONIC 3 % nebulizer solution sent in on 03/27  Pharmacist reports the only vial size they have in stock is the 15 ml and she knows this will be too much to put into the nebulizer. She tried explaining to the patient how much to put in the nebulizer for each treatment, but she seemed confused.   Pharmacist requests if clinic could reach out to the patient and explain to her the needed amount of the medication she will need to put in the nebulizer for each treatment.   CB#   [phone]   Spoke w/ pt only comes in would like clarification on amount to use.  Beth please advise

## 2023-06-20 NOTE — Telephone Encounter (Signed)
 Please call patient and let her know the vials for hypertonic saline only come in 15ml. She should use 2ml per treatment (just enough to do the treatment for 5 mins)  If she is unable to do this safely, we can place and order for a smart vest

## 2023-06-20 NOTE — Progress Notes (Signed)
 Sputum culture was contaminated by oropharyngeal flora, contained more than 25 or more squamous epithelial cells   Repeat if able

## 2023-06-20 NOTE — Telephone Encounter (Signed)
 This has been completed.  See encounter from 06/20/2023.  Will close encounter.

## 2023-06-20 NOTE — Telephone Encounter (Signed)
 Left message on VM for patient to call office to give information about hypertonic sodium chloride saline to use in nebulizer.

## 2023-06-20 NOTE — Telephone Encounter (Signed)
 Spoke w/ pt she understands how to measure the amount she needs for her neb treatment.

## 2023-06-21 ENCOUNTER — Other Ambulatory Visit: Payer: Self-pay

## 2023-06-21 DIAGNOSIS — J479 Bronchiectasis, uncomplicated: Secondary | ICD-10-CM

## 2023-07-14 DIAGNOSIS — R300 Dysuria: Secondary | ICD-10-CM | POA: Diagnosis not present

## 2023-07-14 DIAGNOSIS — N390 Urinary tract infection, site not specified: Secondary | ICD-10-CM | POA: Diagnosis not present

## 2023-07-14 DIAGNOSIS — R350 Frequency of micturition: Secondary | ICD-10-CM | POA: Diagnosis not present

## 2023-08-02 ENCOUNTER — Ambulatory Visit: Payer: Self-pay | Admitting: Primary Care

## 2023-08-02 DIAGNOSIS — J471 Bronchiectasis with (acute) exacerbation: Secondary | ICD-10-CM

## 2023-08-02 LAB — AFB IDENTIFICATION BY PCR
M avium complex: POSITIVE — AB
M tuberculosis complex: NEGATIVE

## 2023-08-02 LAB — SPECIMEN STATUS REPORT

## 2023-08-02 LAB — AFB CULTURE WITH SMEAR (NOT AT ARMC)
Acid Fast Culture: POSITIVE — AB
Acid Fast Smear: NEGATIVE

## 2023-08-02 NOTE — Progress Notes (Signed)
 Sounds good.   Ashlyn can you ask patient to come by and pick up another sputum cup, we need to repeat culture for AFB (please order)

## 2023-08-02 NOTE — Progress Notes (Signed)
 AFB positive Do you want me to repeat or refer to ID? Patient has bronchiectasis  You are seeing her July 2nd

## 2023-08-09 ENCOUNTER — Other Ambulatory Visit: Payer: Self-pay | Admitting: Emergency Medicine

## 2023-08-09 DIAGNOSIS — J471 Bronchiectasis with (acute) exacerbation: Secondary | ICD-10-CM

## 2023-08-09 DIAGNOSIS — R053 Chronic cough: Secondary | ICD-10-CM | POA: Diagnosis not present

## 2023-08-10 LAB — RESPIRATORY CULTURE OR RESPIRATORY AND SPUTUM CULTURE: MICRO NUMBER:: 16494816

## 2023-08-11 ENCOUNTER — Encounter (HOSPITAL_COMMUNITY): Payer: Self-pay | Admitting: Emergency Medicine

## 2023-08-11 ENCOUNTER — Inpatient Hospital Stay (HOSPITAL_COMMUNITY)
Admission: EM | Admit: 2023-08-11 | Discharge: 2023-08-16 | DRG: 189 | Disposition: A | Discharge status: Home / Self Care | Attending: Internal Medicine | Admitting: Internal Medicine

## 2023-08-11 ENCOUNTER — Other Ambulatory Visit: Payer: Self-pay

## 2023-08-11 ENCOUNTER — Emergency Department (HOSPITAL_COMMUNITY)

## 2023-08-11 DIAGNOSIS — Z66 Do not resuscitate: Secondary | ICD-10-CM | POA: Diagnosis not present

## 2023-08-11 DIAGNOSIS — J441 Chronic obstructive pulmonary disease with (acute) exacerbation: Principal | ICD-10-CM | POA: Diagnosis present

## 2023-08-11 DIAGNOSIS — F32A Depression, unspecified: Secondary | ICD-10-CM | POA: Diagnosis not present

## 2023-08-11 DIAGNOSIS — J189 Pneumonia, unspecified organism: Secondary | ICD-10-CM | POA: Diagnosis not present

## 2023-08-11 DIAGNOSIS — Z79899 Other long term (current) drug therapy: Secondary | ICD-10-CM | POA: Diagnosis not present

## 2023-08-11 DIAGNOSIS — Z8249 Family history of ischemic heart disease and other diseases of the circulatory system: Secondary | ICD-10-CM

## 2023-08-11 DIAGNOSIS — A310 Pulmonary mycobacterial infection: Secondary | ICD-10-CM | POA: Diagnosis not present

## 2023-08-11 DIAGNOSIS — R0689 Other abnormalities of breathing: Secondary | ICD-10-CM | POA: Diagnosis not present

## 2023-08-11 DIAGNOSIS — E871 Hypo-osmolality and hyponatremia: Secondary | ICD-10-CM | POA: Diagnosis not present

## 2023-08-11 DIAGNOSIS — J479 Bronchiectasis, uncomplicated: Secondary | ICD-10-CM | POA: Diagnosis not present

## 2023-08-11 DIAGNOSIS — I10 Essential (primary) hypertension: Secondary | ICD-10-CM | POA: Diagnosis not present

## 2023-08-11 DIAGNOSIS — Z79890 Hormone replacement therapy (postmenopausal): Secondary | ICD-10-CM | POA: Diagnosis not present

## 2023-08-11 DIAGNOSIS — K219 Gastro-esophageal reflux disease without esophagitis: Secondary | ICD-10-CM | POA: Diagnosis present

## 2023-08-11 DIAGNOSIS — R0603 Acute respiratory distress: Secondary | ICD-10-CM | POA: Diagnosis not present

## 2023-08-11 DIAGNOSIS — I119 Hypertensive heart disease without heart failure: Secondary | ICD-10-CM | POA: Diagnosis present

## 2023-08-11 DIAGNOSIS — R0989 Other specified symptoms and signs involving the circulatory and respiratory systems: Secondary | ICD-10-CM | POA: Diagnosis not present

## 2023-08-11 DIAGNOSIS — E039 Hypothyroidism, unspecified: Secondary | ICD-10-CM | POA: Diagnosis not present

## 2023-08-11 DIAGNOSIS — J9601 Acute respiratory failure with hypoxia: Secondary | ICD-10-CM | POA: Diagnosis not present

## 2023-08-11 DIAGNOSIS — R5383 Other fatigue: Secondary | ICD-10-CM | POA: Diagnosis not present

## 2023-08-11 DIAGNOSIS — R0902 Hypoxemia: Secondary | ICD-10-CM | POA: Diagnosis not present

## 2023-08-11 DIAGNOSIS — J159 Unspecified bacterial pneumonia: Secondary | ICD-10-CM | POA: Diagnosis not present

## 2023-08-11 DIAGNOSIS — J440 Chronic obstructive pulmonary disease with acute lower respiratory infection: Secondary | ICD-10-CM | POA: Diagnosis not present

## 2023-08-11 DIAGNOSIS — E7800 Pure hypercholesterolemia, unspecified: Secondary | ICD-10-CM | POA: Diagnosis present

## 2023-08-11 DIAGNOSIS — R0602 Shortness of breath: Secondary | ICD-10-CM | POA: Diagnosis not present

## 2023-08-11 DIAGNOSIS — I517 Cardiomegaly: Secondary | ICD-10-CM | POA: Diagnosis not present

## 2023-08-11 DIAGNOSIS — E785 Hyperlipidemia, unspecified: Secondary | ICD-10-CM | POA: Diagnosis not present

## 2023-08-11 LAB — BLOOD GAS, VENOUS
Acid-Base Excess: 1.6 mmol/L (ref 0.0–2.0)
Bicarbonate: 29.8 mmol/L — ABNORMAL HIGH (ref 20.0–28.0)
O2 Saturation: 42.6 %
Patient temperature: 37
pCO2, Ven: 62 mmHg — ABNORMAL HIGH (ref 44–60)
pH, Ven: 7.29 (ref 7.25–7.43)
pO2, Ven: <31 mmHg — CL (ref 32–45)

## 2023-08-11 LAB — COMPREHENSIVE METABOLIC PANEL WITH GFR
ALT: 17 U/L (ref 0–44)
AST: 25 U/L (ref 15–41)
Albumin: 3.6 g/dL (ref 3.5–5.0)
Alkaline Phosphatase: 64 U/L (ref 38–126)
Anion gap: 11 (ref 5–15)
BUN: 13 mg/dL (ref 8–23)
CO2: 27 mmol/L (ref 22–32)
Calcium: 8.5 mg/dL — ABNORMAL LOW (ref 8.9–10.3)
Chloride: 98 mmol/L (ref 98–111)
Creatinine, Ser: 0.78 mg/dL (ref 0.44–1.00)
GFR, Estimated: >60 mL/min (ref >60)
Glucose, Bld: 216 mg/dL — ABNORMAL HIGH (ref 70–99)
Potassium: 3.2 mmol/L — ABNORMAL LOW (ref 3.5–5.1)
Sodium: 136 mmol/L (ref 135–145)
Total Bilirubin: 0.5 mg/dL (ref 0.0–1.2)
Total Protein: 6.5 g/dL (ref 6.5–8.1)

## 2023-08-11 LAB — RESP PANEL BY RT-PCR (RSV, FLU A&B, COVID)  RVPGX2
Influenza A by PCR: NEGATIVE
Influenza A by PCR: NEGATIVE
Influenza B by PCR: NEGATIVE
Influenza B by PCR: NEGATIVE
Resp Syncytial Virus by PCR: NEGATIVE
Resp Syncytial Virus by PCR: NEGATIVE
SARS Coronavirus 2 by RT PCR: NEGATIVE
SARS Coronavirus 2 by RT PCR: NEGATIVE

## 2023-08-11 LAB — CBC WITH DIFFERENTIAL/PLATELET
Abs Immature Granulocytes: 0.02 10*3/uL (ref 0.00–0.07)
Basophils Absolute: 0 10*3/uL (ref 0.0–0.1)
Basophils Relative: 0 %
Eosinophils Absolute: 0 10*3/uL (ref 0.0–0.5)
Eosinophils Relative: 0 %
HCT: 40.3 % (ref 36.0–46.0)
Hemoglobin: 12.8 g/dL (ref 12.0–15.0)
Immature Granulocytes: 0 %
Lymphocytes Relative: 12 %
Lymphs Abs: 0.8 10*3/uL (ref 0.7–4.0)
MCH: 29.3 pg (ref 26.0–34.0)
MCHC: 31.8 g/dL (ref 30.0–36.0)
MCV: 92.2 fL (ref 80.0–100.0)
Monocytes Absolute: 0.4 10*3/uL (ref 0.1–1.0)
Monocytes Relative: 6 %
Neutro Abs: 5.1 10*3/uL (ref 1.7–7.7)
Neutrophils Relative %: 82 %
Platelets: 115 10*3/uL — ABNORMAL LOW (ref 150–400)
RBC: 4.37 MIL/uL (ref 3.87–5.11)
RDW: 13.7 % (ref 11.5–15.5)
WBC: 6.2 10*3/uL (ref 4.0–10.5)
nRBC: 0 % (ref 0.0–0.2)

## 2023-08-11 LAB — MAGNESIUM: Magnesium: 1.7 mg/dL (ref 1.7–2.4)

## 2023-08-11 LAB — MRSA NEXT GEN BY PCR, NASAL: MRSA by PCR Next Gen: NOT DETECTED

## 2023-08-11 LAB — BRAIN NATRIURETIC PEPTIDE: B Natriuretic Peptide: 230.4 pg/mL — ABNORMAL HIGH (ref 0.0–100.0)

## 2023-08-11 MED ORDER — SODIUM CHLORIDE 0.9 % IV SOLN: 500.0000 mg | INTRAVENOUS | Status: DC

## 2023-08-11 MED ORDER — ONDANSETRON HCL 4 MG PO TABS: 4.0000 mg | ORAL_TABLET | Freq: Four times a day (QID) | ORAL | Status: DC | PRN

## 2023-08-11 MED ORDER — ENOXAPARIN SODIUM 40 MG/0.4ML IJ SOSY: 40.0000 mg | PREFILLED_SYRINGE | INTRAMUSCULAR | Status: DC

## 2023-08-11 MED ORDER — ACETAMINOPHEN 650 MG RE SUPP: 650.0000 mg | Freq: Four times a day (QID) | RECTAL | Status: DC | PRN

## 2023-08-11 MED ORDER — AZITHROMYCIN 250 MG PO TABS: 500.0000 mg | ORAL_TABLET | Freq: Every day | ORAL | Status: DC

## 2023-08-11 MED ORDER — ACETAMINOPHEN 325 MG PO TABS
650.0000 mg | ORAL_TABLET | Freq: Four times a day (QID) | ORAL | Status: DC | PRN
  Administered 2023-08-12 – 2023-08-15 (×5): 650 mg via ORAL
  Filled 2023-08-11 (×5): qty 2

## 2023-08-11 MED ORDER — ONDANSETRON HCL 4 MG/2ML IJ SOLN: 4.0000 mg | Freq: Four times a day (QID) | INTRAMUSCULAR | Status: DC | PRN

## 2023-08-11 MED ORDER — POTASSIUM CHLORIDE CRYS ER 20 MEQ PO TBCR
40.0000 meq | EXTENDED_RELEASE_TABLET | Freq: Once | ORAL | Status: AC
  Administered 2023-08-11: 40 meq via ORAL
  Filled 2023-08-11: qty 2

## 2023-08-11 MED ORDER — METHYLPREDNISOLONE SODIUM SUCC 40 MG IJ SOLR: 40.0000 mg | Freq: Two times a day (BID) | INTRAMUSCULAR | Status: DC

## 2023-08-11 MED ORDER — ENOXAPARIN SODIUM 40 MG/0.4ML IJ SOSY
40.0000 mg | PREFILLED_SYRINGE | Freq: Every day | INTRAMUSCULAR | Status: DC
  Administered 2023-08-11 – 2023-08-15 (×5): 40 mg via SUBCUTANEOUS
  Filled 2023-08-11 (×5): qty 0.4

## 2023-08-11 MED ORDER — METHYLPREDNISOLONE SODIUM SUCC 125 MG IJ SOLR
81.2500 mg | Freq: Two times a day (BID) | INTRAMUSCULAR | Status: DC
  Administered 2023-08-11 – 2023-08-13 (×4): 81.25 mg via INTRAVENOUS
  Filled 2023-08-11 (×4): qty 2

## 2023-08-11 MED ORDER — SODIUM CHLORIDE 0.9 % IV SOLN
500.0000 mg | Freq: Once | INTRAVENOUS | Status: AC
  Administered 2023-08-11: 500 mg via INTRAVENOUS
  Filled 2023-08-11: qty 5

## 2023-08-11 MED ORDER — IPRATROPIUM-ALBUTEROL 0.5-2.5 (3) MG/3ML IN SOLN
3.0000 mL | RESPIRATORY_TRACT | Status: DC
  Administered 2023-08-11 – 2023-08-13 (×14): 3 mL via RESPIRATORY_TRACT
  Filled 2023-08-11 (×14): qty 3

## 2023-08-11 MED ORDER — PREDNISONE 20 MG PO TABS: 40.0000 mg | ORAL_TABLET | Freq: Every day | ORAL | Status: DC

## 2023-08-11 MED ORDER — AZITHROMYCIN 250 MG PO TABS
500.0000 mg | ORAL_TABLET | Freq: Every day | ORAL | Status: AC
  Administered 2023-08-12 – 2023-08-13 (×2): 500 mg via ORAL
  Filled 2023-08-11 (×2): qty 2

## 2023-08-11 NOTE — ED Notes (Signed)
 Pt. Placed on O2 @ 2 liters. Pt. O2 dropped below 90% after neb txt and walking to bathroom. MD aware.

## 2023-08-11 NOTE — H&P (Addendum)
 History and Physical    Aimee JORGE UJW:[id number] DOB: 11/17/1940 DOA: 08/11/2023  DOS: the patient was seen and examined on 08/11/2023  PCP: Suan Elm, MD   Patient coming from: Home  I have personally briefly reviewed patient's old medical records in [facility]  No notes on file   HPI: This is an 83 year old female W/PMH of HTN, HLD, depression, history of positive Mycobacterium AVM intracellular who was brought into emergency room by ambulance from urgent care for possible pneumonia.  Patient oxygen was initially 88% on room air with expiratory wheezing.  Patient was given treatment with ceftriaxone  and IV steroid at urgent care.  And route EMS gave albuterol , Atrovent, Solu-Medrol and Tylenol .  Patient is stated that she uses her nebulization and the rescue inhalers but did not get better and decided to come to emergency room. Per chart review, she recently underwent a sputum cultures which were positive for AFB.  Plan was for repeat cultures.  However, Dr. 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  They will make the decision later when they have an outpatient appointment in July.  Patient stated that she had productive sputum with sometimes green and sometimes a gray color.  As she had shortness of breath, wheezing and low oxygen in high 80s at urgent care she was treated with albuterol , Atrovent, ceftriaxone , Kenalog injection as mentioned above at the urgent care. In the ED, patient underwent workup with chest x-ray which did not show any focal pneumonia, patient was wheezing hide shortness of breath and coughing and it was decided that patient has COPD exacerbation.  Hospitalist service was consulted for evaluation for admission for COPD exacerbation.  Review of Systems:  ROS Patient denies any fever chills nausea vomiting abdominal pain chest pain  palpitations. She had been having cough and shortness of breath for some time.  Past Medical History:  Diagnosis Date   Depression    Hypercholesteremia    Hypertension    Long-term use of high-risk medication 03/07/2020    Past Surgical History:  Procedure Laterality Date   ABDOMINAL HYSTERECTOMY     CERVICAL LAMINECTOMY     CESAREAN SECTION     KIDNEY STONE SURGERY     LEFT HEART CATH AND CORONARY ANGIOGRAPHY N/A 07/10/2017   Procedure: LEFT HEART CATH AND CORONARY ANGIOGRAPHY;  Surgeon: Arleen Lacer, MD;  Location: [facility] INVASIVE CV LAB;  Service: Cardiovascular;  Laterality: N/A;   SALPINGOOPHORECTOMY     TONSILLECTOMY       reports that she has never smoked. She has never used smokeless tobacco. She reports that she does not drink alcohol  and does not use drugs.  No Known Allergies  Family History  Problem Relation Age of Onset   Breast cancer Paternal Aunt    Breast cancer Maternal Grandmother    Heart disease Mother    Heart disease Father    Scleroderma Brother     Prior to Admission medications   Medication Sig Start Date End Date Taking? Authorizing Provider  albuterol  (PROVENTIL ) (2.5 MG/3ML) 0.083% nebulizer solution USE 1 VIAL VIA NEBULIZER EVERY 6 HOURS AS NEEDED FOR WHEEZING OR SHORTNESS OF BREATH 12/31/22   Icard, Dariel Edelson L, DO  albuterol  (VENTOLIN  HFA) 108 (90 Base) MCG/ACT inhaler TAKE 2 PUFFS BY MOUTH EVERY 6 HOURS AS NEEDED FOR WHEEZE OR SHORTNESS OF BREATH 05/17/23   Maire Scot, MD  azithromycin  (ZITHROMAX ) 250 MG tablet Take 1 tablet (250 mg total) by mouth as directed. Patient not taking: Reported on 05/29/2023 03/29/23   Maire Scot, MD  Cholecalciferol (VITAMIN D PO) Take 1 tablet by mouth daily.    [provider]  doxycycline  (VIBRA -TABS) 100 MG tablet Take 1 tablet (100 mg total) by mouth 2 (two) times daily. Patient not taking: Reported on 05/29/2023 02/11/23   Maire Scot, MD  hydroxychloroquine (PLAQUENIL) 200 MG  tablet Take by mouth.    [provider]  levothyroxine (SYNTHROID, LEVOTHROID) 112 MCG tablet Take 137 mcg by mouth daily.    [provider]  omeprazole (PRILOSEC) 20 MG capsule Take 20 mg by mouth daily. 07/08/17   [provider]  predniSONE  (DELTASONE ) 10 MG tablet 4 x 2 days, 2 x 2 days, 1 x 2 days, 1/2 x 2 days then stop Patient not taking: Reported on 05/29/2023 02/11/23   Maire Scot, MD  predniSONE  (DELTASONE ) 10 MG tablet 4 x 2 days, 2 x 2 days, 1 x 2 days, 1/2 x 2 days, then stop 03/29/23   Maire Scot, MD  promethazine -dextromethorphan (PROMETHAZINE -DM) 6.25-15 MG/5ML syrup Take 5 mLs by mouth 4 (four) times daily as needed for cough. 06/13/23   Antonio Baumgarten, NP  ramipril (ALTACE) 10 MG capsule Take 10 mg by mouth daily. 06/12/17   [provider]  sertraline (ZOLOFT) 50 MG tablet Take 50 mg by mouth daily.    [provider]  sodium chloride  HYPERTONIC 3 % nebulizer solution Take by nebulization 2 (two) times daily as needed for other or cough. 06/13/23   Antonio Baumgarten, NP  umeclidinium-vilanterol (ANORO ELLIPTA ) 62.5-25 MCG/ACT AEPB INHALE 1 PUFF INTO LUNGS EVERY DAY 12/31/22   Prudy Brownie, DO    Physical Exam: Vitals:   08/11/23 1107 08/11/23 1117 08/11/23 1118 08/11/23 1425  BP: (!) 164/132   (!) 158/67  Pulse: (!) 103   96  Resp: (!) 24   20  Temp: (!) 97.3 F (36.3 C)     TempSrc: Oral     SpO2: 100% 100%  100%  Weight:   73.5 kg   Height:   5\' 3"  (1.6 m)     Physical Exam  Constitutional: NAD, calm, comfortable Eyes: PERRL, lids and conjunctivae normal ENMT: Mucous membranes are moist. Posterior pharynx clear of any exudate or lesions.Normal dentition.  Neck: normal, supple, no masses, no thyromegaly Respiratory: Bilateral decreased air entry at the bases, extensive wheezes and occasional scattered rhonchi on bilateral lung fields. Cardiovascular: Regular rate and rhythm, no murmurs / rubs /  gallops. No extremity edema. 2+ pedal pulses. No carotid bruits.  Abdomen: no tenderness, no masses palpated. No hepatosplenomegaly. Bowel sounds positive.  Musculoskeletal: no clubbing / cyanosis. No joint deformity upper and lower extremities. Good ROM, no contractures. Normal muscle tone.  Skin: no rashes, lesions, ulcers. No induration Neurologic: CN 2-12 grossly intact. Sensation intact, DTR normal. Strength 5/5 x all 4 extremities.  Psychiatric: Normal judgment and insight. Alert and oriented x 3. Normal mood.   Labs on Admission: I have personally reviewed following labs and imaging studies  CBC: Recent Labs  Lab 08/11/23 1142  WBC 6.2  NEUTROABS 5.1  HGB 12.8  HCT 40.3  MCV 92.2  PLT 115*   Basic Metabolic Panel: Recent Labs  Lab 08/11/23 1142  NA 136  K 3.2*  CL 98  CO2 27  GLUCOSE 216*  BUN 13  CREATININE 0.78  CALCIUM 8.5*  MG 1.7   GFR: Estimated Creatinine Clearance: 52 mL/min (by C-G formula based on SCr of 0.78 mg/dL). Liver Function Tests: Recent Labs  Lab 08/11/23 1142  AST 25  ALT 17  ALKPHOS 64  BILITOT 0.5  PROT 6.5  ALBUMIN 3.6   No results for input(s): "LIPASE", "AMYLASE" in the last 168 hours. No results for input(s): "AMMONIA" in the last 168 hours. Coagulation Profile: No results for input(s): "INR", "PROTIME" in the last 168 hours. Cardiac Enzymes: No results for input(s): "CKTOTAL", "CKMB", "CKMBINDEX", "TROPONINI", "TROPONINIHS" in the last 168 hours. BNP (last 3 results) Recent Labs    08/11/23 1142  BNP 230.4*    Radiological Exams on Admission: I have personally reviewed images DG Chest Port 1 View Result Date: 08/11/2023 EXAM: 1 VIEW XRAY OF THE CHEST 08/11/2023 12:30:00 PM COMPARISON: None available. CLINICAL HISTORY: Shortness of breath for 5 days. FINDINGS: LUNGS AND PLEURA: No focal pulmonary opacity. No pulmonary edema. No pleural effusion. No pneumothorax. HEART AND MEDIASTINUM: Borderline cardiac enlargement is  exaggerated by low lung volumes. BONES AND SOFT TISSUES: No acute osseous abnormality. IMPRESSION: 1. No acute findings. 2. Borderline cardiac enlargement, exaggerated by low lung volumes. Electronically signed by: Audree Leas MD 08/11/2023 12:41 PM EDT RP Workstation: [id number]    EKG: My personal interpretation of EKG shows: N/A    Assessment/Plan Principal Problem:   COPD exacerbation (HCC) Active Problems:   Hypertensive heart disease without CHF   Hyperlipidemia    Assessment and Plan: This is an 83 year old female W/PMH of HTN, HLD, depression, history of positive Mycobacterium AVM intracellular who was brought into emergency room by ambulance from urgent care for possible pneumonia.  It appears that she has a COPD exacerbation with some hypoxemia, shortness of breath, cough production. She will be admitted to hospital as inpatient. She has a history of positive AFB and she will be placed in isolation and will get samples for AFB.  I did discuss with Dr. 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  However per protocol of the hospital if patient has AFB being checked that needs to be moved to isolation room.  Patient has been moved to isolation room for ID recommendation. She will be given Solu-Medrol IV, nebulization, oxygen as needed to maintain saturation more than 90%.   She will also be given antibiotics for COPD protocol.  She has a history of hypertension and hyperlipidemia. Will continue her home medications for chronic medical conditions.     DVT prophylaxis: Lovenox Code Status: Full Code, patient is DNI Family Communication: Patient husband was at bedside and discussed with him along with the patient in detail. Disposition Plan: Home Consults called: Not needed but discussed with pulmonary over the secure chat, Dr. Felipe Horton advised that if there is any positive finding only will need to call otherwise she has outpatient  appointment to see the pulmonologist as outpatient.  Admission status: Inpatient, Med-Surg   Beatris Bough, MD [facility] 08/11/2023, 3:04 PM

## 2023-08-11 NOTE — Plan of Care (Signed)
   Problem: Clinical Measurements: Goal: Ability to maintain clinical measurements within normal limits will improve Outcome: Progressing Goal: Will remain free from infection Outcome: Progressing Goal: Diagnostic test results will improve Outcome: Progressing Goal: Respiratory complications will improve Outcome: Progressing

## 2023-08-11 NOTE — ED Provider Notes (Signed)
 [facility] Provider Note   CSN: [id number] Arrival date & time: 08/11/23  1055     History  Chief Complaint  Patient presents with   Shortness of Breath    Aimee Brown is a 83 y.o. female.  HPI Patient presents for shortness of breath.  Medical history includes HTN, HLD, depression.  Although not listed in her EMR, she likely has reactive airway disease as well, she does twice daily nebulized breathing treatments at home daily.  Per chart review, she recently underwent sputum cultures which were AFB positive.  Plan is for repeat cultures.  Patient has had worsening shortness of breath over the past 5 days.  She has had a cough that has been productive of sputum that is sometimes green and sometimes gray in color.  She went to urgent care earlier today.  They noted diffuse wheezing and hypoxia in the high 80s.  She is not on oxygen at baseline.  She was given 7.5 mg of albuterol , 0.5 mg of Atrovent, 1 g of intramuscular ceftriaxone , and a Kenalog injection.  EMS was called.  EMS gave further nebulized breathing as well as 125 mg of Solu-Medrol and 650 mg of oral Tylenol .  Patient reports that the breathing treatments have improved her symptoms.  She denies any current areas of discomfort.    Home Medications Prior to Admission medications   Medication Sig Start Date End Date Taking? Authorizing Provider  albuterol  (PROVENTIL ) (2.5 MG/3ML) 0.083% nebulizer solution USE 1 VIAL VIA NEBULIZER EVERY 6 HOURS AS NEEDED FOR WHEEZING OR SHORTNESS OF BREATH 12/31/22   Icard, Dariel Edelson L, DO  albuterol  (VENTOLIN  HFA) 108 (90 Base) MCG/ACT inhaler TAKE 2 PUFFS BY MOUTH EVERY 6 HOURS AS NEEDED FOR WHEEZE OR SHORTNESS OF BREATH 05/17/23   Maire Scot, MD  azithromycin  (ZITHROMAX ) 250 MG tablet Take 1 tablet (250 mg total) by mouth as directed. Patient not taking: Reported on 05/29/2023 03/29/23   Maire Scot, MD  Cholecalciferol (VITAMIN D PO) Take  1 tablet by mouth daily.    [provider]  doxycycline  (VIBRA -TABS) 100 MG tablet Take 1 tablet (100 mg total) by mouth 2 (two) times daily. Patient not taking: Reported on 05/29/2023 02/11/23   Maire Scot, MD  hydroxychloroquine (PLAQUENIL) 200 MG tablet Take by mouth.    [provider]  levothyroxine (SYNTHROID, LEVOTHROID) 112 MCG tablet Take 137 mcg by mouth daily.    [provider]  omeprazole (PRILOSEC) 20 MG capsule Take 20 mg by mouth daily. 07/08/17   [provider]  predniSONE  (DELTASONE ) 10 MG tablet 4 x 2 days, 2 x 2 days, 1 x 2 days, 1/2 x 2 days then stop Patient not taking: Reported on 05/29/2023 02/11/23   Maire Scot, MD  predniSONE  (DELTASONE ) 10 MG tablet 4 x 2 days, 2 x 2 days, 1 x 2 days, 1/2 x 2 days, then stop 03/29/23   Maire Scot, MD  promethazine -dextromethorphan (PROMETHAZINE -DM) 6.25-15 MG/5ML syrup Take 5 mLs by mouth 4 (four) times daily as needed for cough. 06/13/23   Antonio Baumgarten, NP  ramipril (ALTACE) 10 MG capsule Take 10 mg by mouth daily. 06/12/17   [provider]  sertraline (ZOLOFT) 50 MG tablet Take 50 mg by mouth daily.    [provider]  sodium chloride  HYPERTONIC 3 % nebulizer solution Take by nebulization 2 (two) times daily as needed for other or cough. 06/13/23   Antonio Baumgarten, NP  umeclidinium-vilanterol (  ANORO ELLIPTA ) 62.5-25 MCG/ACT AEPB INHALE 1 PUFF INTO LUNGS EVERY DAY 12/31/22   Icard, Lucie Ruts, DO      Allergies    Patient has no known allergies.    Review of Systems   Review of Systems  Respiratory:  Positive for cough, chest tightness, shortness of breath and wheezing.   All other systems reviewed and are negative.   Physical Exam Updated Vital Signs BP (!) 164/132 (BP Location: Right Arm)   Pulse (!) 103   Temp (!) 97.3 F (36.3 C) (Oral)   Resp (!) 24   Ht 5\' 3"  (1.6 m)   Wt 73.5 kg   SpO2 100%   BMI 28.70 kg/m  Physical Exam Vitals and  nursing note reviewed.  Constitutional:      General: She is not in acute distress.    Appearance: Normal appearance. She is well-developed. She is not ill-appearing, toxic-appearing or diaphoretic.  HENT:     Head: Normocephalic and atraumatic.     Right Ear: External ear normal.     Left Ear: External ear normal.     Nose: Nose normal.     Mouth/Throat:     Mouth: Mucous membranes are moist.  Eyes:     Extraocular Movements: Extraocular movements intact.     Conjunctiva/sclera: Conjunctivae normal.  Cardiovascular:     Rate and Rhythm: Normal rate and regular rhythm.     Heart sounds: No murmur heard. Pulmonary:     Effort: Pulmonary effort is normal. No respiratory distress.     Breath sounds: Wheezing present.  Abdominal:     General: There is no distension.     Palpations: Abdomen is soft.     Tenderness: There is no abdominal tenderness.  Musculoskeletal:        General: No swelling.     Cervical back: Normal range of motion and neck supple.  Skin:    General: Skin is warm and dry.     Coloration: Skin is not jaundiced or pale.  Neurological:     General: No focal deficit present.     Mental Status: She is alert and oriented to person, place, and time.  Psychiatric:        Mood and Affect: Mood normal.        Behavior: Behavior normal.     ED Results / Procedures / Treatments   Labs (all labs ordered are listed, but only abnormal results are displayed) Labs Reviewed  COMPREHENSIVE METABOLIC PANEL WITH GFR - Abnormal; Notable for the following components:      Result Value   Potassium 3.2 (*)    Glucose, Bld 216 (*)    Calcium 8.5 (*)    All other components within normal limits  BLOOD GAS, VENOUS - Abnormal; Notable for the following components:   pCO2, Ven 62 (*)    pO2, Ven <31 (*)    Bicarbonate 29.8 (*)    All other components within normal limits  CBC WITH DIFFERENTIAL/PLATELET - Abnormal; Notable for the following components:   Platelets 115 (*)     All other components within normal limits  RESP PANEL BY RT-PCR (RSV, FLU A&B, COVID)  [id number]  ACID FAST SMEAR (AFB, MYCOBACTERIA)  ACID FAST CULTURE WITH REFLEXED SENSITIVITIES (MYCOBACTERIA)  MAGNESIUM  BRAIN NATRIURETIC PEPTIDE    EKG None  Radiology DG Chest Port 1 View Result Date: 08/11/2023 EXAM: 1 VIEW XRAY OF THE CHEST 08/11/2023 12:30:00 PM COMPARISON: None available. CLINICAL HISTORY: Shortness of breath for  5 days. FINDINGS: LUNGS AND PLEURA: No focal pulmonary opacity. No pulmonary edema. No pleural effusion. No pneumothorax. HEART AND MEDIASTINUM: Borderline cardiac enlargement is exaggerated by low lung volumes. BONES AND SOFT TISSUES: No acute osseous abnormality. IMPRESSION: 1. No acute findings. 2. Borderline cardiac enlargement, exaggerated by low lung volumes. Electronically signed by: Audree Leas MD 08/11/2023 12:41 PM EDT RP Workstation: [id number]    Procedures Procedures    Medications Ordered in ED Medications  azithromycin  (ZITHROMAX ) 500 mg in sodium chloride  0.9 % 250 mL IVPB (500 mg Intravenous New Bag/Given 08/11/23 1152)  potassium chloride SA (KLOR-CON M) CR tablet 40 mEq (has no administration in time range)    ED Course/ Medical Decision Making/ A&P                                 Medical Decision Making Amount and/or Complexity of Data Reviewed Labs: ordered. Radiology: ordered.  Risk Prescription drug management. Decision regarding hospitalization.   This patient presents to the ED for concern of shortness of breath, this involves an extensive number of treatment options, and is a complaint that carries with it a high risk of complications and morbidity.  The differential diagnosis includes COPD exacerbation, pneumonia, CHF, anemia, acidosis   Co morbidities / Chronic conditions that complicate the patient evaluation  HTN, HLD, depression   Additional history obtained:  Additional history obtained from EMS External records  from outside source obtained and reviewed including EMR   Lab Tests:  I Ordered, and personally interpreted labs.  The pertinent results include: Hemoglobin, no leukocytosis, mild hypokalemia, mild hyperglycemia   Imaging Studies ordered:  I ordered imaging studies including chest x-ray I independently visualized and interpreted imaging which showed no acute findings I agree with the radiologist interpretation   Cardiac Monitoring: / EKG:  The patient was maintained on a cardiac monitor.  I personally viewed and interpreted the cardiac monitored which showed an underlying rhythm of: Sinuss rhythm   Problem List / ED Course / Critical interventions / Medication management  Patient presents for 5 days of worsening shortness of breath.  She does describe a productive cough.  At urgent care, she was found to have new onset hypoxia.  She was treated for reactive airway disease exacerbation.  EMS also reports that they obtained an x-ray which was concerning for upper lobe pneumonia.  Patient arrives on a breathing treatment.  She is able to speak in complete sentences.  She has ongoing diffuse expiratory wheezing on lung auscultation.  She does report improvement with treatments.  I reviewed paperwork from her urgent care visit.  She did receive 1 g of ceftriaxone  intramuscularly.  Will treat with azithromycin  as well.  Patient's lab work notable for mild hypokalemia.  Replacement potassium was ordered.  Per chart review, she recently had positive AFB on sputum.  I spoke with pulmonologist, Dr. Ardelle Kos, who recommends repeating while she is in the hospital.  Determination for long-term treatment will be made outpatient follow-up in July.  Following breathing treatment, patient had continued hypoxia with ambulation.  Patient to be admitted for further management. I ordered medication including azithromycin  for increased sputum production; potassium chloride for hypokalemia Reevaluation of the  patient after these medicines showed that the patient improved I have reviewed the patients home medicines and have made adjustments as needed   Consultations Obtained:  I requested consultation with the pulmonologist, Dr. Ardelle Kos,  and discussed  lab and imaging findings as well as pertinent plan - they recommend: Repeat AFB cultures while in the hospital.  Outpatient follow-up for determination of treatment.   Social Determinants of Health:  Lives independently  CRITICAL CARE Performed by: Iva Mariner   Total critical care time: 32 minutes  Critical care time was exclusive of separately billable procedures and treating other patients.  Critical care was necessary to treat or prevent imminent or life-threatening deterioration.  Critical care was time spent personally by me on the following activities: development of treatment plan with patient and/or surrogate as well as nursing, discussions with consultants, evaluation of patient's response to treatment, examination of patient, obtaining history from patient or surrogate, ordering and performing treatments and interventions, ordering and review of laboratory studies, ordering and review of radiographic studies, pulse oximetry and re-evaluation of patient's condition.        Final Clinical Impression(s) / ED Diagnoses Final diagnoses:  COPD exacerbation (HCC)  Acute respiratory failure with hypoxia [facility])    Rx / DC Orders ED Discharge Orders     None         Iva Mariner, MD 08/11/23 1248

## 2023-08-11 NOTE — ED Triage Notes (Signed)
 Pt. BIBEMS from urgent care w/ c/o SOB x 5 days. Pt. A&O x 4. Chest x-ray at urgent care showed pneumonia. O2 was initially 88% RA with expiratory wheezing. Pt. Given two IM ABT at urgent care. In route EMS gave albuterol , Atrovent, solumedrol, and tylenol . Hx COPD.  Per EMS: BP: 140/78 HR: 94 RR:24 SPO2: 98%via 6L neb T: 99.3

## 2023-08-12 DIAGNOSIS — K219 Gastro-esophageal reflux disease without esophagitis: Secondary | ICD-10-CM | POA: Diagnosis present

## 2023-08-12 DIAGNOSIS — J479 Bronchiectasis, uncomplicated: Secondary | ICD-10-CM | POA: Diagnosis not present

## 2023-08-12 DIAGNOSIS — F32A Depression, unspecified: Secondary | ICD-10-CM | POA: Diagnosis not present

## 2023-08-12 DIAGNOSIS — A310 Pulmonary mycobacterial infection: Secondary | ICD-10-CM

## 2023-08-12 DIAGNOSIS — J441 Chronic obstructive pulmonary disease with (acute) exacerbation: Secondary | ICD-10-CM | POA: Diagnosis not present

## 2023-08-12 DIAGNOSIS — E785 Hyperlipidemia, unspecified: Secondary | ICD-10-CM | POA: Diagnosis not present

## 2023-08-12 DIAGNOSIS — E039 Hypothyroidism, unspecified: Secondary | ICD-10-CM | POA: Diagnosis present

## 2023-08-12 LAB — BASIC METABOLIC PANEL WITH GFR
Anion gap: 10 (ref 5–15)
BUN: 17 mg/dL (ref 8–23)
CO2: 27 mmol/L (ref 22–32)
Calcium: 9 mg/dL (ref 8.9–10.3)
Chloride: 96 mmol/L — ABNORMAL LOW (ref 98–111)
Creatinine, Ser: 0.8 mg/dL (ref 0.44–1.00)
GFR, Estimated: >60 mL/min (ref >60)
Glucose, Bld: 137 mg/dL — ABNORMAL HIGH (ref 70–99)
Potassium: 5 mmol/L (ref 3.5–5.1)
Sodium: 133 mmol/L — ABNORMAL LOW (ref 135–145)

## 2023-08-12 LAB — CBC
HCT: 38.8 % (ref 36.0–46.0)
Hemoglobin: 12.3 g/dL (ref 12.0–15.0)
MCH: 29.5 pg (ref 26.0–34.0)
MCHC: 31.7 g/dL (ref 30.0–36.0)
MCV: 93 fL (ref 80.0–100.0)
Platelets: 141 10*3/uL — ABNORMAL LOW (ref 150–400)
RBC: 4.17 MIL/uL (ref 3.87–5.11)
RDW: 13.7 % (ref 11.5–15.5)
WBC: 6.1 10*3/uL (ref 4.0–10.5)
nRBC: 0 % (ref 0.0–0.2)

## 2023-08-12 MED ORDER — BUDESONIDE 0.5 MG/2ML IN SUSP
0.5000 mg | Freq: Two times a day (BID) | RESPIRATORY_TRACT | Status: DC
  Administered 2023-08-12 – 2023-08-16 (×9): 0.5 mg via RESPIRATORY_TRACT
  Filled 2023-08-12 (×9): qty 2

## 2023-08-12 MED ORDER — PANTOPRAZOLE SODIUM 40 MG PO TBEC
40.0000 mg | DELAYED_RELEASE_TABLET | Freq: Every day | ORAL | Status: DC
  Administered 2023-08-12 – 2023-08-16 (×5): 40 mg via ORAL
  Filled 2023-08-12 (×5): qty 1

## 2023-08-12 MED ORDER — SERTRALINE HCL 50 MG PO TABS
50.0000 mg | ORAL_TABLET | Freq: Every day | ORAL | Status: DC
  Administered 2023-08-12 – 2023-08-16 (×5): 50 mg via ORAL
  Filled 2023-08-12 (×5): qty 1

## 2023-08-12 MED ORDER — GUAIFENESIN ER 600 MG PO TB12
1200.0000 mg | ORAL_TABLET | Freq: Two times a day (BID) | ORAL | Status: DC
Start: 2023-08-12 — End: 2023-08-16
  Administered 2023-08-12 – 2023-08-16 (×9): 1200 mg via ORAL
  Filled 2023-08-12 (×9): qty 2

## 2023-08-12 MED ORDER — FLUTICASONE PROPIONATE 50 MCG/ACT NA SUSP
2.0000 | Freq: Every day | NASAL | Status: DC
  Administered 2023-08-12 – 2023-08-16 (×5): 2 via NASAL
  Filled 2023-08-12: qty 16

## 2023-08-12 MED ORDER — RAMIPRIL 5 MG PO CAPS
10.0000 mg | ORAL_CAPSULE | Freq: Every day | ORAL | Status: DC
  Administered 2023-08-12 – 2023-08-16 (×5): 10 mg via ORAL
  Filled 2023-08-12 (×5): qty 2

## 2023-08-12 MED ORDER — AMLODIPINE BESYLATE 5 MG PO TABS
5.0000 mg | ORAL_TABLET | Freq: Every day | ORAL | Status: DC
  Administered 2023-08-12 – 2023-08-13 (×2): 5 mg via ORAL
  Filled 2023-08-12 (×2): qty 1

## 2023-08-12 MED ORDER — HYDROCODONE BIT-HOMATROP MBR 5-1.5 MG/5ML PO SOLN
5.0000 mL | Freq: Four times a day (QID) | ORAL | Status: DC | PRN
  Administered 2023-08-14 – 2023-08-15 (×2): 5 mL via ORAL
  Filled 2023-08-12 (×2): qty 5

## 2023-08-12 MED ORDER — CYCLOSPORINE 0.05 % OP EMUL
1.0000 [drp] | Freq: Two times a day (BID) | OPHTHALMIC | Status: DC
  Administered 2023-08-12 – 2023-08-16 (×9): 1 [drp] via OPHTHALMIC
  Filled 2023-08-12 (×10): qty 30

## 2023-08-12 MED ORDER — ARFORMOTEROL TARTRATE 15 MCG/2ML IN NEBU
15.0000 ug | INHALATION_SOLUTION | Freq: Two times a day (BID) | RESPIRATORY_TRACT | Status: DC
  Administered 2023-08-12 – 2023-08-16 (×9): 15 ug via RESPIRATORY_TRACT
  Filled 2023-08-12 (×9): qty 2

## 2023-08-12 MED ORDER — HYDROXYCHLOROQUINE SULFATE 200 MG PO TABS
200.0000 mg | ORAL_TABLET | Freq: Two times a day (BID) | ORAL | Status: DC
  Administered 2023-08-12 – 2023-08-16 (×9): 200 mg via ORAL
  Filled 2023-08-12 (×10): qty 1

## 2023-08-12 MED ORDER — LORATADINE 10 MG PO TABS
10.0000 mg | ORAL_TABLET | Freq: Every day | ORAL | Status: DC
  Administered 2023-08-12 – 2023-08-16 (×5): 10 mg via ORAL
  Filled 2023-08-12 (×5): qty 1

## 2023-08-12 MED ORDER — LEVOTHYROXINE SODIUM 25 MCG PO TABS
137.0000 ug | ORAL_TABLET | Freq: Every day | ORAL | Status: DC
  Administered 2023-08-13 – 2023-08-16 (×4): 137 ug via ORAL
  Filled 2023-08-12 (×4): qty 1

## 2023-08-12 NOTE — TOC Initial Note (Signed)
 Transition of Care [facility]) - Initial/Assessment Note    Patient Details  Name: Aimee Brown MRN: [id number] Date of Birth: [birth year]  Transition of Care [facility]) CM/SW Contact:    Bari Leys, RN Phone Number: 08/12/2023, 9:49 AM  Clinical Narrative:    NCM called to patient's spouse, Aimee Brown, to introduce role to TOC/NCM and review for dc planning, patient has PCP on file, spouse reports no current home care services, has a home nebulizer, reports patient independent without AD, dc plan for patient to return home with spouse. TOC will continue to follow.                Expected Discharge Plan: Home/Self Care Barriers to Discharge: Continued Medical Work up   Patient Goals and CMS Choice Patient states their goals for this hospitalization and ongoing recovery are:: return home          Expected Discharge Plan and Services       Living arrangements for the past 2 months: Single Family Home                                      Prior Living Arrangements/Services Living arrangements for the past 2 months: Single Family Home Lives with:: Spouse Patient language and need for interpreter reviewed:: Yes Do you feel safe going back to the place where you live?: Yes      Need for Family Participation in Patient Care: Yes (Comment) Care giver support system in place?: Yes (comment) Current home services: DME (home nebulizer) Criminal Activity/Legal Involvement Pertinent to Current Situation/Hospitalization: No - Comment as needed  Activities of Daily Living   ADL Screening (condition at time of admission) Independently performs ADLs?: Yes (appropriate for developmental age) Is the patient deaf or have difficulty hearing?: No Does the patient have difficulty seeing, even when wearing glasses/contacts?: No Does the patient have difficulty concentrating, remembering, or making decisions?: No  Permission Sought/Granted                  Emotional Assessment          Alcohol  / Substance Use: Not Applicable Psych Involvement: No (comment)  Admission diagnosis:  COPD exacerbation (HCC) [J44.1] Acute respiratory failure with hypoxia (HCC) [J96.01] Patient Active Problem List   Diagnosis Date Noted   COPD exacerbation (HCC) 08/11/2023   Pseudophakia of both eyes 12/07/2020   Corneal scar, 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/07/2020   Long-term use of high-risk medication 03/07/2020   Posterior vitreous detachment of both eyes 03/07/2020   COPD (chronic obstructive pulmonary disease) (HCC) 12/18/2017   Bronchiectasis without complication (HCC) 12/18/2017   Atypical angina (HCC) 07/10/2017   Exertional dyspnea - as anginal equivalent 07/09/2017   Abnormal nuclear stress test 07/09/2017   Hypertensive heart disease without CHF 07/09/2017   Hyperlipidemia 07/09/2017   Obesity (BMI 30-39.9) 07/09/2017   PCP:  Suan Elm, MD Pharmacy:   [id number] - [place], [place] - [address] [place] [address] Phone: [phone] Fax: [phone]     Social Drivers of Health (SDOH) Social History: SDOH Screenings   Food Insecurity: No Food Insecurity (08/11/2023)  Housing: Low Risk  (08/11/2023)  Transportation Needs: No Transportation Needs (08/11/2023)  Utilities: Not At Risk (08/11/2023)  Social Connections: Moderately Integrated (08/11/2023)  Tobacco Use: Low Risk  (08/11/2023)   SDOH Interventions:     Readmission Risk Interventions    08/12/2023  9:48 AM  Readmission Risk Prevention Plan  Post Dischage Appt Complete  Medication Screening Complete  Transportation Screening Complete

## 2023-08-12 NOTE — Consult Note (Addendum)
 [facility]  Total days of antibiotics 2       Reason for Consult: pulmonary MAC/ COPD exacerbation   Referring Physician: thompson  Principal Problem:   COPD exacerbation (HCC) Active Problems:   Hypertensive heart disease without CHF   Hyperlipidemia    HPI: Aimee Brown is a 83 y.o. 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 She is being treated for COPD exacerbation with nebulizers, and empiric abtx for CAP. She is followed in the pulmonary clinic for her bronchiectasis and pulmonary NTM infection, she was slated to have more sputum collections since only isolated once thus far. She remains afebrile, on admit her WBC was 6.2 with 82%N. CXR does not show any infiltrates per my read. ID asked to weigh in on abtx, and work up for pulmonary NTM and isolation needs. The patient reports feeling better since admission. Still having some productive cough/phlegm this morning, but remains afebrile and less wheezing.  Past Medical History:  Diagnosis Date   Depression    Hypercholesteremia    Hypertension    Long-term use of high-risk medication 03/07/2020    Allergies: No Known Allergies    MEDICATIONS:  amLODipine  5 mg Oral Daily   arformoterol  15 mcg Nebulization BID   azithromycin   500 mg Oral Daily   budesonide (PULMICORT) nebulizer solution  0.5 mg Nebulization BID   cycloSPORINE  1 drop Both Eyes BID   enoxaparin (LOVENOX) injection  40 mg Subcutaneous QHS   fluticasone  2 spray Each Nare Daily   hydroxychloroquine  200 mg Oral BID   ipratropium-albuterol   3 mL Nebulization Q4H   [START ON 08/13/2023] levothyroxine  137 mcg Oral Q0600   loratadine  10 mg Oral Daily   methylPREDNISolone (SOLU-MEDROL) injection  81.25 mg Intravenous BID   pantoprazole  40 mg Oral Daily   ramipril  10 mg Oral Daily    sertraline  50 mg Oral Daily    Social History   Tobacco Use   Smoking status: Never   Smokeless tobacco: Never  Substance Use Topics   Alcohol  use: No   Drug use: Never  +smoker in the past  Family History  Problem Relation Age of Onset   Breast cancer Paternal Aunt    Breast cancer Maternal Grandmother    Heart disease Mother    Heart disease Father    Scleroderma Brother     Review of Systems - 12 point ros is negative except for what is mentioned above in hpi   OBJECTIVE: Temp:  [97.3 F (36.3 C)-99 F (37.2 C)] 98.5 F (36.9 C) (05/26 0931) Pulse Rate:  [75-103] 82 (05/26 0931) Resp:  [17-24] 17 (05/26 0931) BP: (158-176)/(65-132) 162/65 (05/26 0931) SpO2:  [88 %-100 %] 94 % (05/26 0931) Weight:  [73.5 kg] 73.5 kg (05/25 1118)  Physical Exam  Constitutional:  oriented to person, place, and time. appears well-developed and well-nourished. No distress.  HENT: [place]/AT, PERRLA, no scleral icterus Mouth/Throat: Oropharynx is clear and moist. No oropharyngeal exudate.  Cardiovascular: Normal rate, regular rhythm and normal heart sounds. Exam reveals no gallop and no friction rub.  No murmur heard.  Pulmonary/Chest: Effort normal and breath sounds normal. No respiratory distress.  has no wheezes. Crackles at left base Neck = supple, no nuchal rigidity Abdominal: Soft. Bowel sounds are normal.  exhibits  no distension. There is no tenderness.  Lymphadenopathy: no cervical adenopathy. No axillary adenopathy Neurological: alert and oriented to person, place, and time.  Skin: Skin is warm and dry. No rash noted. No erythema.  Psychiatric: a normal mood and affect.  behavior is normal.   LABS: Results for orders placed or performed during the hospital encounter of 08/11/23 (from the past 48 hours)  Resp panel by RT-PCR (RSV, Flu A&B, Covid) Anterior Nasal Swab     Status: None   Collection Time: 08/11/23 11:24 AM   Specimen: Anterior Nasal Swab  Result Value Ref Range    SARS Coronavirus 2 by RT PCR NEGATIVE NEGATIVE    Comment: (NOTE) SARS-CoV-2 target nucleic acids are NOT DETECTED.  The SARS-CoV-2 RNA is generally detectable in upper respiratory specimens during the acute phase of infection. The lowest concentration of SARS-CoV-2 viral copies this assay can detect is 138 copies/mL. A negative result does not preclude SARS-Cov-2 infection and should not be used as the sole basis for treatment or other patient management decisions. A negative result may occur with  improper specimen collection/handling, submission of specimen other than nasopharyngeal swab, presence of viral mutation(s) within the areas targeted by this assay, and inadequate number of viral copies(<138 copies/mL). A negative result must be combined with clinical observations, patient history, and epidemiological information. The expected result is Negative.  Fact Sheet for Patients:  BloggerCourse.com  Fact Sheet for Healthcare Providers:  SeriousBroker.it  This test is no t yet approved or cleared by the [place]  FDA and  has been authorized for detection and/or diagnosis of SARS-CoV-2 by FDA under an Emergency Use Authorization (EUA). This EUA will remain  in effect (meaning this test can be used) for the duration of the COVID-19 declaration under Section 564(b)(1) of the Act, 21 U.S.C.section 360bbb-3(b)(1), unless the authorization is terminated  or revoked sooner.       Influenza A by PCR NEGATIVE NEGATIVE   Influenza B by PCR NEGATIVE NEGATIVE    Comment: (NOTE) The Xpert Xpress SARS-CoV-2/FLU/RSV plus assay is intended as an aid in the diagnosis of influenza from Nasopharyngeal swab specimens and should not be used as a sole basis for treatment. Nasal washings and aspirates are unacceptable for Xpert Xpress SARS-CoV-2/FLU/RSV testing.  Fact Sheet for Patients: BloggerCourse.com  Fact  Sheet for Healthcare Providers: SeriousBroker.it  This test is not yet approved or cleared by the [place]  FDA and has been authorized for detection and/or diagnosis of SARS-CoV-2 by FDA under an Emergency Use Authorization (EUA). This EUA will remain in effect (meaning this test can be used) for the duration of the COVID-19 declaration under Section 564(b)(1) of the Act, 21 U.S.C. section 360bbb-3(b)(1), unless the authorization is terminated or revoked.     Resp Syncytial Virus by PCR NEGATIVE NEGATIVE    Comment: (NOTE) Fact Sheet for Patients: BloggerCourse.com  Fact Sheet for Healthcare Providers: SeriousBroker.it  This test is not yet approved or cleared by the [place]  FDA and has been authorized for detection and/or diagnosis of SARS-CoV-2 by FDA under an Emergency Use Authorization (EUA). This EUA will remain in effect (meaning this test can be used) for the duration of the COVID-19 declaration under Section 564(b)(1) of the Act, 21 U.S.C. section 360bbb-3(b)(1), unless the authorization is terminated or revoked.  Performed at [facility], [address] [place], [place] [address]   Comprehensive metabolic panel     Status: Abnormal   Collection Time: 08/11/23 11:42 AM  Result  Value Ref Range   Sodium 136 135 - 145 mmol/L   Potassium 3.2 (L) 3.5 - 5.1 mmol/L   Chloride 98 98 - 111 mmol/L   CO2 27 22 - 32 mmol/L   Glucose, Bld 216 (H) 70 - 99 mg/dL    Comment: Glucose reference range applies only to samples taken after fasting for at least 8 hours.   BUN 13 8 - 23 mg/dL   Creatinine, Ser 9.14 0.44 - 1.00 mg/dL   Calcium 8.5 (L) 8.9 - 10.3 mg/dL   Total Protein 6.5 6.5 - 8.1 g/dL   Albumin 3.6 3.5 - 5.0 g/dL   AST 25 15 - 41 U/L   ALT 17 0 - 44 U/L   Alkaline Phosphatase 64 38 - 126 U/L   Total Bilirubin 0.5 0.0 - 1.2 mg/dL   GFR, Estimated >78 >29 mL/min     Comment: (NOTE) Calculated using the CKD-EPI Creatinine Equation (2021)    Anion gap 11 5 - 15    Comment: Performed at [facility], [address], [place], [place] [address]  Blood gas, venous     Status: Abnormal   Collection Time: 08/11/23 11:42 AM  Result Value Ref Range   pH, Ven 7.29 7.25 - 7.43   pCO2, Ven 62 (H) 44 - 60 mmHg   pO2, Ven <31 (LL) 32 - 45 mmHg    Comment: CRITICAL RESULT CALLED TO, READ BACK BY AND VERIFIED WITH: HARRIS, S RN AT 1201 ON 08/11/23 BY Launa Police, K    Bicarbonate 29.8 (H) 20.0 - 28.0 mmol/L   Acid-Base Excess 1.6 0.0 - 2.0 mmol/L   O2 Saturation 42.6 %   Patient temperature 37.0     Comment: Performed at [facility], [address], [place], [place] [address]  CBC with Differential/Platelet     Status: Abnormal   Collection Time: 08/11/23 11:42 AM  Result Value Ref Range   WBC 6.2 4.0 - 10.5 K/uL   RBC 4.37 3.87 - 5.11 MIL/uL   Hemoglobin 12.8 12.0 - 15.0 g/dL   HCT 84.6 96.2 - 95.2 %   MCV 92.2 80.0 - 100.0 fL   MCH 29.3 26.0 - 34.0 pg   MCHC 31.8 30.0 - 36.0 g/dL   RDW 84.1 32.4 - 40.1 %   Platelets 115 (L) 150 - 400 K/uL   nRBC 0.0 0.0 - 0.2 %   Neutrophils Relative % 82 %   Neutro Abs 5.1 1.7 - 7.7 K/uL   Lymphocytes Relative 12 %   Lymphs Abs 0.8 0.7 - 4.0 K/uL   Monocytes Relative 6 %   Monocytes Absolute 0.4 0.1 - 1.0 K/uL   Eosinophils Relative 0 %   Eosinophils Absolute 0.0 0.0 - 0.5 K/uL   Basophils Relative 0 %   Basophils Absolute 0.0 0.0 - 0.1 K/uL   Immature Granulocytes 0 %   Abs Immature Granulocytes 0.02 0.00 - 0.07 K/uL    Comment: Performed at [facility], [address], [place], [place] [address]  Brain natriuretic peptide     Status: Abnormal   Collection Time: 08/11/23 11:42 AM  Result Value Ref Range   B Natriuretic Peptide 230.4 (H) 0.0 - 100.0 pg/mL    Comment: Performed at [facility], [address] [place], [place] [address]   Magnesium     Status: None   Collection Time: 08/11/23 11:42 AM  Result Value Ref Range   Magnesium 1.7 1.7 - 2.4  mg/dL    Comment: Performed at [facility], [address] [place], [place] [address]  Resp panel by RT-PCR (RSV, Flu A&B, Covid) Anterior Nasal Swab     Status: None   Collection Time: 08/11/23  3:45 PM   Specimen: Anterior Nasal Swab  Result Value Ref Range   SARS Coronavirus 2 by RT PCR NEGATIVE NEGATIVE    Comment: (NOTE) SARS-CoV-2 target nucleic acids are NOT DETECTED.  The SARS-CoV-2 RNA is generally detectable in upper respiratory specimens during the acute phase of infection. The lowest concentration of SARS-CoV-2 viral copies this assay can detect is 138 copies/mL. A negative result does not preclude SARS-Cov-2 infection and should not be used as the sole basis for treatment or other patient management decisions. A negative result may occur with  improper specimen collection/handling, submission of specimen other than nasopharyngeal swab, presence of viral mutation(s) within the areas targeted by this assay, and inadequate number of viral copies(<138 copies/mL). A negative result must be combined with clinical observations, patient history, and epidemiological information. The expected result is Negative.  Fact Sheet for Patients:  BloggerCourse.com  Fact Sheet for Healthcare Providers:  SeriousBroker.it  This test is no t yet approved or cleared by the [place]  FDA and  has been authorized for detection and/or diagnosis of SARS-CoV-2 by FDA under an Emergency Use Authorization (EUA). This EUA will remain  in effect (meaning this test can be used) for the duration of the COVID-19 declaration under Section 564(b)(1) of the Act, 21 U.S.C.section 360bbb-3(b)(1), unless the authorization is terminated  or revoked sooner.       Influenza A by PCR NEGATIVE NEGATIVE   Influenza B by  PCR NEGATIVE NEGATIVE    Comment: (NOTE) The Xpert Xpress SARS-CoV-2/FLU/RSV plus assay is intended as an aid in the diagnosis of influenza from Nasopharyngeal swab specimens and should not be used as a sole basis for treatment. Nasal washings and aspirates are unacceptable for Xpert Xpress SARS-CoV-2/FLU/RSV testing.  Fact Sheet for Patients: BloggerCourse.com  Fact Sheet for Healthcare Providers: SeriousBroker.it  This test is not yet approved or cleared by the [place]  FDA and has been authorized for detection and/or diagnosis of SARS-CoV-2 by FDA under an Emergency Use Authorization (EUA). This EUA will remain in effect (meaning this test can be used) for the duration of the COVID-19 declaration under Section 564(b)(1) of the Act, 21 U.S.C. section 360bbb-3(b)(1), unless the authorization is terminated or revoked.     Resp Syncytial Virus by PCR NEGATIVE NEGATIVE    Comment: (NOTE) Fact Sheet for Patients: BloggerCourse.com  Fact Sheet for Healthcare Providers: SeriousBroker.it  This test is not yet approved or cleared by the [place]  FDA and has been authorized for detection and/or diagnosis of SARS-CoV-2 by FDA under an Emergency Use Authorization (EUA). This EUA will remain in effect (meaning this test can be used) for the duration of the COVID-19 declaration under Section 564(b)(1) of the Act, 21 U.S.C. section 360bbb-3(b)(1), unless the authorization is terminated or revoked.  Performed at [facility], [address] [place], [place] [address]   MRSA Next Gen by PCR, Nasal     Status: None   Collection Time: 08/11/23  3:45 PM   Specimen: Nasal Mucosa; Nasal Swab  Result Value Ref Range   MRSA by PCR Next Gen NOT DETECTED NOT DETECTED    Comment: (NOTE) The GeneXpert MRSA Assay (FDA approved for NASAL specimens only), is one  component of a comprehensive MRSA  colonization surveillance program. It is not intended to diagnose MRSA infection nor to guide or monitor treatment for MRSA infections. Test performance is not FDA approved in patients less than 86 years old. Performed at [facility], [address] [place], [place] [address]   Basic metabolic panel     Status: Abnormal   Collection Time: 08/12/23  3:51 AM  Result Value Ref Range   Sodium 133 (L) 135 - 145 mmol/L   Potassium 5.0 3.5 - 5.1 mmol/L   Chloride 96 (L) 98 - 111 mmol/L   CO2 27 22 - 32 mmol/L   Glucose, Bld 137 (H) 70 - 99 mg/dL    Comment: Glucose reference range applies only to samples taken after fasting for at least 8 hours.   BUN 17 8 - 23 mg/dL   Creatinine, Ser 3.24 0.44 - 1.00 mg/dL   Calcium 9.0 8.9 - 40.1 mg/dL   GFR, Estimated >02 >72 mL/min    Comment: (NOTE) Calculated using the CKD-EPI Creatinine Equation (2021)    Anion gap 10 5 - 15    Comment: Performed at [facility], [address], [place], [place] [address]  CBC     Status: Abnormal   Collection Time: 08/12/23  3:51 AM  Result Value Ref Range   WBC 6.1 4.0 - 10.5 K/uL   RBC 4.17 3.87 - 5.11 MIL/uL   Hemoglobin 12.3 12.0 - 15.0 g/dL   HCT 40.3 47.4 - 25.9 %   MCV 93.0 80.0 - 100.0 fL   MCH 29.5 26.0 - 34.0 pg   MCHC 31.7 30.0 - 36.0 g/dL   RDW 56.3 87.5 - 64.3 %   Platelets 141 (L) 150 - 400 K/uL   nRBC 0.0 0.0 - 0.2 %    Comment: Performed at [facility], [address] [place], [place] [address]    MICRO: reviewed IMAGING: DG Chest Port 1 View Result Date: 08/11/2023 EXAM: 1 VIEW XRAY OF THE CHEST 08/11/2023 12:30:00 PM COMPARISON: None available. CLINICAL HISTORY: Shortness of breath for 5 days. FINDINGS: LUNGS AND PLEURA: No focal pulmonary opacity. No pulmonary edema. No pleural effusion. No pneumothorax. HEART AND MEDIASTINUM: Borderline cardiac enlargement is exaggerated by low lung volumes.  BONES AND SOFT TISSUES: No acute osseous abnormality. IMPRESSION: 1. No acute findings. 2. Borderline cardiac enlargement, exaggerated by low lung volumes. Electronically signed by: Audree Leas MD 08/11/2023 12:41 PM EDT RP Workstation: [id number]    HISTORICAL MICRO/IMAGING Chest CT from jan 2025:  IMPRESSION: 1. No evidence of fibrotic interstitial lung disease. 2. Mosaic pulmonary parenchymal attenuation can be seen in the setting of small airways disease. 3. Mild cylindrical bronchiectasis. 4. Aortic atherosclerosis (ICD10-I70.0). Coronary artery calcification. 5. Enlarged pulmonic trunk, indicative of pulmonary arterial Hypertension  Assessment/Plan:  83yo F with history of COPD. mild bronchiectasis, pulmonary MAC, who is admitted for COPD exacerbation - agree with steroids, and plan to transition to oral steroid taper of 40mg  daily x 2 days, 20mg  x 2 days, 10mg  x 2 days, then 5mg  x 2 days then stop  - in terms of abtx to treat COPD exacerbation, recommend 5 additional days (total of 7 days) with amox/calv 875mg  po bid. Can stop ceftriaxone  and azithromycin  after today. Will spare using azithromycin , since may need to use for treatment options in the future for pulmonary MAC  - bronchiectasis with pulmonary MAC is quite common. Often don't treat it with just one + specimen. Agree with pulmonary team's plan to  see if can isolate further in 1-2 additional specimens. Please give her AFB sputum cups to either collect here or at home upon discharge.   -for bronchiectasis = she will likely benefit to doing her flutter (acapella) valve apparatus home twice aday after her nebulizer so that it can help with clearing airways/respiratory hygiene. I have educated patient on direction/importance of airway clearance.  - no need for isolation since this is not mTB. Will discontinue airborne/contact isolation  evaluation of this patient requires complex antimicrobial therapy evaluation and  counseling and isolation needs for disease transmission risk assessment and mitigation.    Gerold Kos Levern Reader MD MPH [facility] [phone]

## 2023-08-12 NOTE — Progress Notes (Signed)
 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  Patient admitted for probable acute COPD exacerbation.  Sputum also ordered and obtained and pending.  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  Patient however per protocol placed in isolation as AFB was ordered and treated for COPD exacerbation.  ID consulted.   Assessment & Plan:   Principal Problem:   COPD exacerbation (HCC) Active Problems:   Hypertensive heart disease without CHF   Hyperlipidemia   Depression   GERD (gastroesophageal reflux disease)   Hypothyroidism  #1 acute COPD exacerbation/history of MAI -Patient presenting with 4 to 5-day history of worsening shortness of breath, worsening diffuse wheezing, productive cough of greenish sputum and noted to be hypoxic.  Patient not on O2 at baseline. - Chest x-ray done with no acute findings. - Patient with noted history of positive AFB and case discussed with pulmonary, Dr. Felipe Horton who advised that patient has a MAI and did not need isolation. - AFB was ordered and due to hospital protocol patient placed on isolation. - Patient with a history of documented MAI and does not need airborne isolation in discussion with ID. - Continue IV Solu-Medrol.  - Place on scheduled Pulmicort, Brovana, DuoNebs. - Discontinue azithromycin  and place on Augmentin to complete a 5 to 7-day course of treatment per ID recommendations. - Placed on Claritin, Flonase, PPI, Mucinex. -Patient seen in consultation by ID who are recommending  agreement with pulmonary's plan to see if can isolate further 1-2 additional specimens during this hospitalization or on discharge with close outpatient follow-up as already scheduled with pulmonary. -Placed on flutter valve, incentive spirometry. -Per ID no need for isolation and airborne/contact isolation has been discontinued per ID. - Outpatient follow-up with pulmonary.  2.  Hypertension -Resume home regimen of amlodipine, ramipril.  3.  Hypothyroidism -Resume home regimen Synthroid.  4.  GERD -PPI.  5.  Depression -Resume home regimen Zoloft.  6.  Hyperlipidemia -Not on any statins per med rec. - Outpatient follow-up with PCP.   DVT prophylaxis: Lovenox Code Status: DNR Family Communication: Updated patient and husband at bedside. Disposition: Likely home when clinically improved.  Status is: Inpatient Remains inpatient appropriate because: Severity of illness   Consultants:  Curb sided pulmonary ID: Dr. Levern Reader  Procedures:  Chest x-ray 08/11/2023   Antimicrobials:  Anti-infectives (From admission, onward)    Start     Dose/Rate Route Frequency Ordered Stop   08/12/23 1545  azithromycin  (ZITHROMAX ) tablet 500 mg  Status:  Discontinued       Placed in "Followed by" Linked Group   500 mg Oral Daily 08/11/23 1452 08/11/23 1506   08/12/23 1330  azithromycin  (ZITHROMAX ) tablet 500 mg  Status:  Discontinued       Placed in "Followed by" Linked Group   500 mg Oral Daily 08/11/23 1328 08/11/23 1334   08/12/23 1000  azithromycin  (ZITHROMAX ) tablet 500 mg        500 mg Oral Daily 08/11/23 1506 08/14/23 0959   08/12/23 1000  hydroxychloroquine (PLAQUENIL) tablet 200 mg  200 mg Oral 2 times daily 08/12/23 0828     08/11/23 1545  azithromycin  (ZITHROMAX ) 500 mg in sodium chloride  0.9 % 250 mL IVPB  Status:  Discontinued       Placed in "Followed by" Linked Group   500 mg 250 mL/hr over 60 Minutes Intravenous Every 24 hours 08/11/23 1452 08/11/23 1506   08/11/23  1330  azithromycin  (ZITHROMAX ) 500 mg in sodium chloride  0.9 % 250 mL IVPB  Status:  Discontinued       Placed in "Followed by" Linked Group   500 mg 250 mL/hr over 60 Minutes Intravenous Every 24 hours 08/11/23 1328 08/11/23 1334   08/11/23 1115  azithromycin  (ZITHROMAX ) 500 mg in sodium chloride  0.9 % 250 mL IVPB        500 mg 250 mL/hr over 60 Minutes Intravenous  Once 08/11/23 1111 08/11/23 1252         Subjective: Patient sitting up in chair.  Denies any chest pain.  Feels shortness of breath is improving as well as wheezing.  Still with productive cough.  Husband at bedside.  Objective: Vitals:   08/12/23 0931 08/12/23 1214 08/12/23 1426 08/12/23 1545  BP: (!) 162/65  (!) 143/64   Pulse: 82  85   Resp: 17  17   Temp: 98.5 F (36.9 C)  98.5 F (36.9 C)   TempSrc:      SpO2: 94% 94% 96% 93%  Weight:      Height:        Intake/Output Summary (Last 24 hours) at 08/12/2023 1720 Last data filed at 08/12/2023 1400 Gross per 24 hour  Intake 840 ml  Output --  Net 840 ml   Filed Weights   08/11/23 1118  Weight: 73.5 kg    Examination:  General exam: Appears calm and comfortable  Respiratory system: Diffuse expiratory wheezing.  No rhonchi, no crackles.  Fair air movement.  Speaking in full sentences.  No use of accessory muscles of respiration.   Cardiovascular system: S1 & S2 heard, RRR. No JVD, murmurs, rubs, gallops or clicks. No pedal edema. Gastrointestinal system: Abdomen is nondistended, soft and nontender. No organomegaly or masses felt. Normal bowel sounds heard. Central nervous system: Alert and oriented. No focal neurological deficits. Extremities: Symmetric 5 x 5 power. Skin: No rashes, lesions or ulcers Psychiatry: Judgement and insight appear normal. Mood & affect appropriate.     Data Reviewed: I have personally reviewed following labs and imaging studies  CBC: Recent Labs  Lab 08/11/23 1142 08/12/23 0351  WBC 6.2 6.1  NEUTROABS 5.1  --   HGB  12.8 12.3  HCT 40.3 38.8  MCV 92.2 93.0  PLT 115* 141*    Basic Metabolic Panel: Recent Labs  Lab 08/11/23 1142 08/12/23 0351  NA 136 133*  K 3.2* 5.0  CL 98 96*  CO2 27 27  GLUCOSE 216* 137*  BUN 13 17  CREATININE 0.78 0.80  CALCIUM 8.5* 9.0  MG 1.7  --     GFR: Estimated Creatinine Clearance: 52 mL/min (by C-G formula based on SCr of 0.8 mg/dL).  Liver Function Tests: Recent Labs  Lab 08/11/23 1142  AST 25  ALT 17  ALKPHOS 64  BILITOT 0.5  PROT 6.5  ALBUMIN 3.6    CBG: No results for input(s): "GLUCAP" in the last 168 hours.   Recent Results (from the past 240 hours)  Respiratory or Resp and Sputum Culture     Status: None   Collection Time: 08/09/23  9:38 AM  Result Value Ref Range Status   MICRO NUMBER: [id number]  Final   SPECIMEN QUALITY: Inadequate  Final   Source SPUTUM  Final   STATUS: FINAL  Final   GRAM STAIN:   Final    The sputum specimen submitted contains 25 or more squamous epithelial cells per low power field and is unacceptable for culture due to oropharyngeal contamination. Please resubmit another specimen if clinically indicated.   RESULT: CANCELED      Comment: Result canceled by the ancillary.  Resp panel by RT-PCR (RSV, Flu A&B, Covid) Anterior Nasal Swab     Status: None   Collection Time: 08/11/23 11:24 AM   Specimen: Anterior Nasal Swab  Result Value Ref Range Status   SARS Coronavirus 2 by RT PCR NEGATIVE NEGATIVE Final    Comment: (NOTE) SARS-CoV-2 target nucleic acids are NOT DETECTED.  The SARS-CoV-2 RNA is generally detectable in upper respiratory specimens during the acute phase of infection. The lowest concentration of SARS-CoV-2 viral copies this assay can detect is 138 copies/mL. A negative result does not preclude SARS-Cov-2 infection and should not be used as the sole basis for treatment or other patient management decisions. A negative result may occur with  improper specimen collection/handling, submission of  specimen other than nasopharyngeal swab, presence of viral mutation(s) within the areas targeted by this assay, and inadequate number of viral copies(<138 copies/mL). A negative result must be combined with clinical observations, patient history, and epidemiological information. The expected result is Negative.  Fact Sheet for Patients:  BloggerCourse.com  Fact Sheet for Healthcare Providers:  SeriousBroker.it  This test is no t yet approved or cleared by the [place]  FDA and  has been authorized for detection and/or diagnosis of SARS-CoV-2 by FDA under an Emergency Use Authorization (EUA). This EUA will remain  in effect (meaning this test can be used) for the duration of the COVID-19 declaration under Section 564(b)(1) of the Act, 21 U.S.C.section 360bbb-3(b)(1), unless the authorization is terminated  or revoked sooner.       Influenza A by PCR NEGATIVE NEGATIVE Final   Influenza B by PCR NEGATIVE NEGATIVE Final    Comment: (NOTE) The Xpert Xpress SARS-CoV-2/FLU/RSV plus assay is intended as an aid in the diagnosis of influenza from Nasopharyngeal swab specimens and should not be used as a sole basis for treatment. Nasal washings and aspirates are unacceptable for Xpert Xpress SARS-CoV-2/FLU/RSV testing.  Fact Sheet for Patients: BloggerCourse.com  Fact Sheet for Healthcare Providers: SeriousBroker.it  This test is not yet approved or cleared by the [place]  FDA and has been authorized for detection and/or diagnosis of SARS-CoV-2 by FDA under an Emergency Use Authorization (EUA). This EUA will remain in effect (meaning this test can be used) for the duration of the COVID-19 declaration under Section 564(b)(1) of the Act, 21 U.S.C. section 360bbb-3(b)(1), unless the authorization is terminated or revoked.     Resp Syncytial Virus by PCR NEGATIVE NEGATIVE Final     Comment: (NOTE) Fact Sheet for Patients: BloggerCourse.com  Fact Sheet for Healthcare Providers: SeriousBroker.it  This test is not yet approved or cleared by the [place]  FDA and has been authorized for detection and/or diagnosis of SARS-CoV-2 by FDA under an Emergency Use Authorization (EUA). This EUA will remain in effect (meaning this test can be used) for the duration of the COVID-19 declaration under Section 564(b)(1) of the Act, 21 U.S.C. section 360bbb-3(b)(1), unless the authorization is terminated or revoked.  Performed at [facility], [address], [place], [place] [address]   Resp panel by RT-PCR (RSV, Flu A&B, Covid) Anterior Nasal Swab     Status: None   Collection Time: 08/11/23  3:45 PM   Specimen: Anterior Nasal Swab  Result Value Ref Range Status   SARS Coronavirus 2 by RT PCR NEGATIVE NEGATIVE Final    Comment: (NOTE) SARS-CoV-2 target nucleic acids are NOT DETECTED.  The SARS-CoV-2 RNA is generally detectable in upper respiratory specimens during the acute phase of infection. The lowest concentration of SARS-CoV-2 viral copies this assay can detect is 138 copies/mL. A negative result does not preclude SARS-Cov-2 infection and should not be used as the sole basis for treatment or other patient management decisions. A negative result may occur with  improper specimen collection/handling, submission of specimen other than nasopharyngeal swab, presence of viral mutation(s) within the areas targeted by this assay, and inadequate number of viral copies(<138 copies/mL). A negative result must be combined with clinical observations, patient history, and epidemiological information. The expected result is Negative.  Fact Sheet for Patients:  BloggerCourse.com  Fact Sheet for Healthcare Providers:  SeriousBroker.it  This test is no t  yet approved or cleared by the [place]  FDA and  has been authorized for detection and/or diagnosis of SARS-CoV-2 by FDA under an Emergency Use Authorization (EUA). This EUA will remain  in effect (meaning this test can be used) for the duration of the COVID-19 declaration under Section 564(b)(1) of the Act, 21 U.S.C.section 360bbb-3(b)(1), unless the authorization is terminated  or revoked sooner.       Influenza A by PCR NEGATIVE NEGATIVE Final   Influenza B by PCR NEGATIVE NEGATIVE Final    Comment: (NOTE) The Xpert Xpress SARS-CoV-2/FLU/RSV plus assay is intended as an aid in the diagnosis of influenza from Nasopharyngeal swab specimens and should not be used as a sole basis for treatment. Nasal washings and aspirates are unacceptable for Xpert Xpress SARS-CoV-2/FLU/RSV testing.  Fact Sheet for Patients: BloggerCourse.com  Fact Sheet for Healthcare Providers: SeriousBroker.it  This test is not yet approved or cleared by the [place]  FDA and has been authorized for detection and/or diagnosis of SARS-CoV-2 by FDA under an Emergency Use Authorization (EUA). This EUA will remain in effect (meaning this test can be used) for the duration of the COVID-19 declaration under Section 564(b)(1) of the Act, 21 U.S.C. section 360bbb-3(b)(1), unless the authorization is terminated or revoked.     Resp Syncytial Virus by PCR NEGATIVE NEGATIVE Final    Comment: (NOTE) Fact Sheet for Patients: BloggerCourse.com  Fact Sheet for Healthcare Providers: SeriousBroker.it  This test is not yet approved or cleared by the [place]  FDA and has been authorized for detection and/or diagnosis of SARS-CoV-2 by FDA under an Emergency Use Authorization (EUA). This EUA will remain in effect (meaning this test can be used) for the duration of the COVID-19 declaration under Section 564(b)(1)  of the Act, 21 U.S.C. section 360bbb-3(b)(1), unless the authorization is terminated or revoked.  Performed at [facility], [address], [place], [place] [address]   MRSA Next Gen by PCR, Nasal     Status: None   Collection Time: 08/11/23  3:45 PM   Specimen: Nasal Mucosa; Nasal Swab  Result Value Ref Range Status   MRSA by PCR Next Gen NOT DETECTED NOT DETECTED Final    Comment: (NOTE) The GeneXpert MRSA Assay (FDA approved for NASAL specimens only), is one component of a comprehensive MRSA colonization  surveillance program. It is not intended to diagnose MRSA infection nor to guide or monitor treatment for MRSA infections. Test performance is not FDA approved in patients less than 4 years old. Performed at [facility], [address] [place], [place] [address]          Radiology Studies: [facility] Chest Port 1 View Result Date: 08/11/2023 EXAM: 1 VIEW XRAY OF THE CHEST 08/11/2023 12:30:00 PM COMPARISON: None available. CLINICAL HISTORY: Shortness of breath for 5 days. FINDINGS: LUNGS AND PLEURA: No focal pulmonary opacity. No pulmonary edema. No pleural effusion. No pneumothorax. HEART AND MEDIASTINUM: Borderline cardiac enlargement is exaggerated by low lung volumes. BONES AND SOFT TISSUES: No acute osseous abnormality. IMPRESSION: 1. No acute findings. 2. Borderline cardiac enlargement, exaggerated by low lung volumes. Electronically signed by: Audree Leas MD 08/11/2023 12:41 PM EDT RP Workstation: [id number]        Scheduled Meds:  amLODipine  5 mg Oral Daily   arformoterol  15 mcg Nebulization BID   azithromycin   500 mg Oral Daily   budesonide (PULMICORT) nebulizer solution  0.5 mg Nebulization BID   cycloSPORINE  1 drop Both Eyes BID   enoxaparin (LOVENOX) injection  40 mg Subcutaneous QHS   fluticasone  2 spray Each Nare Daily   guaiFENesin  1,200 mg Oral BID   hydroxychloroquine  200 mg Oral BID    ipratropium-albuterol   3 mL Nebulization Q4H   [START ON 08/13/2023] levothyroxine  137 mcg Oral Q0600   loratadine  10 mg Oral Daily   methylPREDNISolone (SOLU-MEDROL) injection  81.25 mg Intravenous BID   pantoprazole  40 mg Oral Daily   ramipril  10 mg Oral Daily   sertraline  50 mg Oral Daily   Continuous Infusions:   LOS: 1 day    Time spent: 40 minutes    Hilda Lovings, MD [facility]   To contact the attending provider between 7A-7P or the covering provider during after hours 7P-7A, please log into the web site [email or web address] and access using universal [place] password for that web site. If you do not have the password, please call the hospital operator.  08/12/2023, 5:20 PM

## 2023-08-12 NOTE — Plan of Care (Signed)

## 2023-08-12 NOTE — Plan of Care (Signed)
  Problem: Activity: Goal: Risk for activity intolerance will decrease Outcome: Progressing   Problem: Nutrition: Goal: Adequate nutrition will be maintained Outcome: Progressing   Problem: Pain Managment: Goal: General experience of comfort will improve and/or be controlled Outcome: Progressing   Problem: Safety: Goal: Ability to remain free from injury will improve Outcome: Progressing   Problem: Respiratory: Goal: Levels of oxygenation will improve Outcome: Progressing

## 2023-08-13 ENCOUNTER — Encounter (HOSPITAL_COMMUNITY): Payer: Self-pay | Admitting: Hospitalist

## 2023-08-13 DIAGNOSIS — E871 Hypo-osmolality and hyponatremia: Secondary | ICD-10-CM | POA: Insufficient documentation

## 2023-08-13 DIAGNOSIS — E785 Hyperlipidemia, unspecified: Secondary | ICD-10-CM | POA: Diagnosis not present

## 2023-08-13 DIAGNOSIS — K219 Gastro-esophageal reflux disease without esophagitis: Secondary | ICD-10-CM | POA: Diagnosis not present

## 2023-08-13 DIAGNOSIS — J441 Chronic obstructive pulmonary disease with (acute) exacerbation: Secondary | ICD-10-CM | POA: Diagnosis not present

## 2023-08-13 DIAGNOSIS — F32A Depression, unspecified: Secondary | ICD-10-CM | POA: Diagnosis not present

## 2023-08-13 DIAGNOSIS — J9601 Acute respiratory failure with hypoxia: Secondary | ICD-10-CM

## 2023-08-13 LAB — SODIUM, URINE, RANDOM: Sodium, Ur: 61 mmol/L

## 2023-08-13 LAB — BASIC METABOLIC PANEL WITH GFR
Anion gap: 9 (ref 5–15)
BUN: 20 mg/dL (ref 8–23)
CO2: 25 mmol/L (ref 22–32)
Calcium: 8.4 mg/dL — ABNORMAL LOW (ref 8.9–10.3)
Chloride: 93 mmol/L — ABNORMAL LOW (ref 98–111)
Creatinine, Ser: 0.72 mg/dL (ref 0.44–1.00)
GFR, Estimated: >60 mL/min (ref >60)
Glucose, Bld: 129 mg/dL — ABNORMAL HIGH (ref 70–99)
Potassium: 4.4 mmol/L (ref 3.5–5.1)
Sodium: 127 mmol/L — ABNORMAL LOW (ref 135–145)

## 2023-08-13 LAB — URINALYSIS, COMPLETE (UACMP) WITH MICROSCOPIC
Bacteria, UA: NONE SEEN
Bilirubin Urine: NEGATIVE
Glucose, UA: NEGATIVE mg/dL
Hgb urine dipstick: NEGATIVE
Ketones, ur: NEGATIVE mg/dL
Leukocytes,Ua: NEGATIVE
Nitrite: NEGATIVE
Protein, ur: NEGATIVE mg/dL
Specific Gravity, Urine: 1.019 (ref 1.005–1.030)
pH: 5 (ref 5.0–8.0)

## 2023-08-13 LAB — CBC
HCT: 38.6 % (ref 36.0–46.0)
Hemoglobin: 12.4 g/dL (ref 12.0–15.0)
MCH: 29.2 pg (ref 26.0–34.0)
MCHC: 32.1 g/dL (ref 30.0–36.0)
MCV: 90.8 fL (ref 80.0–100.0)
Platelets: 156 10*3/uL (ref 150–400)
RBC: 4.25 MIL/uL (ref 3.87–5.11)
RDW: 13.7 % (ref 11.5–15.5)
WBC: 8.8 10*3/uL (ref 4.0–10.5)
nRBC: 0 % (ref 0.0–0.2)

## 2023-08-13 LAB — MAGNESIUM: Magnesium: 1.9 mg/dL (ref 1.7–2.4)

## 2023-08-13 LAB — OSMOLALITY: Osmolality: 290 mosm/kg (ref 275–295)

## 2023-08-13 LAB — BRAIN NATRIURETIC PEPTIDE: B Natriuretic Peptide: 166.3 pg/mL — ABNORMAL HIGH (ref 0.0–100.0)

## 2023-08-13 LAB — TSH: TSH: 1.102 u[IU]/mL (ref 0.350–4.500)

## 2023-08-13 LAB — OSMOLALITY, URINE: Osmolality, Ur: 571 mosm/kg (ref 300–900)

## 2023-08-13 MED ORDER — AMOXICILLIN-POT CLAVULANATE 875-125 MG PO TABS
1.0000 | ORAL_TABLET | Freq: Two times a day (BID) | ORAL | Status: DC
  Administered 2023-08-13 – 2023-08-16 (×7): 1 via ORAL
  Filled 2023-08-13 (×7): qty 1

## 2023-08-13 MED ORDER — PREDNISONE 20 MG PO TABS
60.0000 mg | ORAL_TABLET | Freq: Every day | ORAL | Status: AC
  Administered 2023-08-14 – 2023-08-16 (×3): 60 mg via ORAL
  Filled 2023-08-13 (×3): qty 3

## 2023-08-13 MED ORDER — IPRATROPIUM-ALBUTEROL 0.5-2.5 (3) MG/3ML IN SOLN
3.0000 mL | Freq: Three times a day (TID) | RESPIRATORY_TRACT | Status: DC
  Administered 2023-08-14 – 2023-08-16 (×8): 3 mL via RESPIRATORY_TRACT
  Filled 2023-08-13 (×8): qty 3

## 2023-08-13 MED ORDER — POLYETHYLENE GLYCOL 3350 17 G PO PACK
17.0000 g | PACK | Freq: Two times a day (BID) | ORAL | Status: DC
  Administered 2023-08-14: 17 g via ORAL
  Filled 2023-08-13 (×6): qty 1

## 2023-08-13 MED ORDER — AMLODIPINE BESYLATE 10 MG PO TABS
10.0000 mg | ORAL_TABLET | Freq: Every day | ORAL | Status: DC
  Administered 2023-08-14 – 2023-08-16 (×3): 10 mg via ORAL
  Filled 2023-08-13 (×3): qty 1

## 2023-08-13 MED ORDER — SENNOSIDES-DOCUSATE SODIUM 8.6-50 MG PO TABS
1.0000 | ORAL_TABLET | Freq: Two times a day (BID) | ORAL | Status: DC
  Administered 2023-08-14 – 2023-08-15 (×2): 1 via ORAL
  Filled 2023-08-13 (×6): qty 1

## 2023-08-13 MED ORDER — FUROSEMIDE 10 MG/ML IJ SOLN
20.0000 mg | Freq: Once | INTRAMUSCULAR | Status: AC
  Administered 2023-08-13: 20 mg via INTRAVENOUS
  Filled 2023-08-13: qty 2

## 2023-08-13 MED ORDER — METHYLPREDNISOLONE SODIUM SUCC 125 MG IJ SOLR
81.2500 mg | Freq: Two times a day (BID) | INTRAMUSCULAR | Status: AC
  Administered 2023-08-13: 81.25 mg via INTRAVENOUS
  Filled 2023-08-13: qty 2

## 2023-08-13 MED ORDER — AMLODIPINE BESYLATE 5 MG PO TABS
5.0000 mg | ORAL_TABLET | Freq: Once | ORAL | Status: AC
  Administered 2023-08-13: 5 mg via ORAL
  Filled 2023-08-13: qty 1

## 2023-08-13 NOTE — Progress Notes (Addendum)
 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  Patient admitted for probable acute COPD exacerbation.  Sputum also ordered and obtained and pending.  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  Patient however per protocol placed in isolation as AFB was ordered and treated for COPD exacerbation.  ID consulted.   Assessment & Plan:   Principal Problem:   Acute respiratory failure with hypoxia (HCC) Active Problems:   COPD exacerbation (HCC)   Hypertensive heart disease without CHF   Hyperlipidemia   Depression   GERD (gastroesophageal reflux disease)   Hypothyroidism   Hyponatremia  #1  Acute respiratory failure with hypoxia secondary to acute COPD exacerbation/history of MAI -Patient presented with 4 to 5-day history of worsening shortness of breath, worsening diffuse wheezing, productive cough of greenish sputum and noted to be hypoxic.  Patient not on O2 at baseline. - Chest x-ray done with no acute findings. - Patient with noted history of positive AFB and case discussed with pulmonary, Dr. Felipe Horton who advised that patient has a MAI and did not need isolation. - AFB was ordered and due to hospital protocol patient placed on isolation. - Patient with a history of documented MAI and does not need airborne isolation in discussion with ID. - Continue IV Solu-Medrol through today and transition to prednisone  60 mg tomorrow with slow taper.  - Continue scheduled Pulmicort, Brovana, DuoNebs. - Discontinue azithromycin  and place  on Augmentin to complete a 5 to 7-day course of treatment per ID recommendations. - Continue Claritin, Flonase, PPI, Mucinex. -Patient seen in consultation by ID who are in agreement with pulmonary's plan to see if can isolate further 1-2 additional specimens during this hospitalization or on discharge with close outpatient follow-up as already scheduled with pulmonary. - Continue flutter valve, incentive spirometry. -Check a 2D echo. -Per ID no need for isolation and airborne/contact isolation has been discontinued per ID. -Check ambulatory sats. - Outpatient follow-up with pulmonary.  2.  Hypertension - BP elevated this morning.   - Increase Norvasc to 10 mg daily. - Continue current dose of ramipril and uptitrate as needed for better blood pressure control.  3.  Hyponatremia - ??  Etiology - Check a urinalysis, urine sodium, urine creatinine, urine and serum osmolality, TSH at 1.102. - Lasix 20 mg IV x 1. - Repeat labs in AM.  4.  Hypothyroidism - Synthroid.  5.  GERD - Continue PPI.  6.  Depression - Continue home regimen Zoloft.   7.  Hyperlipidemia -Not on any statins per med rec. - Outpatient follow-up with PCP.   DVT prophylaxis: Lovenox Code Status: DNR Family Communication: Updated patient and husband at bedside. Disposition: Likely home when clinically improved and close to baseline hopefully in the next 1 to 2 days..  Status is: Inpatient Remains inpatient appropriate because: Severity of illness   Consultants:  Curb sided pulmonary ID: Dr. Levern Reader  Procedures:  Chest x-ray 08/11/2023   Antimicrobials:  Anti-infectives (From admission, onward)    Start     Dose/Rate Route Frequency Ordered Stop   08/13/23 1000  amoxicillin-clavulanate (  AUGMENTIN) 875-125 MG per tablet 1 tablet        1 tablet Oral Every 12 hours 08/13/23 0813 08/18/23 0959   08/12/23 1545  azithromycin  (ZITHROMAX ) tablet 500 mg  Status:  Discontinued       Placed in "Followed by"  Linked Group   500 mg Oral Daily 08/11/23 1452 08/11/23 1506   08/12/23 1330  azithromycin  (ZITHROMAX ) tablet 500 mg  Status:  Discontinued       Placed in "Followed by" Linked Group   500 mg Oral Daily 08/11/23 1328 08/11/23 1334   08/12/23 1000  azithromycin  (ZITHROMAX ) tablet 500 mg        500 mg Oral Daily 08/11/23 1506 08/13/23 0800   08/12/23 1000  hydroxychloroquine (PLAQUENIL) tablet 200 mg        200 mg Oral 2 times daily 08/12/23 0828     08/11/23 1545  azithromycin  (ZITHROMAX ) 500 mg in sodium chloride  0.9 % 250 mL IVPB  Status:  Discontinued       Placed in "Followed by" Linked Group   500 mg 250 mL/hr over 60 Minutes Intravenous Every 24 hours 08/11/23 1452 08/11/23 1506   08/11/23 1330  azithromycin  (ZITHROMAX ) 500 mg in sodium chloride  0.9 % 250 mL IVPB  Status:  Discontinued       Placed in "Followed by" Linked Group   500 mg 250 mL/hr over 60 Minutes Intravenous Every 24 hours 08/11/23 1328 08/11/23 1334   08/11/23 1115  azithromycin  (ZITHROMAX ) 500 mg in sodium chloride  0.9 % 250 mL IVPB        500 mg 250 mL/hr over 60 Minutes Intravenous  Once 08/11/23 1111 08/11/23 1252         Subjective: Sitting up in bed.  Complains of shortness of breath on ambulation to the bathroom.  States some improvement with shortness of breath on admission as well as wheezing.  Patient states was not on home O2 prior to admission.  Husband at bedside.   Objective: Vitals:   08/13/23 1630 08/13/23 1739 08/13/23 1745 08/13/23 1753  BP:      Pulse:      Resp:      Temp:      TempSrc:      SpO2: 92% 90% (!) 88% (!) 86%  Weight:      Height:        Intake/Output Summary (Last 24 hours) at 08/13/2023 1833 Last data filed at 08/13/2023 1309 Gross per 24 hour  Intake 1050 ml  Output 300 ml  Net 750 ml   Filed Weights   08/11/23 1118 08/13/23 0811  Weight: 73.5 kg 73.7 kg    Examination:  General exam: Appears calm and comfortable  Respiratory system: Improved expiratory  wheezing.  Some scattered coarse breath sounds.  No rhonchi.  No significant crackles noted.  Fair air movement.  Speaking in full sentences.   Cardiovascular system: RRR no murmurs rubs or gallops.  No JVD.  No pitting lower extremity edema.  Gastrointestinal system: Abdomen is soft, nontender, nondistended, positive bowel sounds.  No rebound.  No guarding.  Central nervous system: Alert and oriented. No focal neurological deficits. Extremities: Symmetric 5 x 5 power. Skin: No rashes, lesions or ulcers Psychiatry: Judgement and insight appear normal. Mood & affect appropriate.     Data Reviewed: I have personally reviewed following labs and imaging studies  CBC: Recent Labs  Lab 08/11/23 1142 08/12/23 0351 08/13/23 0329  WBC 6.2 6.1 8.8  NEUTROABS 5.1  --   --  HGB 12.8 12.3 12.4  HCT 40.3 38.8 38.6  MCV 92.2 93.0 90.8  PLT 115* 141* 156    Basic Metabolic Panel: Recent Labs  Lab 08/11/23 1142 08/12/23 0351 08/13/23 0329  NA 136 133* 127*  K 3.2* 5.0 4.4  CL 98 96* 93*  CO2 27 27 25   GLUCOSE 216* 137* 129*  BUN 13 17 20   CREATININE 0.78 0.80 0.72  CALCIUM 8.5* 9.0 8.4*  MG 1.7  --  1.9    GFR: Estimated Creatinine Clearance: 52.1 mL/min (by C-G formula based on SCr of 0.72 mg/dL).  Liver Function Tests: Recent Labs  Lab 08/11/23 1142  AST 25  ALT 17  ALKPHOS 64  BILITOT 0.5  PROT 6.5  ALBUMIN 3.6    CBG: No results for input(s): "GLUCAP" in the last 168 hours.   Recent Results (from the past 240 hours)  Respiratory or Resp and Sputum Culture     Status: None   Collection Time: 08/09/23  9:38 AM  Result Value Ref Range Status   MICRO NUMBER: [id number]  Final   SPECIMEN QUALITY: Inadequate  Final   Source SPUTUM  Final   STATUS: FINAL  Final   GRAM STAIN:   Final    The sputum specimen submitted contains 25 or more squamous epithelial cells per low power field and is unacceptable for culture due to oropharyngeal contamination. Please resubmit  another specimen if clinically indicated.   RESULT: CANCELED      Comment: Result canceled by the ancillary.  Resp panel by RT-PCR (RSV, Flu A&B, Covid) Anterior Nasal Swab     Status: None   Collection Time: 08/11/23 11:24 AM   Specimen: Anterior Nasal Swab  Result Value Ref Range Status   SARS Coronavirus 2 by RT PCR NEGATIVE NEGATIVE Final    Comment: (NOTE) SARS-CoV-2 target nucleic acids are NOT DETECTED.  The SARS-CoV-2 RNA is generally detectable in upper respiratory specimens during the acute phase of infection. The lowest concentration of SARS-CoV-2 viral copies this assay can detect is 138 copies/mL. A negative result does not preclude SARS-Cov-2 infection and should not be used as the sole basis for treatment or other patient management decisions. A negative result may occur with  improper specimen collection/handling, submission of specimen other than nasopharyngeal swab, presence of viral mutation(s) within the areas targeted by this assay, and inadequate number of viral copies(<138 copies/mL). A negative result must be combined with clinical observations, patient history, and epidemiological information. The expected result is Negative.  Fact Sheet for Patients:  BloggerCourse.com  Fact Sheet for Healthcare Providers:  SeriousBroker.it  This test is no t yet approved or cleared by the [place]  FDA and  has been authorized for detection and/or diagnosis of SARS-CoV-2 by FDA under an Emergency Use Authorization (EUA). This EUA will remain  in effect (meaning this test can be used) for the duration of the COVID-19 declaration under Section 564(b)(1) of the Act, 21 U.S.C.section 360bbb-3(b)(1), unless the authorization is terminated  or revoked sooner.       Influenza A by PCR NEGATIVE NEGATIVE Final   Influenza B by PCR NEGATIVE NEGATIVE Final    Comment: (NOTE) The Xpert Xpress SARS-CoV-2/FLU/RSV plus assay  is intended as an aid in the diagnosis of influenza from Nasopharyngeal swab specimens and should not be used as a sole basis for treatment. Nasal washings and aspirates are unacceptable for Xpert Xpress SARS-CoV-2/FLU/RSV testing.  Fact Sheet for Patients: BloggerCourse.com  Fact Sheet for Healthcare Providers: SeriousBroker.it  This test is not yet approved or cleared by the [place]  FDA and has been authorized for detection and/or diagnosis of SARS-CoV-2 by FDA under an Emergency Use Authorization (EUA). This EUA will remain in effect (meaning this test can be used) for the duration of the COVID-19 declaration under Section 564(b)(1) of the Act, 21 U.S.C. section 360bbb-3(b)(1), unless the authorization is terminated or revoked.     Resp Syncytial Virus by PCR NEGATIVE NEGATIVE Final    Comment: (NOTE) Fact Sheet for Patients: BloggerCourse.com  Fact Sheet for Healthcare Providers: SeriousBroker.it  This test is not yet approved or cleared by the [place]  FDA and has been authorized for detection and/or diagnosis of SARS-CoV-2 by FDA under an Emergency Use Authorization (EUA). This EUA will remain in effect (meaning this test can be used) for the duration of the COVID-19 declaration under Section 564(b)(1) of the Act, 21 U.S.C. section 360bbb-3(b)(1), unless the authorization is terminated or revoked.  Performed at [facility], [address] [place], [place] [address]   Resp panel by RT-PCR (RSV, Flu A&B, Covid) Anterior Nasal Swab     Status: None   Collection Time: 08/11/23  3:45 PM   Specimen: Anterior Nasal Swab  Result Value Ref Range Status   SARS Coronavirus 2 by RT PCR NEGATIVE NEGATIVE Final    Comment: (NOTE) SARS-CoV-2 target nucleic acids are NOT DETECTED.  The SARS-CoV-2 RNA is generally detectable in upper  respiratory specimens during the acute phase of infection. The lowest concentration of SARS-CoV-2 viral copies this assay can detect is 138 copies/mL. A negative result does not preclude SARS-Cov-2 infection and should not be used as the sole basis for treatment or other patient management decisions. A negative result may occur with  improper specimen collection/handling, submission of specimen other than nasopharyngeal swab, presence of viral mutation(s) within the areas targeted by this assay, and inadequate number of viral copies(<138 copies/mL). A negative result must be combined with clinical observations, patient history, and epidemiological information. The expected result is Negative.  Fact Sheet for Patients:  BloggerCourse.com  Fact Sheet for Healthcare Providers:  SeriousBroker.it  This test is no t yet approved or cleared by the [place]  FDA and  has been authorized for detection and/or diagnosis of SARS-CoV-2 by FDA under an Emergency Use Authorization (EUA). This EUA will remain  in effect (meaning this test can be used) for the duration of the COVID-19 declaration under Section 564(b)(1) of the Act, 21 U.S.C.section 360bbb-3(b)(1), unless the authorization is terminated  or revoked sooner.       Influenza A by PCR NEGATIVE NEGATIVE Final   Influenza B by PCR NEGATIVE NEGATIVE Final    Comment: (NOTE) The Xpert Xpress SARS-CoV-2/FLU/RSV plus assay is intended as an aid in the diagnosis of influenza from Nasopharyngeal swab specimens and should not be used as a sole basis for treatment. Nasal washings and aspirates are unacceptable for Xpert Xpress SARS-CoV-2/FLU/RSV testing.  Fact Sheet for Patients: BloggerCourse.com  Fact Sheet for Healthcare Providers: SeriousBroker.it  This test is not yet approved or cleared by the [place]  FDA and has been  authorized for detection and/or diagnosis of SARS-CoV-2 by FDA under an Emergency Use Authorization (EUA). This EUA will remain in effect (meaning this test can be used) for the duration of the COVID-19 declaration under Section 564(b)(1) of the Act, 21 U.S.C. section 360bbb-3(b)(1), unless the authorization is terminated or revoked.     Resp Syncytial Virus by PCR NEGATIVE NEGATIVE Final  Comment: (NOTE) Fact Sheet for Patients: BloggerCourse.com  Fact Sheet for Healthcare Providers: SeriousBroker.it  This test is not yet approved or cleared by the [place]  FDA and has been authorized for detection and/or diagnosis of SARS-CoV-2 by FDA under an Emergency Use Authorization (EUA). This EUA will remain in effect (meaning this test can be used) for the duration of the COVID-19 declaration under Section 564(b)(1) of the Act, 21 U.S.C. section 360bbb-3(b)(1), unless the authorization is terminated or revoked.  Performed at [facility], [address] [place], [place] [address]   MRSA Next Gen by PCR, Nasal     Status: None   Collection Time: 08/11/23  3:45 PM   Specimen: Nasal Mucosa; Nasal Swab  Result Value Ref Range Status   MRSA by PCR Next Gen NOT DETECTED NOT DETECTED Final    Comment: (NOTE) The GeneXpert MRSA Assay (FDA approved for NASAL specimens only), is one component of a comprehensive MRSA colonization surveillance program. It is not intended to diagnose MRSA infection nor to guide or monitor treatment for MRSA infections. Test performance is not FDA approved in patients less than 61 years old. Performed at [facility], [address] [place], [place] [address]          Radiology Studies: No results found.       Scheduled Meds:  [START ON 08/14/2023] amLODipine  10 mg Oral Daily   amoxicillin-clavulanate  1 tablet Oral Q12H   arformoterol  15 mcg  Nebulization BID   budesonide (PULMICORT) nebulizer solution  0.5 mg Nebulization BID   cycloSPORINE  1 drop Both Eyes BID   enoxaparin (LOVENOX) injection  40 mg Subcutaneous QHS   fluticasone  2 spray Each Nare Daily   guaiFENesin  1,200 mg Oral BID   hydroxychloroquine  200 mg Oral BID   ipratropium-albuterol   3 mL Nebulization Q4H   levothyroxine  137 mcg Oral Q0600   loratadine  10 mg Oral Daily   methylPREDNISolone (SOLU-MEDROL) injection  81.25 mg Intravenous BID   pantoprazole  40 mg Oral Daily   polyethylene glycol  17 g Oral BID   [START ON 08/14/2023] predniSONE   60 mg Oral QAC breakfast   ramipril  10 mg Oral Daily   senna-docusate  1 tablet Oral BID   sertraline  50 mg Oral Daily   Continuous Infusions:   LOS: 2 days    Time spent: 40 minutes    Hilda Lovings, MD [facility]   To contact the attending provider between 7A-7P or the covering provider during after hours 7P-7A, please log into the web site [email or web address] and access using universal [place] password for that web site. If you do not have the password, please call the hospital operator.  08/13/2023, 6:33 PM

## 2023-08-14 ENCOUNTER — Inpatient Hospital Stay (HOSPITAL_COMMUNITY)

## 2023-08-14 DIAGNOSIS — R0602 Shortness of breath: Secondary | ICD-10-CM

## 2023-08-14 DIAGNOSIS — J441 Chronic obstructive pulmonary disease with (acute) exacerbation: Secondary | ICD-10-CM | POA: Diagnosis not present

## 2023-08-14 DIAGNOSIS — J9601 Acute respiratory failure with hypoxia: Secondary | ICD-10-CM | POA: Diagnosis not present

## 2023-08-14 LAB — RENAL FUNCTION PANEL
Albumin: 3.7 g/dL (ref 3.5–5.0)
Anion gap: 9 (ref 5–15)
BUN: 21 mg/dL (ref 8–23)
CO2: 30 mmol/L (ref 22–32)
Calcium: 9 mg/dL (ref 8.9–10.3)
Chloride: 90 mmol/L — ABNORMAL LOW (ref 98–111)
Creatinine, Ser: 0.74 mg/dL (ref 0.44–1.00)
GFR, Estimated: >60 mL/min (ref >60)
Glucose, Bld: 125 mg/dL — ABNORMAL HIGH (ref 70–99)
Phosphorus: 3.6 mg/dL (ref 2.5–4.6)
Potassium: 5 mmol/L (ref 3.5–5.1)
Sodium: 129 mmol/L — ABNORMAL LOW (ref 135–145)

## 2023-08-14 LAB — CBC WITH DIFFERENTIAL/PLATELET
Abs Immature Granulocytes: 0.02 10*3/uL (ref 0.00–0.07)
Basophils Absolute: 0 10*3/uL (ref 0.0–0.1)
Basophils Relative: 0 %
Eosinophils Absolute: 0 10*3/uL (ref 0.0–0.5)
Eosinophils Relative: 0 %
HCT: 41.3 % (ref 36.0–46.0)
Hemoglobin: 13.3 g/dL (ref 12.0–15.0)
Immature Granulocytes: 0 %
Lymphocytes Relative: 8 %
Lymphs Abs: 0.6 10*3/uL — ABNORMAL LOW (ref 0.7–4.0)
MCH: 29.3 pg (ref 26.0–34.0)
MCHC: 32.2 g/dL (ref 30.0–36.0)
MCV: 91 fL (ref 80.0–100.0)
Monocytes Absolute: 0.3 10*3/uL (ref 0.1–1.0)
Monocytes Relative: 4 %
Neutro Abs: 6.9 10*3/uL (ref 1.7–7.7)
Neutrophils Relative %: 88 %
Platelets: 176 10*3/uL (ref 150–400)
RBC: 4.54 MIL/uL (ref 3.87–5.11)
RDW: 13.5 % (ref 11.5–15.5)
WBC: 7.9 10*3/uL (ref 4.0–10.5)
nRBC: 0 % (ref 0.0–0.2)

## 2023-08-14 LAB — ECHOCARDIOGRAM COMPLETE
Area-P 1/2: 3.85 cm2
Calc EF: 62.2 %
Height: 63 in
S' Lateral: 2.7 cm
Single Plane A2C EF: 61.1 %
Single Plane A4C EF: 62.3 %
Weight: 2599.66 [oz_av]

## 2023-08-14 LAB — MAGNESIUM: Magnesium: 1.9 mg/dL (ref 1.7–2.4)

## 2023-08-14 LAB — SODIUM: Sodium: 127 mmol/L — ABNORMAL LOW (ref 135–145)

## 2023-08-14 MED ORDER — FUROSEMIDE 10 MG/ML IJ SOLN
40.0000 mg | Freq: Every day | INTRAMUSCULAR | Status: DC
  Administered 2023-08-14: 40 mg via INTRAVENOUS
  Filled 2023-08-14: qty 4

## 2023-08-14 NOTE — Hospital Course (Signed)
 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 Patient admitted for probable acute COPD exacerbation. Sputum also ordered and obtained and pending. 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 Patient however per protocol placed in isolation as AFB was ordered and treated for COPD exacerbation. ID consulted.

## 2023-08-14 NOTE — Evaluation (Signed)
 Occupational Therapy Evaluation Patient Details Name: Aimee Brown MRN: [id number] DOB: [birth year] Today's Date: 08/14/2023   History of Present Illness   Pt is a 83 y.o. female admitted with acute COPD exacerbation/acute hypoxic respiratory failure. 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 Pt lives with spouse. Pt close to baseline for functional mobility and ADL performance, has been completing ADLs independently in room, reports SOB with exertion. Performed functional mobility without AD 200 ft in hallway (assist only for O2 line) and t/f bathroom for toileting needs with supervision. On 2L via [place]. SpO2 dropping to 84% on RA, placed back on 2L via [place] and SpO2 increases to 90% while walking, back up to 94% seated in recliner end of session. Pt educated on energy conservation strategies for continued independence at home.   Pt would benefit from skilled OT services to address noted impairments and functional limitations (see below for any additional details) in order to maximize safety and independence while minimizing falls risk and caregiver burden. Do not anticipate the need for follow up OT services upon acute hospital DC.      If plan is discharge home, recommend the following:   A little help with walking and/or transfers;A little help with bathing/dressing/bathroom;Assist for transportation     Functional Status Assessment   Patient has had a recent decline in their functional status and demonstrates the ability to make significant improvements in function in a reasonable and predictable amount of time.     Equipment Recommendations   None recommended by OT      Precautions/Restrictions   Precautions Precautions: Fall Restrictions Weight Bearing Restrictions Per Provider Order: No      Mobility Bed Mobility Overal bed mobility: Modified Independent             General bed mobility comments: use of rail    Transfers Overall transfer level: Modified independent Equipment used: None               General transfer comment: increased time      Balance Overall balance assessment: Mild deficits observed, not formally tested                                         ADL either performed or assessed with clinical judgement   ADL Overall ADL's : At baseline;Modified independent                                       General ADL Comments: Pt close to baseline for ADL performance, has been performing ADLs independently in room. Completes t/f bathroom without AD, assist only for O2 tank / line, use of grab bar for toilet transfer and supervision for clothing mangement/hygiene     Vision Baseline Vision/History: 1 Wears glasses Ability to See in Adequate Light: 0 Adequate Patient Visual Report: No change from baseline Vision Assessment?: Wears glasses for reading;Wears glasses for driving            Pertinent Vitals/Pain Pain Assessment Pain Assessment: No/denies pain     Extremity/Trunk Assessment Upper Extremity Assessment Upper Extremity Assessment: Overall WFL for tasks assessed;Right hand dominant   Lower Extremity Assessment Lower Extremity Assessment: Defer  to PT evaluation   Cervical / Trunk Assessment Cervical / Trunk Assessment: Normal   Communication Communication Communication: No apparent difficulties   Cognition Arousal: Alert Behavior During Therapy: WFL for tasks assessed/performed Cognition: No apparent impairments                               Following commands: Intact       Cueing  General Comments   Cueing Techniques: Verbal cues  On 2L via [place]. SpO2 dropping to 84% on RA with functional mobility in hallway, placed back on 2L via [place] and SpO2 increases to 90% while  walking, back up to 94% seated in recliner end of session.   Exercises     Shoulder Instructions      Home Living Family/patient expects to be discharged to:: Private residence Living Arrangements: Spouse/significant other Available Help at Discharge: Family;Available 24 hours/day Type of Home: House Home Access: Stairs to enter [employer] of Steps: 1 Entrance Stairs-Rails: Can reach both Home Layout: One level     Bathroom Shower/Tub: Producer, television/film/video: Handicapped height     Home Equipment: Grab bars - toilet;Grab bars - tub/shower          Prior Functioning/Environment Prior Level of Function : Independent/Modified Independent;Driving             Mobility Comments: no falls hx ADLs Comments: independent, drives    OT Problem List: Decreased activity tolerance;Decreased knowledge of use of DME or AE;Cardiopulmonary status limiting activity   OT Treatment/Interventions: Self-care/ADL training;Therapeutic exercise;Energy conservation;DME and/or AE instruction;Therapeutic activities;Patient/family education      OT Goals(Current goals can be found in the care plan section)   Acute Rehab OT Goals Patient Stated Goal: t+14 OT Goal Formulation: With patient Time For Goal Achievement: 08/28/23 Potential to Achieve Goals: Good   OT Frequency:  Min 2X/week       AM-PAC OT "6 Clicks" Daily Activity     Outcome Measure Help from another person eating meals?: None Help from another person taking care of personal grooming?: None Help from another person toileting, which includes using toliet, bedpan, or urinal?: A Little Help from another person bathing (including washing, rinsing, drying)?: A Little Help from another person to put on and taking off regular upper body clothing?: None Help from another person to put on and taking off regular lower body clothing?: A Little 6 Click Score: 21   End of Session Equipment Utilized During  Treatment: Gait belt;Oxygen Nurse Communication: Mobility status  Activity Tolerance: Patient tolerated treatment well Patient left: in chair;with call bell/phone within reach;with chair alarm set  OT Visit Diagnosis: Other abnormalities of gait and mobility (R26.89)                Time: 1610-9604 OT Time Calculation (min): 21 min Charges:  OT General Charges $OT Visit: 1 Visit OT Evaluation $OT Eval Low Complexity: 1 Low OT Treatments $Self Care/Home Management : 8-22 mins  Delitha Elms L. Yamen Castrogiovanni, OTR/L  08/14/23, 1:20 PM

## 2023-08-14 NOTE — Progress Notes (Signed)
 2D echo attempted, patient in chair. Will try later

## 2023-08-14 NOTE — Progress Notes (Signed)
 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 Patient admitted for probable acute COPD exacerbation. Sputum also ordered and obtained and pending. 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 Patient however per protocol placed in isolation as AFB was ordered and treated for COPD exacerbation. ID consulted.   Assessment and Plan: #1  Acute respiratory failure with hypoxia secondary to acute COPD exacerbation/history of MAI -Patient presented with 4 to 5-day history of worsening shortness of breath, worsening diffuse wheezing, productive cough of greenish sputum and noted to be hypoxic.  Patient not on O2 at baseline. - Chest x-ray done with no acute findings. - Patient with noted history of positive AFB and case discussed with pulmonary, Dr. Felipe Horton who advised that patient has a MAI and did not need isolation. - AFB was ordered and due to hospital protocol patient placed on isolation. - Patient with a history of documented MAI and does not need airborne isolation in discussion with ID. - Continue scheduled Pulmicort, Brovana, DuoNebs. - Discontinued azithromycin  and now on Augmentin to complete a 5 to 7-day course of treatment per ID recommendations. - Continue Claritin, Flonase, PPI, Mucinex. -Patient seen in consultation by ID who are in agreement with pulmonary's plan to see if can isolate further 1-2 additional specimens during this hospitalization or on discharge with close outpatient follow-up as already scheduled with pulmonary. - Continue flutter valve, incentive  spirometry. -Per ID no need for isolation and airborne/contact isolation has been discontinued per ID. - Outpatient follow-up with pulmonary. -Remains on O2 this AM. Concern of continued vol overload -2d echo reviewed. Normal LVEF of 60-65%. Mildly reduced RV function with moderately enlarged RV size -Will continue lasix   2.  Hypertension - continued orvasc to 10 mg daily and ramapril   3.  Hyponatremia TSH at 1.102. - Urine Na elevated at 61, Urine osm 571, serum osm 290   4.  Hypothyroidism - Synthroid.   5.  GERD - Continue PPI.   6.  Depression - Continue home regimen Zoloft.    7.  Hyperlipidemia -Not on any statins per med rec. - Outpatient follow-up with PCP.      Subjective: Eager to go home soon  Physical Exam: Vitals:   08/14/23 0849 08/14/23 0900 08/14/23 1303 08/14/23 1359  BP:   (!) 158/77   Pulse:   93   Resp:   18   Temp:   98.3 F (36.8 C)   TempSrc:   Oral   SpO2: 93% 94% 91% 93%  Weight:      Height:       General exam: Awake, laying in bed, in nad Respiratory system: Normal respiratory effort, no wheezing Cardiovascular system: regular rate, s1, s2 Gastrointestinal system: Soft, nondistended, positive BS Central nervous system: CN2-12 grossly intact, strength intact Extremities: Perfused, no clubbing Skin: Normal skin turgor, no notable skin lesions seen Psychiatry: Mood normal // no visual hallucinations   Data Reviewed:  Labs reviewed: Na 129-127, K 5.0, Cr 0.74, WBC 7.9, Hgb 13.3  Family Communication: Pt in room, family at bedside  Disposition: Status is: Inpatient Remains inpatient appropriate because: severity of  illness  Planned Discharge Destination: Home    Author: Cherylle Corwin, MD 08/14/2023 4:17 PM  For on call review [email or web address].

## 2023-08-14 NOTE — Progress Notes (Signed)
 2D echo attempted 0759, patient getting meds, attempted 858 patient in bathroom. Will try later

## 2023-08-14 NOTE — Progress Notes (Signed)
  Echocardiogram 2D Echocardiogram has been performed.  Royden Corin 08/14/2023, 11:54 AM3

## 2023-08-14 NOTE — Progress Notes (Signed)
 Patient ambulated to restroom with extension tubing on her [place] turned up to 3Lpm and pt was SOB with sats of 86% when she returned to bed. Patient receiving the rest of her breathting treatment and sats have recovered to 94%. Patient placed back on 2Lpm [place].

## 2023-08-14 NOTE — Plan of Care (Signed)
  Problem: Activity: Goal: Risk for activity intolerance will decrease Outcome: Progressing   Problem: Coping: Goal: Level of anxiety will decrease Outcome: Progressing   Problem: Nutrition: Goal: Adequate nutrition will be maintained Outcome: Progressing

## 2023-08-14 NOTE — Evaluation (Signed)
 Physical Therapy Evaluation-1x  Patient Details Name: Aimee Brown MRN: [id number] DOB: 1940-10-23 Today's Date: 08/14/2023  History of Present Illness  Pt is a 83 y.o. female admitted with acute COPD exacerbation/acute hypoxic respiratory failure. 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 She walked ~200 feet around the unit. O2 83-85% on 2L with ambulation, 94% on 2L at rest. Dyspnea 2/4. Pt reported fatigue as well. Do not anticipate any further PT needs at this time or once discharged. Pt reports she is walking to/from bathroom on her own. Encouraged pt to continue performing breathing exercises as tolerated. Recommend continued O2 assessments with nursing staff and daily ambulation with mobility team as able. 1x eval. Will sign off.         If plan is discharge home, recommend the following:     Can travel by private vehicle        Equipment Recommendations None recommended by PT  Recommendations for Other Services       Functional Status Assessment Patient has had a recent decline in their functional status and demonstrates the ability to make significant improvements in function in a reasonable and predictable amount of time.     Precautions / Restrictions Precautions Precautions: Fall Precaution/Restrictions Comments: monitor O2 Restrictions Weight Bearing Restrictions Per Provider Order: No      Mobility  Bed Mobility Overal bed mobility: Modified Independent                  Transfers Overall transfer level: Modified independent                      Ambulation/Gait Ambulation/Gait assistance: Supervision Gait Distance (Feet): 200 Feet Assistive device: None Gait Pattern/deviations: Step-through pattern       General Gait Details: Interimttent unsteadiness but no overt LOB. Dyspnea and fatigue towards end  of distance. O2 83-85% on 2L, dyspnea 2/4.  Stairs            Wheelchair Mobility     Tilt Bed    Modified Rankin (Stroke Patients Only)       Balance Overall balance assessment: Mild deficits observed, not formally tested                                           Pertinent Vitals/Pain Pain Assessment Pain Assessment: No/denies pain    Home Living Family/patient expects to be discharged to:: Private residence Living Arrangements: Spouse/significant other Available Help at Discharge: Family;Available 24 hours/day Type of Home: House Home Access: Stairs to enter Entrance Stairs-Rails: Can reach both Entrance Stairs-Number of Steps: 1   Home Layout: One level Home Equipment: Grab bars - toilet;Grab bars - tub/shower      Prior Function Prior Level of Function : Independent/Modified Independent               ADLs Comments: independent, drives     Extremity/Trunk Assessment   Upper Extremity Assessment Upper Extremity Assessment: Defer to OT evaluation    Lower Extremity Assessment Lower Extremity Assessment: Overall WFL for tasks assessed    Cervical / Trunk Assessment Cervical / Trunk Assessment: Normal  Communication   Communication Communication: No apparent difficulties    Cognition Arousal: Alert Behavior During Therapy: WFL for tasks assessed/performed   PT - Cognitive  impairments: No apparent impairments                         Following commands: Intact       Cueing Cueing Techniques: Verbal cues     General Comments      Exercises     Assessment/Plan    PT Assessment Patient does not need any further PT services  PT Problem List         PT Treatment Interventions      PT Goals (Current goals can be found in the Care Plan section)  Acute Rehab PT Goals Patient Stated Goal: home soon PT Goal Formulation: All assessment and education complete, DC therapy    Frequency        Co-evaluation               AM-PAC PT "6 Clicks" Mobility  Outcome Measure Help needed turning from your back to your side while in a flat bed without using bedrails?: None Help needed moving from lying on your back to sitting on the side of a flat bed without using bedrails?: None Help needed moving to and from a bed to a chair (including a wheelchair)?: None Help needed standing up from a chair using your arms (e.g., wheelchair or bedside chair)?: None Help needed to walk in hospital room?: A Little Help needed climbing 3-5 steps with a railing? : A Little 6 Click Score: 22    End of Session   Activity Tolerance: Patient limited by fatigue;Patient tolerated treatment well Patient left: in bed;with call bell/phone within reach;with family/visitor present   PT Visit Diagnosis: Unsteadiness on feet (R26.81)    Time: 1610-9604 PT Time Calculation (min) (ACUTE ONLY): 14 min   Charges:   PT Evaluation $PT Eval Low Complexity: 1 Low   PT General Charges $$ ACUTE PT VISIT: 1 Visit            Tanda Falter, PT Acute Rehabilitation  Office: [phone]

## 2023-08-15 DIAGNOSIS — J441 Chronic obstructive pulmonary disease with (acute) exacerbation: Secondary | ICD-10-CM | POA: Diagnosis not present

## 2023-08-15 DIAGNOSIS — J9601 Acute respiratory failure with hypoxia: Secondary | ICD-10-CM | POA: Diagnosis not present

## 2023-08-15 LAB — COMPREHENSIVE METABOLIC PANEL WITH GFR
ALT: 30 U/L (ref 0–44)
AST: 44 U/L — ABNORMAL HIGH (ref 15–41)
Albumin: 3.8 g/dL (ref 3.5–5.0)
Alkaline Phosphatase: 61 U/L (ref 38–126)
Anion gap: 8 (ref 5–15)
BUN: 25 mg/dL — ABNORMAL HIGH (ref 8–23)
CO2: 33 mmol/L — ABNORMAL HIGH (ref 22–32)
Calcium: 8.9 mg/dL (ref 8.9–10.3)
Chloride: 85 mmol/L — ABNORMAL LOW (ref 98–111)
Creatinine, Ser: 0.83 mg/dL (ref 0.44–1.00)
GFR, Estimated: >60 mL/min (ref >60)
Glucose, Bld: 92 mg/dL (ref 70–99)
Potassium: 3.9 mmol/L (ref 3.5–5.1)
Sodium: 126 mmol/L — ABNORMAL LOW (ref 135–145)
Total Bilirubin: 0.8 mg/dL (ref 0.0–1.2)
Total Protein: 7.1 g/dL (ref 6.5–8.1)

## 2023-08-15 LAB — ACID FAST SMEAR (AFB, MYCOBACTERIA)
Acid Fast Smear: NEGATIVE
Acid Fast Smear: NEGATIVE

## 2023-08-15 LAB — CBC
HCT: 44.4 % (ref 36.0–46.0)
Hemoglobin: 14.4 g/dL (ref 12.0–15.0)
MCH: 29.5 pg (ref 26.0–34.0)
MCHC: 32.4 g/dL (ref 30.0–36.0)
MCV: 91 fL (ref 80.0–100.0)
Platelets: 204 10*3/uL (ref 150–400)
RBC: 4.88 MIL/uL (ref 3.87–5.11)
RDW: 13.4 % (ref 11.5–15.5)
WBC: 9.8 10*3/uL (ref 4.0–10.5)
nRBC: 0 % (ref 0.0–0.2)

## 2023-08-15 MED ORDER — SODIUM CHLORIDE 1 G PO TABS
1.0000 g | ORAL_TABLET | Freq: Three times a day (TID) | ORAL | Status: DC
  Administered 2023-08-15 – 2023-08-16 (×4): 1 g via ORAL
  Filled 2023-08-15 (×5): qty 1

## 2023-08-15 NOTE — Progress Notes (Signed)
 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 Patient admitted for probable acute COPD exacerbation. Sputum also ordered and obtained and pending. 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 Patient however per protocol placed in isolation as AFB was ordered and treated for COPD exacerbation. ID consulted.   Assessment and Plan: #1  Acute respiratory failure with hypoxia secondary to acute COPD exacerbation/history of MAI -Patient presented with 4 to 5-day history of worsening shortness of breath, worsening diffuse wheezing, productive cough of greenish sputum and noted to be hypoxic.  Patient not on O2 at baseline. - Chest x-ray done with no acute findings. - Patient with noted history of positive AFB and case discussed with pulmonary, Dr. Felipe Horton who advised that patient has a MAI and did not need isolation. - AFB was ordered and due to hospital protocol patient placed on isolation. - Patient with a history of documented MAI and does not need airborne isolation in discussion with ID. - Continue scheduled Pulmicort, Brovana, DuoNebs. - Discontinued azithromycin  and now on Augmentin to complete a 5 to 7-day course of treatment per ID recommendations. - Continue Claritin, Flonase, PPI, Mucinex. -Patient seen in consultation by ID who are in agreement with pulmonary's plan to see if can isolate further 1-2 additional specimens during this hospitalization or on discharge with close outpatient follow-up as already scheduled with pulmonary. - Continue flutter valve, incentive  spirometry. -Per ID no need for isolation and airborne/contact isolation has been discontinued per ID. - Outpatient follow-up with pulmonary. -Remains on O2 this AM. Concern of continued vol overload -2d echo reviewed. Normal LVEF of 60-65%. Mildly reduced RV function with moderately enlarged RV size -Recently given doses of lasix, now stopped   2.  Hypertension - continued norvasc 10 mg daily and ramapril   3.  Hyponatremia TSH at 1.102. - Urine Na elevated at 61, Urine osm 571, serum osm 290 -Recommend fluid restriction. Added 1g sodium tab TID -follow Na trends   4.  Hypothyroidism - Synthroid.   5.  GERD - Continue PPI.   6.  Depression - Continue home regimen Zoloft.    7.  Hyperlipidemia -Not on any statins per med rec. - Outpatient follow-up with PCP.      Subjective: Hoping to go home soon  Physical Exam: Vitals:   08/15/23 0540 08/15/23 0623 08/15/23 0725 08/15/23 1325  BP: (!) 148/72   139/69  Pulse: 64   78  Resp: 18   18  Temp: 98.6 F (37 C)   98.8 F (37.1 C)  TempSrc: Oral   Oral  SpO2: 100%  94% 98%  Weight:  70 kg    Height:       General exam: Conversant, in no acute distress Respiratory system: normal chest rise, clear, no audible wheezing Cardiovascular system: regular rhythm, s1-s2 Gastrointestinal system: Nondistended, nontender, pos BS Central nervous system: No seizures, no tremors Extremities: No cyanosis, no joint deformities Skin: No rashes, no pallor Psychiatry: Affect normal // no auditory hallucinations   Data Reviewed:  Labs reviewed: Na 126, K 3.9, Cr 0.83, WBC 9.8 Hgb 14.4  Family Communication: Pt in  room, family at bedside  Disposition: Status is: Inpatient Remains inpatient appropriate because: severity of illness  Planned Discharge Destination: Home    Author: Cherylle Corwin, MD 08/15/2023 5:41 PM  For on call review [email or web address].

## 2023-08-15 NOTE — Plan of Care (Signed)
   Problem: Activity: Goal: Risk for activity intolerance will decrease Outcome: Progressing   Problem: Pain Managment: Goal: General experience of comfort will improve and/or be controlled Outcome: Progressing   Problem: Safety: Goal: Ability to remain free from injury will improve Outcome: Progressing

## 2023-08-16 ENCOUNTER — Other Ambulatory Visit (HOSPITAL_COMMUNITY): Payer: Self-pay

## 2023-08-16 LAB — CBC
HCT: 39.8 % (ref 36.0–46.0)
Hemoglobin: 13.1 g/dL (ref 12.0–15.0)
MCH: 29 pg (ref 26.0–34.0)
MCHC: 32.9 g/dL (ref 30.0–36.0)
MCV: 88.2 fL (ref 80.0–100.0)
Platelets: 162 10*3/uL (ref 150–400)
RBC: 4.51 MIL/uL (ref 3.87–5.11)
RDW: 13.3 % (ref 11.5–15.5)
WBC: 5.8 10*3/uL (ref 4.0–10.5)
nRBC: 0 % (ref 0.0–0.2)

## 2023-08-16 LAB — COMPREHENSIVE METABOLIC PANEL WITH GFR
ALT: 29 U/L (ref 0–44)
AST: 30 U/L (ref 15–41)
Albumin: 3.3 g/dL — ABNORMAL LOW (ref 3.5–5.0)
Alkaline Phosphatase: 55 U/L (ref 38–126)
Anion gap: 6 (ref 5–15)
BUN: 24 mg/dL — ABNORMAL HIGH (ref 8–23)
CO2: 31 mmol/L (ref 22–32)
Calcium: 8.6 mg/dL — ABNORMAL LOW (ref 8.9–10.3)
Chloride: 93 mmol/L — ABNORMAL LOW (ref 98–111)
Creatinine, Ser: 0.73 mg/dL (ref 0.44–1.00)
GFR, Estimated: >60 mL/min (ref >60)
Glucose, Bld: 96 mg/dL (ref 70–99)
Potassium: 3.8 mmol/L (ref 3.5–5.1)
Sodium: 130 mmol/L — ABNORMAL LOW (ref 135–145)
Total Bilirubin: 0.7 mg/dL (ref 0.0–1.2)
Total Protein: 6.1 g/dL — ABNORMAL LOW (ref 6.5–8.1)

## 2023-08-16 MED ORDER — AMOXICILLIN-POT CLAVULANATE 875-125 MG PO TABS
1.0000 | ORAL_TABLET | Freq: Two times a day (BID) | ORAL | 0 refills | Status: AC
Start: 2023-08-16 — End: 2023-08-19
  Filled 2023-08-16: qty 6, 3d supply, fill #0

## 2023-08-16 MED ORDER — AMLODIPINE BESYLATE 10 MG PO TABS
10.0000 mg | ORAL_TABLET | Freq: Every day | ORAL | 0 refills | Status: AC
  Filled 2023-08-16: qty 30, 30d supply, fill #0

## 2023-08-16 MED ORDER — GUAIFENESIN ER 600 MG PO TB12
1200.0000 mg | ORAL_TABLET | Freq: Two times a day (BID) | ORAL | 0 refills | Status: AC
  Filled 2023-08-16: qty 20, 5d supply, fill #0

## 2023-08-16 MED ORDER — SODIUM CHLORIDE 1 G PO TABS
1.0000 g | ORAL_TABLET | Freq: Three times a day (TID) | ORAL | 0 refills | Status: AC
  Filled 2023-08-16: qty 15, 5d supply, fill #0

## 2023-08-16 NOTE — Progress Notes (Signed)
 Discharge medication delivered to patient at bedside [facility]

## 2023-08-16 NOTE — Progress Notes (Signed)
 SATURATION QUALIFICATIONS: (This note is used to comply with regulatory documentation for home oxygen)  Patient Saturations on Room Air at Rest = 91%  Patient Saturations on Room Air while Ambulating = 82%  Patient Saturations on 2 Liters of oxygen while Ambulating = 97%  Please briefly explain why patient needs home oxygen:

## 2023-08-16 NOTE — Discharge Summary (Signed)
 Physician Discharge Summary   Patient: Aimee Brown MRN: [id number] DOB: 1940-04-01  Admit date:     08/11/2023  Discharge date: 08/16/23  Discharge Physician: Cherylle Corwin   PCP: Suan Elm, MD   Recommendations at discharge:    Follow up with PCP In 1-2 weeks Follow up with Pulmonary as scheduled Recommend recheck BMET in one week, focus on sodium  Discharge Diagnoses: Principal Problem:   Acute respiratory failure with hypoxia (HCC) Active Problems:   COPD exacerbation (HCC)   Hypertensive heart disease without CHF   Hyperlipidemia   Depression   GERD (gastroesophageal reflux disease)   Hypothyroidism   Hyponatremia  Resolved Problems:   * No resolved hospital problems. 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 Patient admitted for probable acute COPD exacerbation. Sputum also ordered and obtained and pending. 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 Patient however per protocol placed in isolation as AFB was ordered and treated for COPD exacerbation. ID consulted.   Assessment and Plan: #1  Acute respiratory failure with hypoxia secondary to acute COPD exacerbation/history of MAI -Patient presented with 4 to 5-day history of worsening shortness of breath, worsening diffuse wheezing, productive cough of greenish sputum and noted to be hypoxic.  Patient not on O2 at baseline. - Chest x-ray done with no acute findings. - Patient with noted history of positive AFB and case discussed with pulmonary, Dr. Felipe Horton who advised that patient has a MAI and did not need isolation. - AFB was ordered and due to hospital protocol patient placed on isolation. - Patient with a history of documented MAI and  does not need airborne isolation in discussion with ID. - Continue scheduled Pulmicort, Brovana, DuoNebs. - Discontinued azithromycin  and now on Augmentin to complete a 5 to 7-day course of treatment per ID recommendations. - Continue Claritin, Flonase, PPI, Mucinex. -Patient seen in consultation by ID who are in agreement with pulmonary's plan to see if can isolate further 1-2 additional specimens during this hospitalization or on discharge with close outpatient follow-up as already scheduled with pulmonary. - Continue flutter valve, incentive spirometry. -Per ID no need for isolation and airborne/contact isolation has been discontinued per ID. - Outpatient follow-up with pulmonary. -Remains on O2 this AM. Meets home O2 requirements, DME placed for [facility] -2d echo reviewed. Normal LVEF of 60-65%. Mildly reduced RV function with moderately enlarged RV size    2.  Hypertension - continued norvasc 10 mg daily and ramapril   3.  Hyponatremia TSH at 1.102. - Urine Na elevated at 61, Urine osm 571, serum osm 290 -Recommend fluid restriction. Added 1g sodium tab TID -sodium improved to 130 -Recommend 5 days of PO sodium  -Recommend recheck sodium level in 1 week   4.  Hypothyroidism - Continued Synthroid.   5.  GERD - Continue PPI.   6.  Depression - Continue home regimen Zoloft.    7.  Hyperlipidemia -Not on any statins per med rec. - Outpatient follow-up with PCP.          Consultants:  Procedures performed:   Disposition: Home Diet recommendation:  Regular diet DISCHARGE MEDICATION: Allergies as of 08/16/2023   No Known Allergies      Medication List     STOP taking these medications  azithromycin  250 MG tablet Commonly known as: ZITHROMAX    doxycycline  100 MG tablet Commonly known as: VIBRA -TABS   predniSONE  10 MG tablet Commonly known as: DELTASONE    sodium chloride  HYPERTONIC 3 % nebulizer solution       TAKE these medications    albuterol  (2.5  MG/3ML) 0.083% nebulizer solution Commonly known as: PROVENTIL  USE 1 VIAL VIA NEBULIZER EVERY 6 HOURS AS NEEDED FOR WHEEZING OR SHORTNESS OF BREATH What changed: Another medication with the same name was changed. Make sure you understand how and when to take each.   albuterol  108 (90 Base) MCG/ACT inhaler Commonly known as: VENTOLIN  HFA TAKE 2 PUFFS BY MOUTH EVERY 6 HOURS AS NEEDED FOR WHEEZE OR SHORTNESS OF BREATH What changed: See the new instructions.   amLODipine 10 MG tablet Commonly known as: NORVASC Take 1 tablet (10 mg total) by mouth daily. Start taking on: Aug 17, 2023   amoxicillin-clavulanate 875-125 MG tablet Commonly known as: AUGMENTIN Take 1 tablet by mouth every 12 (twelve) hours for 3 days.   Anoro Ellipta  62.5-25 MCG/ACT Aepb Generic drug: umeclidinium-vilanterol INHALE 1 PUFF INTO LUNGS EVERY DAY   Cequa 0.09 % Soln Generic drug: cycloSPORINE (PF) Place 1 drop into both eyes in the morning and at bedtime.   guaiFENesin 600 MG 12 hr tablet Commonly known as: MUCINEX Take 2 tablets (1,200 mg total) by mouth 2 (two) times daily.   hydroxychloroquine 200 MG tablet Commonly known as: PLAQUENIL Take 200 mg by mouth 2 (two) times daily.   levothyroxine 137 MCG tablet Commonly known as: SYNTHROID Take 137 mcg by mouth daily before breakfast.   omeprazole 20 MG capsule Commonly known as: PRILOSEC Take 20 mg by mouth daily before breakfast.   promethazine -dextromethorphan 6.25-15 MG/5ML syrup Commonly known as: PROMETHAZINE -DM Take 5 mLs by mouth 4 (four) times daily as needed for cough.   ramipril 10 MG capsule Commonly known as: ALTACE Take 10 mg by mouth daily.   sertraline 50 MG tablet Commonly known as: ZOLOFT Take 50 mg by mouth daily.   sodium chloride  1 g tablet Take 1 tablet (1 g total) by mouth 3 (three) times daily with meals for 5 days.   TYLENOL  500 MG tablet Generic drug: acetaminophen  Take 500-1,000 mg by mouth every 6 (six) hours  as needed for mild pain (pain score 1-3) or headache.   VITAMIN D3 PO Take 1 capsule by mouth daily.               Durable Medical Equipment  (From admission, onward)           Start     Ordered   08/16/23 1211  For home use only DME oxygen  Once       Question Answer Comment  Length of Need 6 Months   Mode or (Route) Nasal cannula   Liters per Minute 2   Frequency Continuous (stationary and portable oxygen unit needed)   Oxygen delivery system Gas      08/16/23 1210            Follow-up Information     Suan Elm, MD Follow up in 2 week(s).   Specialty: Internal Medicine Why: Hospital follow up Contact information: [address] [place] [address] [phone]                Discharge Exam: Cleavon Curls Weights   08/13/23 5784 08/15/23 0623 08/16/23 0137  Weight: 73.7 kg 70 kg 71.4 kg   General exam: Awake, laying  in bed, in nad Respiratory system: Normal respiratory effort, no wheezing Cardiovascular system: regular rate, s1, s2 Gastrointestinal system: Soft, nondistended, positive BS Central nervous system: CN2-12 grossly intact, strength intact Extremities: Perfused, no clubbing Skin: Normal skin turgor, no notable skin lesions seen Psychiatry: Mood normal // no visual hallucinations   Condition at discharge: fair  The results of significant diagnostics from this hospitalization (including imaging, microbiology, ancillary and laboratory) are listed below for reference.   Imaging Studies: ECHOCARDIOGRAM COMPLETE Result Date: 08/14/2023    ECHOCARDIOGRAM REPORT   Patient Name:   TOMMI CREPEAU Date of Exam: 08/14/2023 Medical Rec #:  [id number]     Height:       63.0 in Accession #:    [id number]    Weight:       162.5 lb Date of Birth:  1941-03-17     BSA:          1.770 m Patient Age:    82 years      BP:           150/73 mmHg Patient Gender: F             HR:           82 bpm. Exam Location:  Inpatient Procedure: 2D Echo, Cardiac  Doppler and Color Doppler (Both Spectral and Color            Flow Doppler were utilized during procedure). Indications:    R06.02 SOB  History:        Patient has prior history of Echocardiogram examinations, most                 recent 06/28/2017. COPD, Signs/Symptoms:Dyspnea and Shortness of                 Breath; Risk Factors:Hypertension and Dyslipidemia.  Sonographer:    Raynelle Callow RDCS Referring Phys: [id number] DANIEL V THOMPSON IMPRESSIONS  1. Left ventricular ejection fraction, by estimation, is 60 to 65%. The left ventricle has normal function. The left ventricle has no regional wall motion abnormalities. Left ventricular diastolic parameters are consistent with Grade I diastolic dysfunction (impaired relaxation).  2. Right ventricular systolic function is mildly reduced. The right ventricular size is moderately enlarged. There is mildly elevated pulmonary artery systolic pressure. The estimated right ventricular systolic pressure is 36.3 mmHg.  3. Right atrial size was mildly dilated.  4. Large pleural effusion.  5. The mitral valve is degenerative. Mild mitral valve regurgitation. No evidence of mitral stenosis.  6. The aortic valve is tricuspid. Aortic valve regurgitation is not visualized. No aortic stenosis is present.  7. The inferior vena cava is dilated in size with >50% respiratory variability, suggesting right atrial pressure of 8 mmHg. FINDINGS  Left Ventricle: Left ventricular ejection fraction, by estimation, is 60 to 65%. The left ventricle has normal function. The left ventricle has no regional wall motion abnormalities. The left ventricular internal cavity size was normal in size. There is  no left ventricular hypertrophy. Left ventricular diastolic parameters are consistent with Grade I diastolic dysfunction (impaired relaxation). Indeterminate filling pressures. Right Ventricle: The right ventricular size is moderately enlarged. No increase in right ventricular wall thickness. Right ventricular  systolic function is mildly reduced. There is mildly elevated pulmonary artery systolic pressure. The tricuspid regurgitant velocity is 2.66 m/s, and with an assumed right atrial pressure of 8 mmHg, the estimated right ventricular systolic pressure is 36.3 mmHg. Left Atrium: Left atrial size was normal in size.  Right Atrium: Right atrial size was mildly dilated. Pericardium: There is no evidence of pericardial effusion. Mitral Valve: The mitral valve is degenerative in appearance. Mild mitral valve regurgitation. No evidence of mitral valve stenosis. Tricuspid Valve: The tricuspid valve is normal in structure. Tricuspid valve regurgitation is mild . No evidence of tricuspid stenosis. Aortic Valve: The aortic valve is tricuspid. Aortic valve regurgitation is not visualized. No aortic stenosis is present. Pulmonic Valve: The pulmonic valve was normal in structure. Pulmonic valve regurgitation is not visualized. No evidence of pulmonic stenosis. Aorta: The aortic root and ascending aorta are structurally normal, with no evidence of dilitation. Venous: The inferior vena cava is dilated in size with greater than 50% respiratory variability, suggesting right atrial pressure of 8 mmHg. IAS/Shunts: No atrial level shunt detected by color flow Doppler. Additional Comments: There is a large pleural effusion.  LEFT VENTRICLE PLAX 2D LVIDd:         4.30 cm     Diastology LVIDs:         2.70 cm     LV e' medial:    5.22 cm/s LV PW:         0.90 cm     LV E/e' medial:  14.0 LV IVS:        0.80 cm     LV e' lateral:   6.22 cm/s LVOT diam:     1.90 cm     LV E/e' lateral: 11.7 LV SV:         56 LV SV Index:   32 LVOT Area:     2.84 cm  LV Volumes (MOD) LV vol d, MOD A2C: 48.9 ml LV vol d, MOD A4C: 58.4 ml LV vol s, MOD A2C: 19.0 ml LV vol s, MOD A4C: 22.0 ml LV SV MOD A2C:     29.9 ml LV SV MOD A4C:     58.4 ml LV SV MOD BP:      34.7 ml RIGHT VENTRICLE            IVC RV S prime:     9.70 cm/s  IVC diam: 2.20 cm TAPSE (M-mode):  1.8 cm LEFT ATRIUM           Index        RIGHT ATRIUM           Index LA diam:      2.70 cm 1.53 cm/m   RA Area:     14.90 cm LA Vol (A2C): 14.6 ml 8.25 ml/m   RA Volume:   40.90 ml  23.10 ml/m LA Vol (A4C): 24.4 ml 13.78 ml/m  AORTIC VALVE LVOT Vmax:   99.20 cm/s LVOT Vmean:  65.400 cm/s LVOT VTI:    0.197 m  AORTA Ao Root diam: 3.40 cm Ao Asc diam:  3.10 cm MITRAL VALVE               TRICUSPID VALVE MV Area (PHT): 3.85 cm    TR Peak grad:   28.3 mmHg MV Decel Time: 197 msec    TR Vmax:        266.00 cm/s MV E velocity: 73.00 cm/s MV A velocity: 90.70 cm/s  SHUNTS MV E/A ratio:  0.80        Systemic VTI:  0.20 m                            Systemic Diam: 1.90 cm Maudine Sos MD  Electronically signed by Maudine Sos MD Signature Date/Time: 08/14/2023/2:48:30 PM    Final    DG Chest Port 1 View Result Date: 08/11/2023 EXAM: 1 VIEW XRAY OF THE CHEST 08/11/2023 12:30:00 PM COMPARISON: None available. CLINICAL HISTORY: Shortness of breath for 5 days. FINDINGS: LUNGS AND PLEURA: No focal pulmonary opacity. No pulmonary edema. No pleural effusion. No pneumothorax. HEART AND MEDIASTINUM: Borderline cardiac enlargement is exaggerated by low lung volumes. BONES AND SOFT TISSUES: No acute osseous abnormality. IMPRESSION: 1. No acute findings. 2. Borderline cardiac enlargement, exaggerated by low lung volumes. Electronically signed by: Audree Leas MD 08/11/2023 12:41 PM EDT RP Workstation: [id number]    Microbiology: Results for orders placed or performed during the hospital encounter of 08/11/23  Resp panel by RT-PCR (RSV, Flu A&B, Covid) Anterior Nasal Swab     Status: None   Collection Time: 08/11/23 11:24 AM   Specimen: Anterior Nasal Swab  Result Value Ref Range Status   SARS Coronavirus 2 by RT PCR NEGATIVE NEGATIVE Final    Comment: (NOTE) SARS-CoV-2 target nucleic acids are NOT DETECTED.  The SARS-CoV-2 RNA is generally detectable in upper respiratory specimens during the acute  phase of infection. The lowest concentration of SARS-CoV-2 viral copies this assay can detect is 138 copies/mL. A negative result does not preclude SARS-Cov-2 infection and should not be used as the sole basis for treatment or other patient management decisions. A negative result may occur with  improper specimen collection/handling, submission of specimen other than nasopharyngeal swab, presence of viral mutation(s) within the areas targeted by this assay, and inadequate number of viral copies(<138 copies/mL). A negative result must be combined with clinical observations, patient history, and epidemiological information. The expected result is Negative.  Fact Sheet for Patients:  BloggerCourse.com  Fact Sheet for Healthcare Providers:  SeriousBroker.it  This test is no t yet approved or cleared by the [place]  FDA and  has been authorized for detection and/or diagnosis of SARS-CoV-2 by FDA under an Emergency Use Authorization (EUA). This EUA will remain  in effect (meaning this test can be used) for the duration of the COVID-19 declaration under Section 564(b)(1) of the Act, 21 U.S.C.section 360bbb-3(b)(1), unless the authorization is terminated  or revoked sooner.       Influenza A by PCR NEGATIVE NEGATIVE Final   Influenza B by PCR NEGATIVE NEGATIVE Final    Comment: (NOTE) The Xpert Xpress SARS-CoV-2/FLU/RSV plus assay is intended as an aid in the diagnosis of influenza from Nasopharyngeal swab specimens and should not be used as a sole basis for treatment. Nasal washings and aspirates are unacceptable for Xpert Xpress SARS-CoV-2/FLU/RSV testing.  Fact Sheet for Patients: BloggerCourse.com  Fact Sheet for Healthcare Providers: SeriousBroker.it  This test is not yet approved or cleared by the [place]  FDA and has been authorized for detection and/or diagnosis of  SARS-CoV-2 by FDA under an Emergency Use Authorization (EUA). This EUA will remain in effect (meaning this test can be used) for the duration of the COVID-19 declaration under Section 564(b)(1) of the Act, 21 U.S.C. section 360bbb-3(b)(1), unless the authorization is terminated or revoked.     Resp Syncytial Virus by PCR NEGATIVE NEGATIVE Final    Comment: (NOTE) Fact Sheet for Patients: BloggerCourse.com  Fact Sheet for Healthcare Providers: SeriousBroker.it  This test is not yet approved or cleared by the [place]  FDA and has been authorized for detection and/or diagnosis of SARS-CoV-2 by FDA under an Emergency Use Authorization (EUA). This EUA will remain in effect (  meaning this test can be used) for the duration of the COVID-19 declaration under Section 564(b)(1) of the Act, 21 U.S.C. section 360bbb-3(b)(1), unless the authorization is terminated or revoked.  Performed at [facility], [address], [place], [place] [address]   Resp panel by RT-PCR (RSV, Flu A&B, Covid) Anterior Nasal Swab     Status: None   Collection Time: 08/11/23  3:45 PM   Specimen: Anterior Nasal Swab  Result Value Ref Range Status   SARS Coronavirus 2 by RT PCR NEGATIVE NEGATIVE Final    Comment: (NOTE) SARS-CoV-2 target nucleic acids are NOT DETECTED.  The SARS-CoV-2 RNA is generally detectable in upper respiratory specimens during the acute phase of infection. The lowest concentration of SARS-CoV-2 viral copies this assay can detect is 138 copies/mL. A negative result does not preclude SARS-Cov-2 infection and should not be used as the sole basis for treatment or other patient management decisions. A negative result may occur with  improper specimen collection/handling, submission of specimen other than nasopharyngeal swab, presence of viral mutation(s) within the areas targeted by this assay, and inadequate number of  viral copies(<138 copies/mL). A negative result must be combined with clinical observations, patient history, and epidemiological information. The expected result is Negative.  Fact Sheet for Patients:  BloggerCourse.com  Fact Sheet for Healthcare Providers:  SeriousBroker.it  This test is no t yet approved or cleared by the [place]  FDA and  has been authorized for detection and/or diagnosis of SARS-CoV-2 by FDA under an Emergency Use Authorization (EUA). This EUA will remain  in effect (meaning this test can be used) for the duration of the COVID-19 declaration under Section 564(b)(1) of the Act, 21 U.S.C.section 360bbb-3(b)(1), unless the authorization is terminated  or revoked sooner.       Influenza A by PCR NEGATIVE NEGATIVE Final   Influenza B by PCR NEGATIVE NEGATIVE Final    Comment: (NOTE) The Xpert Xpress SARS-CoV-2/FLU/RSV plus assay is intended as an aid in the diagnosis of influenza from Nasopharyngeal swab specimens and should not be used as a sole basis for treatment. Nasal washings and aspirates are unacceptable for Xpert Xpress SARS-CoV-2/FLU/RSV testing.  Fact Sheet for Patients: BloggerCourse.com  Fact Sheet for Healthcare Providers: SeriousBroker.it  This test is not yet approved or cleared by the [place]  FDA and has been authorized for detection and/or diagnosis of SARS-CoV-2 by FDA under an Emergency Use Authorization (EUA). This EUA will remain in effect (meaning this test can be used) for the duration of the COVID-19 declaration under Section 564(b)(1) of the Act, 21 U.S.C. section 360bbb-3(b)(1), unless the authorization is terminated or revoked.     Resp Syncytial Virus by PCR NEGATIVE NEGATIVE Final    Comment: (NOTE) Fact Sheet for Patients: BloggerCourse.com  Fact Sheet for Healthcare  Providers: SeriousBroker.it  This test is not yet approved or cleared by the [place]  FDA and has been authorized for detection and/or diagnosis of SARS-CoV-2 by FDA under an Emergency Use Authorization (EUA). This EUA will remain in effect (meaning this test can be used) for the duration of the COVID-19 declaration under Section 564(b)(1) of the Act, 21 U.S.C. section 360bbb-3(b)(1), unless the authorization is terminated or revoked.  Performed at [facility], [address], [place], [place] [address]   MRSA Next Gen by PCR, Nasal     Status: None   Collection Time: 08/11/23  3:45 PM   Specimen: Nasal Mucosa; Nasal Swab  Result Value Ref Range Status  MRSA by PCR Next Gen NOT DETECTED NOT DETECTED Final    Comment: (NOTE) The GeneXpert MRSA Assay (FDA approved for NASAL specimens only), is one component of a comprehensive MRSA colonization surveillance program. It is not intended to diagnose MRSA infection nor to guide or monitor treatment for MRSA infections. Test performance is not FDA approved in patients less than 3 years old. Performed at [facility], [address] [place], [place] [address]   Acid Fast Smear (AFB)     Status: None   Collection Time: 08/12/23  2:52 PM   Specimen: Sputum  Result Value Ref Range Status   AFB Specimen Processing Concentration  Final   Acid Fast Smear Negative  Final    Comment: (NOTE) Performed At: [facility] [address] [place], [place] [id number] Pearlean Botts MD [id number]    Source (AFB) SPUTUM  Final    Comment: Performed at [facility], [address] [place], [place] [address]  Acid Fast Smear (AFB)     Status: None   Collection Time: 08/13/23  2:28 PM   Specimen: Sputum  Result Value Ref Range Status   AFB Specimen Processing Concentration  Final   Acid Fast Smear Negative  Final    Comment: (NOTE) Performed At:  [facility] [address] [place], [place] [id number] Pearlean Botts MD [id number]    Source (AFB) SPUTUM  Final    Comment: Performed at [facility], [address], [place], [place] [address]    Labs: CBC: Recent Labs  Lab 08/11/23 1142 08/12/23 0351 08/13/23 0329 08/14/23 0354 08/15/23 0346 08/16/23 0326  WBC 6.2 6.1 8.8 7.9 9.8 5.8  NEUTROABS 5.1  --   --  6.9  --   --   HGB 12.8 12.3 12.4 13.3 14.4 13.1  HCT 40.3 38.8 38.6 41.3 44.4 39.8  MCV 92.2 93.0 90.8 91.0 91.0 88.2  PLT 115* 141* 156 176 204 162   Basic Metabolic Panel: Recent Labs  Lab 08/11/23 1142 08/12/23 0351 08/13/23 0329 08/14/23 0354 08/14/23 1314 08/15/23 0346 08/16/23 0326  NA 136 133* 127* 129* 127* 126* 130*  K 3.2* 5.0 4.4 5.0  --  3.9 3.8  CL 98 96* 93* 90*  --  85* 93*  CO2 27 27 25 30   --  33* 31  GLUCOSE 216* 137* 129* 125*  --  92 96  BUN 13 17 20 21   --  25* 24*  CREATININE 0.78 0.80 0.72 0.74  --  0.83 0.73  CALCIUM 8.5* 9.0 8.4* 9.0  --  8.9 8.6*  MG 1.7  --  1.9 1.9  --   --   --   PHOS  --   --   --  3.6  --   --   --    Liver Function Tests: Recent Labs  Lab 08/11/23 1142 08/14/23 0354 08/15/23 0346 08/16/23 0326  AST 25  --  44* 30  ALT 17  --  30 29  ALKPHOS 64  --  61 55  BILITOT 0.5  --  0.8 0.7  PROT 6.5  --  7.1 6.1*  ALBUMIN 3.6 3.7 3.8 3.3*   CBG: No results for input(s): "GLUCAP" in the last 168 hours.  Discharge time spent: less than 30 minutes.  Signed: Cherylle Corwin, MD [facility] 08/16/2023

## 2023-08-16 NOTE — TOC Transition Note (Signed)
 Transition of Care [facility]) - Discharge Note   Patient Details  Name: Aimee Brown MRN: [id number] Date of Birth: 26-Sep-1940  [facility] of [facility]) CM/SW Contact:  Bari Leys, RN Phone Number: 08/16/2023, 1:29 PM   Clinical Narrative:  DC to Home, order for Home 02, [employer], rep-Jermaine accepted, will deliver to bedside before delivery. No further TOC needs.       Final next level of care: Home/Self Care Barriers to Discharge: Barriers Resolved   Patient Goals and CMS Choice Patient states their goals for this hospitalization and ongoing recovery are:: return home          Discharge Placement                       Discharge Plan and Services Additional resources added to the After Visit Summary for                  DME Arranged: Oxygen DME Agency: [employer] Date DME Agency Contacted: 08/16/23 Time DME Agency Contacted: 1329 Representative spoke with at DME Agency: Zula Hitch            Social Drivers of Health (SDOH) Interventions SDOH Screenings   Food Insecurity: No Food Insecurity (08/11/2023)  Housing: Low Risk  (08/11/2023)  Transportation Needs: No Transportation Needs (08/11/2023)  Utilities: Not At Risk (08/11/2023)  Social Connections: Moderately Integrated (08/11/2023)  Tobacco Use: Low Risk  (08/13/2023)     Readmission Risk Interventions    08/12/2023    9:48 AM  Readmission Risk Prevention Plan  Post Dischage Appt Complete  Medication Screening Complete  Transportation Screening Complete

## 2023-08-20 ENCOUNTER — Ambulatory Visit: Payer: Self-pay

## 2023-08-20 NOTE — Telephone Encounter (Signed)
 Copied from CRM [id number]. Topic: Clinical - Red Word Triage >> Aug 20, 2023 10:55 AM Juliana Ocean wrote: Red Word that prompted transfer to Nurse Triage: pt got home late Friday night from a week stay w/ dx of PNA. Pt needs hospital fup.  Ok w/ a PA if dr not available. Pt came home w/ O2, still having breathig issues  E2C2 Pulmonary Triage - Initial Assessment Questions "Chief Complaint (e.g., cough, sob, wheezing, fever, chills, sweat or additional symptoms) *Go to specific symptom protocol after initial questions. Patient is requesting a hospital follow up appointment.   Triage call received on 08-20-2023 Patient/caregiver reports concerns of Appointment Scheduling  Assessment conducted based on subjective report and clinical judgment within nursing scope.  Patient advised based on reported symptoms, risk factors, and urgency of presentation.   Reason for Disposition  Requesting regular office appointment    Hospital follow up appointment  Answer Assessment - Initial Assessment Questions 1. REASON FOR CALL or QUESTION: "What is your reason for calling today?" or "How can I best help you?" or "What question do you have that I can help answer?"     Hospital Follow Up Appointment, No symptoms  Protocols used: Information Only Call - No Triage-A-AH

## 2023-08-20 NOTE — Telephone Encounter (Signed)
 Pt was scheduled for 08/26/23 with Dr. Gaynell Keeler.

## 2023-08-26 ENCOUNTER — Encounter: Payer: Self-pay | Admitting: Pulmonary Disease

## 2023-08-26 ENCOUNTER — Ambulatory Visit: Admitting: Pulmonary Disease

## 2023-08-26 VITALS — BP 124/73 | HR 72 | Ht 63.0 in | Wt 156.2 lb

## 2023-08-26 DIAGNOSIS — J449 Chronic obstructive pulmonary disease, unspecified: Secondary | ICD-10-CM | POA: Diagnosis not present

## 2023-08-26 DIAGNOSIS — J479 Bronchiectasis, uncomplicated: Secondary | ICD-10-CM | POA: Diagnosis not present

## 2023-08-26 DIAGNOSIS — R053 Chronic cough: Secondary | ICD-10-CM | POA: Diagnosis not present

## 2023-08-26 DIAGNOSIS — J9611 Chronic respiratory failure with hypoxia: Secondary | ICD-10-CM

## 2023-08-26 DIAGNOSIS — E785 Hyperlipidemia, unspecified: Secondary | ICD-10-CM | POA: Diagnosis not present

## 2023-08-26 DIAGNOSIS — M109 Gout, unspecified: Secondary | ICD-10-CM | POA: Diagnosis not present

## 2023-08-26 DIAGNOSIS — J9621 Acute and chronic respiratory failure with hypoxia: Secondary | ICD-10-CM | POA: Diagnosis not present

## 2023-08-26 DIAGNOSIS — E871 Hypo-osmolality and hyponatremia: Secondary | ICD-10-CM | POA: Diagnosis not present

## 2023-08-26 DIAGNOSIS — K219 Gastro-esophageal reflux disease without esophagitis: Secondary | ICD-10-CM | POA: Diagnosis not present

## 2023-08-26 DIAGNOSIS — E039 Hypothyroidism, unspecified: Secondary | ICD-10-CM | POA: Diagnosis not present

## 2023-08-26 DIAGNOSIS — I10 Essential (primary) hypertension: Secondary | ICD-10-CM | POA: Diagnosis not present

## 2023-08-26 DIAGNOSIS — I119 Hypertensive heart disease without heart failure: Secondary | ICD-10-CM | POA: Diagnosis not present

## 2023-08-26 MED ORDER — SODIUM CHLORIDE 3 % IN NEBU: INHALATION_SOLUTION | RESPIRATORY_TRACT | 3 refills | Status: AC | PRN

## 2023-08-26 NOTE — Progress Notes (Addendum)
 Aimee Brown    [id number]    1940-07-12  Primary Care Physician:Aronson, Rich Champ, MD  Referring Physician: Suan Elm, MD [address] [name],  [place] [address]  Chief complaint:   Follow-up for recent hospitalization  HPI:  Patient with a history of bronchiectasis, recently hospitalized Treated for pneumonia with a course of Augmentin   Feeling a lot better Trying to get more active  She is on oxygen Being on oxygen as been helpful, she can usually walk more and exert herself more  May be able to walk quarter of a mile when she takes a time  She still does get short of breath with exertion  She does have a cough brings up clear phlegm No fevers no chills No night sweats No weight loss  Never smoked but was exposed to secondhand smoke in a work environment, dad also smoked  She is on Anoro, uses a nebulizer twice a day with albuterol   Outpatient Encounter Medications as of 08/26/2023  Medication Sig   albuterol  (PROVENTIL ) (2.5 MG/3ML) 0.083% nebulizer solution USE 1 VIAL VIA NEBULIZER EVERY 6 HOURS AS NEEDED FOR WHEEZING OR SHORTNESS OF BREATH   albuterol  (VENTOLIN  HFA) 108 (90 Base) MCG/ACT inhaler TAKE 2 PUFFS BY MOUTH EVERY 6 HOURS AS NEEDED FOR WHEEZE OR SHORTNESS OF BREATH (Patient taking differently: Inhale 2 puffs into the lungs every 6 (six) hours as needed for wheezing or shortness of breath.)   amLODipine  (NORVASC ) 10 MG tablet Take 1 tablet (10 mg total) by mouth daily.   CEQUA  0.09 % SOLN Place 1 drop into both eyes in the morning and at bedtime.   Cholecalciferol (VITAMIN D3 PO) Take 1 capsule by mouth daily.   guaiFENesin  (MUCINEX ) 600 MG 12 hr tablet Take 2 tablets (1,200 mg total) by mouth 2 (two) times daily.   hydroxychloroquine  (PLAQUENIL ) 200 MG tablet Take 200 mg by mouth 2 (two) times daily.   levothyroxine  (SYNTHROID ) 137 MCG tablet Take 137 mcg by mouth daily before breakfast.   omeprazole (PRILOSEC) 20 MG capsule Take 20  mg by mouth daily before breakfast.   promethazine -dextromethorphan (PROMETHAZINE -DM) 6.25-15 MG/5ML syrup Take 5 mLs by mouth 4 (four) times daily as needed for cough.   ramipril  (ALTACE ) 10 MG capsule Take 10 mg by mouth daily.   sertraline  (ZOLOFT ) 50 MG tablet Take 50 mg by mouth daily.   TYLENOL  500 MG tablet Take 500-1,000 mg by mouth every 6 (six) hours as needed for mild pain (pain score 1-3) or headache.   umeclidinium-vilanterol (ANORO ELLIPTA ) 62.5-25 MCG/ACT AEPB INHALE 1 PUFF INTO LUNGS EVERY DAY   No facility-administered encounter medications on file as of 08/26/2023.    Allergies as of 08/26/2023   (No Known Allergies)    Past Medical History:  Diagnosis Date   Depression    Hypercholesteremia    Hypertension    Long-term use of high-risk medication 03/07/2020    Past Surgical History:  Procedure Laterality Date   ABDOMINAL HYSTERECTOMY     CERVICAL LAMINECTOMY     CESAREAN SECTION     KIDNEY STONE SURGERY     LEFT HEART CATH AND CORONARY ANGIOGRAPHY N/A 07/10/2017   Procedure: LEFT HEART CATH AND CORONARY ANGIOGRAPHY;  Surgeon: Arleen Lacer, MD;  Location: [facility] INVASIVE CV LAB;  Service: Cardiovascular;  Laterality: N/A;   SALPINGOOPHORECTOMY     TONSILLECTOMY      Family History  Problem Relation Age of Onset   Breast  cancer Paternal Aunt    Breast cancer Maternal Grandmother    Heart disease Mother    Heart disease Father    Scleroderma Brother     Social History   Socioeconomic History   Marital status: Married    Spouse name: Not on file   Number of children: Not on file   Years of education: Not on file   Highest education level: Not on file  Occupational History   Not on file  Tobacco Use   Smoking status: Never    Passive exposure: Past   Smokeless tobacco: Never  Substance and Sexual Activity   Alcohol  use: No   Drug use: Never   Sexual activity: Not on file  Other Topics Concern   Not on file  Social History Narrative   Not on  file   Social Drivers of Health   Financial Resource Strain: Not on file  Food Insecurity: No Food Insecurity (08/11/2023)   Hunger Vital Sign    Worried About Running Out of Food in the Last Year: Never true    Ran Out of Food in the Last Year: Never true  Transportation Needs: No Transportation Needs (08/11/2023)   PRAPARE - Administrator, Civil Service (Medical): No    Lack of Transportation (Non-Medical): No  Physical Activity: Not on file  Stress: Not on file  Social Connections: Moderately Integrated (08/11/2023)   Social Connection and Isolation Panel [NHANES]    Frequency of Communication with Friends and Family: Three times a week    Frequency of Social Gatherings with Friends and Family: Twice a week    Attends Religious Services: More than 4 times per year    Active Member of [employer] or Organizations: No    Attends Banker Meetings: Never    Marital Status: Married  Catering manager Violence: Not At Risk (08/11/2023)   Humiliation, Afraid, Rape, and Kick questionnaire    Fear of Current or Ex-Partner: No    Emotionally Abused: No    Physically Abused: No    Sexually Abused: No    Review of Systems  Respiratory:  Positive for cough and shortness of breath.     Vitals:   08/26/23 1349  BP: 124/73  Pulse: 72  SpO2: 92%     Physical Exam Constitutional:      Appearance: Normal appearance.  HENT:     Head: Normocephalic.     Nose: Nose normal.     Mouth/Throat:     Mouth: Mucous membranes are moist.  Eyes:     General: No scleral icterus. Cardiovascular:     Rate and Rhythm: Normal rate and regular rhythm.     Heart sounds: No murmur heard.    No friction rub.  Pulmonary:     Effort: No respiratory distress.     Breath sounds: No stridor. No wheezing or rhonchi.  Musculoskeletal:     Cervical back: No rigidity or tenderness.  Neurological:     Mental Status: She is alert.  Psychiatric:        Mood and Affect: Mood normal.     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  Assessment:  History of bronchiectasis  Chronic respiratory failure on oxygen supplementation at 2 L  Recent exacerbation of bronchiectasis - Symptoms are  improving  Obstructive lung disease  History of hypertension  Plan/Recommendations:  Prescription for hypertonic saline to be used twice a day  Continue graded activities as tolerated  Continue oxygen supplementation  Obtain ambulatory oximetry to ascertain desaturations with POC device  Disability placard  Follow-up in 3 months   Myer Artis MD [facility] 08/26/2023, 1:53 PM  CC: Suan Elm, MD  Gaylon Kea, CMA Mon Aug 26, 2023  2:58 PM   SATURATION QUALIFICATIONS: (This note is used to comply with regulatory documentation for home oxygen)   Patient Saturations on 2L POC at Rest = 92%   Patient Saturations on 3L POC while Ambulating = 88%   Patient Saturations on 4 Liters of oxygen while Ambulating = 89%   Please briefly explain why patient needs home oxygen:   Patient on poc only maintained at 88-90 on 4L

## 2023-08-26 NOTE — Patient Instructions (Addendum)
 We will check your oxygen on ambulation  Continue your inhalers  Will send in a prescription for hypertonic saline to be used twice a day - This will help with secretion clearance  Disability placard forms  Continue to try to stay active  Check your oxygen periodically, you want to make sure that your saturations are staying above 90%  Follow-up in about 3 months

## 2023-09-02 DIAGNOSIS — J449 Chronic obstructive pulmonary disease, unspecified: Secondary | ICD-10-CM | POA: Diagnosis not present

## 2023-09-02 DIAGNOSIS — E039 Hypothyroidism, unspecified: Secondary | ICD-10-CM | POA: Diagnosis not present

## 2023-09-02 DIAGNOSIS — Z1331 Encounter for screening for depression: Secondary | ICD-10-CM | POA: Diagnosis not present

## 2023-09-02 DIAGNOSIS — I10 Essential (primary) hypertension: Secondary | ICD-10-CM | POA: Diagnosis not present

## 2023-09-02 DIAGNOSIS — A310 Pulmonary mycobacterial infection: Secondary | ICD-10-CM | POA: Diagnosis not present

## 2023-09-02 DIAGNOSIS — R82998 Other abnormal findings in urine: Secondary | ICD-10-CM | POA: Diagnosis not present

## 2023-09-02 DIAGNOSIS — M3500 Sicca syndrome, unspecified: Secondary | ICD-10-CM | POA: Diagnosis not present

## 2023-09-02 DIAGNOSIS — J189 Pneumonia, unspecified organism: Secondary | ICD-10-CM | POA: Diagnosis not present

## 2023-09-02 DIAGNOSIS — Z1339 Encounter for screening examination for other mental health and behavioral disorders: Secondary | ICD-10-CM | POA: Diagnosis not present

## 2023-09-02 DIAGNOSIS — F32A Depression, unspecified: Secondary | ICD-10-CM | POA: Diagnosis not present

## 2023-09-02 DIAGNOSIS — J9621 Acute and chronic respiratory failure with hypoxia: Secondary | ICD-10-CM | POA: Diagnosis not present

## 2023-09-02 DIAGNOSIS — Z0000 Encounter for general adult medical examination without abnormal findings: Secondary | ICD-10-CM | POA: Diagnosis not present

## 2023-09-16 DIAGNOSIS — K219 Gastro-esophageal reflux disease without esophagitis: Secondary | ICD-10-CM | POA: Diagnosis not present

## 2023-09-16 DIAGNOSIS — E871 Hypo-osmolality and hyponatremia: Secondary | ICD-10-CM | POA: Diagnosis not present

## 2023-09-16 DIAGNOSIS — J9601 Acute respiratory failure with hypoxia: Secondary | ICD-10-CM | POA: Diagnosis not present

## 2023-09-16 DIAGNOSIS — F32A Depression, unspecified: Secondary | ICD-10-CM | POA: Diagnosis not present

## 2023-09-18 ENCOUNTER — Ambulatory Visit: Admitting: Pulmonary Disease

## 2023-09-18 ENCOUNTER — Encounter: Payer: Self-pay | Admitting: Pulmonary Disease

## 2023-09-18 VITALS — BP 132/77 | HR 81 | Ht 63.0 in | Wt 157.0 lb

## 2023-09-18 DIAGNOSIS — J449 Chronic obstructive pulmonary disease, unspecified: Secondary | ICD-10-CM

## 2023-09-18 DIAGNOSIS — J479 Bronchiectasis, uncomplicated: Secondary | ICD-10-CM | POA: Diagnosis not present

## 2023-09-18 NOTE — Patient Instructions (Signed)
 Nice to meet you  Let stop the saline nebulizers for now given it dries out your mouth so much  Continue using the flutter valve  Continue Anoro  No other changes to medication  Will see if you qualify for portable oxygen concentrator today  Return to clinic in 3 months or sooner as needed with Dr. Annella

## 2023-09-18 NOTE — Progress Notes (Signed)
 @Patient  ID: Aimee Brown, female    DOB: 01-26-1941, 83 y.o.   MRN: [id number]  Chief Complaint  Patient presents with   Follow-up    Referring provider: Shepard Ade, MD  HPI:   83 y.o. woman with history of bronchiectasis and likely asthma here to establish care. Multiple prior pulmonary notes reviewed.  Discharge summary 07/2022 reviewed.  Here to establish care previously seen by Dr. Alaine and  Dr. Brenna.  Seen by Dr. Geronimo for acute visit.  Seen by Dr. Neda last month for hospital follow-up.  Unclear who she is going to be following up with the future.  She decided to see me.  She was told Dr. Harriet patients would be seen by me.  Never informed of this.  But certainly happy to take on take over care.  Overall symptoms cough dyspnea on exertion stable.  Finds hypertonic saline nebulizers worsens dry mouth, concomitant Sjogren's.  Airway clearance okay with flutter valve.  Recent hospitalized with pneumonia.  Chest x-ray relatively clear maybe some hazy infiltrates.  Has been on oxygen since.  Desaturates to 80% at home with exertion.  Desaturated again today off oxygen, new order for POC planned today.  Questionaires / Pulmonary Flowsheets:   ACT:      No data to display          MMRC:     No data to display          Epworth:      No data to display          Tests:   FENO:  No results found for: NITRICOXIDE  PFT:    Latest Ref Rng & Units 04/17/2023    9:52 AM 08/19/2017    3:43 PM  PFT Results  FVC-Pre L 1.32  1.33   FVC-Predicted Pre % 54  48   FVC-Post L 1.42  1.45   FVC-Predicted Post % 59  52   Pre FEV1/FVC % % 64  72   Post FEV1/FCV % % 67  69   FEV1-Pre L 0.85  0.96   FEV1-Predicted Pre % 47  46   FEV1-Post L 0.96  1.00   DLCO uncorrected ml/min/mmHg 11.65  13.16   DLCO UNC% % 64  54   DLCO corrected ml/min/mmHg 11.65    DLCO COR %Predicted % 64    DLVA Predicted % 124  100   TLC L 4.22  5.47   TLC % Predicted % 86   108   RV % Predicted % 120  177   Personally viewed-severe fixed obstruction, air trapping present, DLCO moderately reduced, overall raw values stable and percentages improved 2019 compared to 2020  WALK:     08/26/2023    2:55 PM 08/26/2023    2:54 PM  SIX MIN WALK  Supplimental Oxygen during Test? (L/min) Yes Yes  O2 Flow Rate 3 L/min 2 L/min  Type Pulse Pulse  Tech Comments: kepy dropping put in comments the the liters of oxygen,only maintained around 88-89,after at rest on 4L maintained at 92%     Imaging: Personally reviewed and as per EMR and discussion in this note No results found.  Lab Results: Personally reviewed CBC    Component Value Date/Time   WBC 5.8 08/16/2023 0326   RBC 4.51 08/16/2023 0326   HGB 13.1 08/16/2023 0326   HCT 39.8 08/16/2023 0326   PLT 162 08/16/2023 0326   MCV 88.2 08/16/2023 0326   MCH 29.0 08/16/2023 0326  MCHC 32.9 08/16/2023 0326   RDW 13.3 08/16/2023 0326   LYMPHSABS 0.6 (L) 08/14/2023 0354   MONOABS 0.3 08/14/2023 0354   EOSABS 0.0 08/14/2023 0354   BASOSABS 0.0 08/14/2023 0354    BMET    Component Value Date/Time   NA 130 (L) 08/16/2023 0326   K 3.8 08/16/2023 0326   CL 93 (L) 08/16/2023 0326   CO2 31 08/16/2023 0326   GLUCOSE 96 08/16/2023 0326   BUN 24 (H) 08/16/2023 0326   CREATININE 0.73 08/16/2023 0326   CALCIUM 8.6 (L) 08/16/2023 0326   GFRNONAA >60 08/16/2023 0326    BNP    Component Value Date/Time   BNP 166.3 (H) 08/13/2023 1331    ProBNP No results found for: PROBNP  Specialty Problems       Pulmonary Problems   Exertional dyspnea - as anginal equivalent   Bronchiectasis without complication (HCC)   COPD (chronic obstructive pulmonary disease) (HCC)   COPD exacerbation (HCC)   Acute respiratory failure with hypoxia (HCC)    No Known Allergies  Immunization History  Administered Date(s) Administered   Fluad Quad(high Dose 65+) 12/29/2018   Fluzone Influenza virus vaccine,trivalent (IIV3),  split virus 01/13/2007, 12/12/2010, 01/01/2012, 12/26/2012, 03/19/2013   Influenza Inj Mdck Quad Pf 12/10/2016, 12/16/2017   Influenza, High Dose Seasonal PF 12/01/2019, 12/23/2021   Influenza, Mdck, Trivalent,PF 6+ MOS(egg free) 12/17/2015, 12/10/2016   Influenza, Quadrivalent, Recombinant, Inj, Pf 12/16/2017, 12/19/2018, 12/01/2019, 12/17/2020, 12/29/2022   Influenza,inj,Quad PF,6+ Mos 12/26/2012, 12/22/2013, 12/25/2014   Influenza-Unspecified 12/16/2016   Moderna SARS-COV2 Booster Vaccination 11/26/2022   PFIZER(Purple Top)SARS-COV-2 Vaccination 04/03/2019, 04/24/2019, 12/14/2019   PNEUMOCOCCAL CONJUGATE-20 09/03/2022   Pneumococcal Conjugate-13 04/22/2013   Pneumococcal Polysaccharide-23 12/10/2005, 04/22/2013, 05/04/2015, 09/03/2022   Respiratory Syncytial Virus Vaccine,Recomb Aduvanted(Arexvy) 03/25/2023   Td (Adult) 12/24/2018   Td (Adult),5 Lf Tetanus Toxid, Preservative Free 01/13/2007   Zoster, Live 01/26/2008    Past Medical History:  Diagnosis Date   Depression    Hypercholesteremia    Hypertension    Long-term use of high-risk medication 03/07/2020    Tobacco History: Social History   Tobacco Use  Smoking Status Never   Passive exposure: Past  Smokeless Tobacco Never   Counseling given: Not Answered   Continue to not smoke  Outpatient Encounter Medications as of 09/18/2023  Medication Sig   albuterol  (PROVENTIL ) (2.5 MG/3ML) 0.083% nebulizer solution USE 1 VIAL VIA NEBULIZER EVERY 6 HOURS AS NEEDED FOR WHEEZING OR SHORTNESS OF BREATH   albuterol  (VENTOLIN  HFA) 108 (90 Base) MCG/ACT inhaler TAKE 2 PUFFS BY MOUTH EVERY 6 HOURS AS NEEDED FOR WHEEZE OR SHORTNESS OF BREATH (Patient taking differently: Inhale 2 puffs into the lungs every 6 (six) hours as needed for wheezing or shortness of breath.)   amLODipine  (NORVASC ) 10 MG tablet Take 1 tablet (10 mg total) by mouth daily.   CEQUA  0.09 % SOLN Place 1 drop into both eyes in the morning and at bedtime.    Cholecalciferol (VITAMIN D3 PO) Take 1 capsule by mouth daily.   guaiFENesin  (MUCINEX ) 600 MG 12 hr tablet Take 2 tablets (1,200 mg total) by mouth 2 (two) times daily.   hydroxychloroquine  (PLAQUENIL ) 200 MG tablet Take 200 mg by mouth 2 (two) times daily.   levothyroxine  (SYNTHROID ) 137 MCG tablet Take 137 mcg by mouth daily before breakfast.   omeprazole (PRILOSEC) 20 MG capsule Take 20 mg by mouth daily before breakfast.   promethazine -dextromethorphan (PROMETHAZINE -DM) 6.25-15 MG/5ML syrup Take 5 mLs by mouth 4 (four) times daily  as needed for cough.   ramipril  (ALTACE ) 10 MG capsule Take 10 mg by mouth daily.   sertraline  (ZOLOFT ) 50 MG tablet Take 50 mg by mouth daily.   sodium chloride  HYPERTONIC 3 % nebulizer solution Take by nebulization as needed for other. 4 cc nebulized twice a day   TYLENOL  500 MG tablet Take 500-1,000 mg by mouth every 6 (six) hours as needed for mild pain (pain score 1-3) or headache.   umeclidinium-vilanterol (ANORO ELLIPTA ) 62.5-25 MCG/ACT AEPB INHALE 1 PUFF INTO LUNGS EVERY DAY   No facility-administered encounter medications on file as of 09/18/2023.     Review of Systems  Review of Systems  No chest pain exertion, orthopnea or PND.  Comprehensive review of systems otherwise negative. Physical Exam  BP 132/77 (BP Location: Left Arm, Patient Position: Sitting, Cuff Size: Large)   Pulse 81   Ht 5' 3 (1.6 m)   Wt 157 lb (71.2 kg)   SpO2 96%   BMI 27.81 kg/m   Wt Readings from Last 5 Encounters:  09/18/23 157 lb (71.2 kg)  08/26/23 156 lb 3.2 oz (70.9 kg)  08/16/23 157 lb 6.5 oz (71.4 kg)  05/29/23 165 lb (74.8 kg)  02/11/23 166 lb 6.4 oz (75.5 kg)    BMI Readings from Last 5 Encounters:  09/18/23 27.81 kg/m  08/26/23 27.67 kg/m  08/16/23 27.88 kg/m  05/29/23 29.23 kg/m  02/11/23 29.48 kg/m     Physical Exam General: Sitting in chair provide Eyes: EOMI, no icterus Neck: Supple, no JVP Pulmonary: Clear, no work of  breathing Cardiovascular: Regular rate and rhythm Abdomen: Nondistended MSK: No synovitis, no joint effusion Neuro: Normal gait, no weakness Psych: Normal mood, full affect   Assessment & Plan:   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  Pulmonary bibasilar.  Likely silent aspiration.  Continue airway clearance with flutter valve.  Hypertonic saline causes dry mouth later, and Sjogren's.  Agreed to stop that for now.  Consider alternative airway clearance via vest therapy in the future.  Continue albuterol  for airway clearance as well.  Acute hypoxemic respiratory failure: Following recent hospitalization for pneumonia.  Chest x-ray largely clear.  Qualify for POC today.  Continue oxygen supplementation.  New order for POC.  Hopeful to continue breathing at the future.  Risk factors for chronic hypoxemia include but appear to be possible pulmonary hypertension and obstructive lung disease related to bronchiectasis unlikely underlying asthma. On room air at rest oxygen saturation was 94%. With exertion, oxygen saturation was 83%. 3L Electra via pulsed portable oxygen concentrator was required to maintain adequate saturation at 95%.   COPD asthma overlap: continue Anoro.   Return in about 3 months (around 12/19/2023) for f/u Dr. Annella.   Donnice JONELLE Annella, MD 09/18/2023   This appointment required 41 minutes of patient care (this includes precharting, chart review, review of results, face-to-face care, etc.).

## 2023-09-26 ENCOUNTER — Telehealth: Payer: Self-pay

## 2023-09-26 LAB — ACID FAST CULTURE WITH REFLEXED SENSITIVITIES (MYCOBACTERIA)
Acid Fast Culture: NEGATIVE
Acid Fast Culture: NEGATIVE

## 2023-09-26 NOTE — Telephone Encounter (Signed)
 Copied from CRM [id number]. Topic: Clinical - Order For Equipment >> Sep 25, 2023  4:22 PM Celestine FALCON wrote: Reason for CRM: Pt stated she called [employer] and was told that there was not an order for DME for portable oxygen concentrator. Pt would like a call from Dr. Leatha nurse confirming the order has been placed with [employer]. [employer]'s email is [email or web address]. Pt's phone number is [phone] ok to leave a vm.  Called and spoke to patient. She stated that [employer] did not received the POC order.  According to patient's chart- order was placed 09/18/2023, however I do not see confirmation of order being received.    PCC's, can you assist with this?

## 2023-09-26 NOTE — Telephone Encounter (Signed)
 They have received the order they will contact her after it has been processed through insurance. LVM for pt informing her. NFN

## 2023-09-26 NOTE — Telephone Encounter (Signed)
 Order was placed and received confirmation on 7/2. Left VM with [employer] to advise. Will call again in am.

## 2023-09-30 DIAGNOSIS — M3500 Sicca syndrome, unspecified: Secondary | ICD-10-CM | POA: Diagnosis not present

## 2023-09-30 DIAGNOSIS — H0288B Meibomian gland dysfunction left eye, upper and lower eyelids: Secondary | ICD-10-CM | POA: Diagnosis not present

## 2023-09-30 DIAGNOSIS — H0288A Meibomian gland dysfunction right eye, upper and lower eyelids: Secondary | ICD-10-CM | POA: Diagnosis not present

## 2023-09-30 DIAGNOSIS — H04123 Dry eye syndrome of bilateral lacrimal glands: Secondary | ICD-10-CM | POA: Diagnosis not present

## 2023-09-30 DIAGNOSIS — Z961 Presence of intraocular lens: Secondary | ICD-10-CM | POA: Diagnosis not present

## 2023-09-30 DIAGNOSIS — H1711 Central corneal opacity, right eye: Secondary | ICD-10-CM | POA: Diagnosis not present

## 2023-10-08 ENCOUNTER — Other Ambulatory Visit: Payer: Self-pay | Admitting: Pulmonary Disease

## 2023-10-08 ENCOUNTER — Other Ambulatory Visit: Payer: Self-pay

## 2023-10-08 MED ORDER — UMECLIDINIUM-VILANTEROL 62.5-25 MCG/ACT IN AEPB: 1.0000 | INHALATION_SPRAY | Freq: Every day | RESPIRATORY_TRACT | 5 refills | Status: DC

## 2023-10-09 NOTE — Telephone Encounter (Signed)
 What the preferred LAMA/LABA is. Thanks!

## 2023-10-10 ENCOUNTER — Telehealth: Payer: Self-pay

## 2023-10-10 ENCOUNTER — Other Ambulatory Visit (HOSPITAL_COMMUNITY): Payer: Self-pay

## 2023-10-10 ENCOUNTER — Telehealth: Payer: Self-pay | Admitting: *Deleted

## 2023-10-10 NOTE — Telephone Encounter (Signed)
 Brand Anoro Ellipta  covered $47.00

## 2023-10-10 NOTE — Telephone Encounter (Signed)
 LVM with [employer] to return call in regards to this pt POC. Will inform as soon as hear back

## 2023-10-10 NOTE — Telephone Encounter (Signed)
 Copied from CRM [id number]. Topic: Clinical - Order For Equipment >> Sep 25, 2023  4:22 PM Aimee Brown wrote: Reason for CRM: Pt stated she called [employer] and was told that there was not an order for DME for portable oxygen  concentrator. Pt would like a call from Dr. Leatha nurse confirming the order has been placed with [employer]. [employer]'s email is [email or web address]. Pt's phone number is [phone] ok to leave a vm. >> Oct 10, 2023 10:53 AM Aimee Brown wrote: Patient states [employer] does not have an order for a portable oxygen  concentrator, the insurance approved machine and needs the order fax to them. Patient asked the office to contact [employer] phone number [phone] on this matter. Patient is frustrated, it's been about a month. Please advise and call patient back at [phone] to keep patient in the loop.    Phone note on the 7/10 order has been placed twice and the last note states that is was being processed by insurance I called patient to let her know after speaking with [employer] that they do have the order and tried to get in touch with her about it.      NFN-

## 2023-10-10 NOTE — Telephone Encounter (Signed)
*  Pulm  Pharmacy Patient Advocate Encounter   Received notification from RX Request Messages that prior authorization for ANORO ELLIPTA ) 62.5-25 MCG/ACT AEPB  is required/requested.   Insurance verification completed.   The patient is insured through [facility] ADVANTAGE/RX ADVANCE .   Per test claim: The current 30 day co-pay is, $47.00.  No PA needed at this time. This test claim was processed through [facility]- copay amounts may vary at other pharmacies due to pharmacy/plan contracts, or as the patient moves through the different stages of their insurance plan.

## 2023-10-10 NOTE — Telephone Encounter (Signed)
 Copied from CRM [id number]. Topic: Clinical - Order For Equipment >> Sep 25, 2023  4:22 PM Celestine FALCON wrote: Reason for CRM: Pt stated she called [employer] and was told that there was not an order for DME for portable oxygen  concentrator. Pt would like a call from Dr. Leatha nurse confirming the order has been placed with [employer]. [employer]'s email is [email or web address]. Pt's phone number is [phone] ok to leave a vm. >> Oct 10, 2023 10:53 AM Leila C wrote: Patient states [employer] does not have an order for a portable oxygen  concentrator, the insurance approved machine and needs the order fax to them. Patient asked the office to contact [employer] phone number [phone] on this matter. Patient is frustrated, it's been about a month. Please advise and call patient back at [phone] to keep patient in the loop.   I called and spoke with the pt  She states that she called [employer] and they never received the o2 order  I see that they received it though, according to referral notes  Routing to [facility]

## 2023-10-11 ENCOUNTER — Telehealth: Payer: Self-pay

## 2023-10-11 DIAGNOSIS — E871 Hypo-osmolality and hyponatremia: Secondary | ICD-10-CM | POA: Diagnosis not present

## 2023-10-11 DIAGNOSIS — K219 Gastro-esophageal reflux disease without esophagitis: Secondary | ICD-10-CM | POA: Diagnosis not present

## 2023-10-11 DIAGNOSIS — F32A Depression, unspecified: Secondary | ICD-10-CM | POA: Diagnosis not present

## 2023-10-11 DIAGNOSIS — J9601 Acute respiratory failure with hypoxia: Secondary | ICD-10-CM | POA: Diagnosis not present

## 2023-10-11 NOTE — Telephone Encounter (Signed)
 Spoke with patient this morning about her Rx and about [employer] and they did reach out to her and she is getting her POC     NFN-

## 2023-10-11 NOTE — Telephone Encounter (Signed)
 Noted informed patient     NFN-

## 2023-10-14 ENCOUNTER — Telehealth: Payer: Self-pay | Admitting: Pulmonary Disease

## 2023-10-14 NOTE — Telephone Encounter (Signed)
 Copied from CRM [id number]. Topic: General - Other >> Oct 14, 2023  2:42 PM Nathanel DEL wrote: Reason for CRM: pt states she just got a call asking her to c/b asap.  However, everything in the chart she has gone over w/ a nurse.  She thinks the name was Ronal Please advise

## 2023-10-14 NOTE — Telephone Encounter (Signed)
 LVM again with [employer] to try to get update on this order.

## 2023-10-14 NOTE — Telephone Encounter (Signed)
 LVM for pt to return call. [employer] will not have POC in stock until 8/1 or 8/8. They are currently out and get the shipments in on Fridays. Gave pt my direct line to call back to inform

## 2023-10-15 NOTE — Telephone Encounter (Signed)
 Spoke with pt she received POC last Friday. NFN

## 2023-10-16 DIAGNOSIS — K219 Gastro-esophageal reflux disease without esophagitis: Secondary | ICD-10-CM | POA: Diagnosis not present

## 2023-10-16 DIAGNOSIS — E871 Hypo-osmolality and hyponatremia: Secondary | ICD-10-CM | POA: Diagnosis not present

## 2023-10-16 DIAGNOSIS — J9601 Acute respiratory failure with hypoxia: Secondary | ICD-10-CM | POA: Diagnosis not present

## 2023-10-16 DIAGNOSIS — F32A Depression, unspecified: Secondary | ICD-10-CM | POA: Diagnosis not present

## 2023-11-04 DIAGNOSIS — D0472 Carcinoma in situ of skin of left lower limb, including hip: Secondary | ICD-10-CM | POA: Diagnosis not present

## 2023-11-04 DIAGNOSIS — D492 Neoplasm of unspecified behavior of bone, soft tissue, and skin: Secondary | ICD-10-CM | POA: Diagnosis not present

## 2023-11-04 DIAGNOSIS — L570 Actinic keratosis: Secondary | ICD-10-CM | POA: Diagnosis not present

## 2023-11-11 DIAGNOSIS — J9601 Acute respiratory failure with hypoxia: Secondary | ICD-10-CM | POA: Diagnosis not present

## 2023-11-11 DIAGNOSIS — F32A Depression, unspecified: Secondary | ICD-10-CM | POA: Diagnosis not present

## 2023-11-11 DIAGNOSIS — K219 Gastro-esophageal reflux disease without esophagitis: Secondary | ICD-10-CM | POA: Diagnosis not present

## 2023-11-13 DIAGNOSIS — M1991 Primary osteoarthritis, unspecified site: Secondary | ICD-10-CM | POA: Diagnosis not present

## 2023-11-13 DIAGNOSIS — R0609 Other forms of dyspnea: Secondary | ICD-10-CM | POA: Diagnosis not present

## 2023-11-13 DIAGNOSIS — Z6827 Body mass index (BMI) 27.0-27.9, adult: Secondary | ICD-10-CM | POA: Diagnosis not present

## 2023-11-13 DIAGNOSIS — M3501 Sicca syndrome with keratoconjunctivitis: Secondary | ICD-10-CM | POA: Diagnosis not present

## 2023-11-13 DIAGNOSIS — E663 Overweight: Secondary | ICD-10-CM | POA: Diagnosis not present

## 2023-11-13 DIAGNOSIS — I7300 Raynaud's syndrome without gangrene: Secondary | ICD-10-CM | POA: Diagnosis not present

## 2023-11-16 DIAGNOSIS — K219 Gastro-esophageal reflux disease without esophagitis: Secondary | ICD-10-CM | POA: Diagnosis not present

## 2023-11-16 DIAGNOSIS — F32A Depression, unspecified: Secondary | ICD-10-CM | POA: Diagnosis not present

## 2023-11-16 DIAGNOSIS — J9601 Acute respiratory failure with hypoxia: Secondary | ICD-10-CM | POA: Diagnosis not present

## 2023-11-16 DIAGNOSIS — E871 Hypo-osmolality and hyponatremia: Secondary | ICD-10-CM | POA: Diagnosis not present

## 2023-12-04 DIAGNOSIS — D0472 Carcinoma in situ of skin of left lower limb, including hip: Secondary | ICD-10-CM | POA: Diagnosis not present

## 2023-12-05 DIAGNOSIS — H31091 Other chorioretinal scars, right eye: Secondary | ICD-10-CM | POA: Diagnosis not present

## 2023-12-05 DIAGNOSIS — H43813 Vitreous degeneration, bilateral: Secondary | ICD-10-CM | POA: Diagnosis not present

## 2023-12-05 DIAGNOSIS — Z79899 Other long term (current) drug therapy: Secondary | ICD-10-CM | POA: Diagnosis not present

## 2023-12-05 DIAGNOSIS — H04123 Dry eye syndrome of bilateral lacrimal glands: Secondary | ICD-10-CM | POA: Diagnosis not present

## 2023-12-05 DIAGNOSIS — H179 Unspecified corneal scar and opacity: Secondary | ICD-10-CM | POA: Diagnosis not present

## 2023-12-12 DIAGNOSIS — J9601 Acute respiratory failure with hypoxia: Secondary | ICD-10-CM | POA: Diagnosis not present

## 2023-12-12 DIAGNOSIS — F32A Depression, unspecified: Secondary | ICD-10-CM | POA: Diagnosis not present

## 2023-12-12 DIAGNOSIS — K219 Gastro-esophageal reflux disease without esophagitis: Secondary | ICD-10-CM | POA: Diagnosis not present

## 2023-12-17 DIAGNOSIS — J9601 Acute respiratory failure with hypoxia: Secondary | ICD-10-CM | POA: Diagnosis not present

## 2023-12-17 DIAGNOSIS — F32A Depression, unspecified: Secondary | ICD-10-CM | POA: Diagnosis not present

## 2023-12-17 DIAGNOSIS — K219 Gastro-esophageal reflux disease without esophagitis: Secondary | ICD-10-CM | POA: Diagnosis not present

## 2023-12-17 DIAGNOSIS — E871 Hypo-osmolality and hyponatremia: Secondary | ICD-10-CM | POA: Diagnosis not present

## 2023-12-21 DIAGNOSIS — Z23 Encounter for immunization: Secondary | ICD-10-CM | POA: Diagnosis not present

## 2023-12-27 ENCOUNTER — Other Ambulatory Visit: Payer: Self-pay | Admitting: Internal Medicine

## 2023-12-27 DIAGNOSIS — Z1231 Encounter for screening mammogram for malignant neoplasm of breast: Secondary | ICD-10-CM

## 2023-12-30 ENCOUNTER — Ambulatory Visit: Admitting: Pulmonary Disease

## 2023-12-30 ENCOUNTER — Encounter: Payer: Self-pay | Admitting: Pulmonary Disease

## 2023-12-30 VITALS — BP 139/82 | HR 87 | Ht 63.0 in | Wt 151.0 lb

## 2023-12-30 DIAGNOSIS — J471 Bronchiectasis with (acute) exacerbation: Secondary | ICD-10-CM | POA: Diagnosis not present

## 2023-12-30 DIAGNOSIS — J4489 Other specified chronic obstructive pulmonary disease: Secondary | ICD-10-CM

## 2023-12-30 MED ORDER — AMOXICILLIN-POT CLAVULANATE 875-125 MG PO TABS
1.0000 | ORAL_TABLET | Freq: Two times a day (BID) | ORAL | 0 refills | Status: AC
Start: 2023-12-30 — End: 2024-01-09

## 2023-12-30 NOTE — Progress Notes (Signed)
 @Patient  ID: Aimee Brown, female    DOB: 02/03/1941, 83 y.o.   MRN: [id number]  Chief Complaint  Patient presents with   Medical Management of Chronic Issues    PT states cough x weeks, rash under breast line, not sleeping w/ O2 at night O2 is at 91-93 %     Referring provider: Shepard Ade, MD  HPI:   83 y.o. woman with history of bronchiectasis and likely asthma whom we are seeing for evaluation of the same.  Multiple telephone encounter notes from our office and interim reviewed.  Returns for routine follow-up.  Overall doing well.  No exacerbations since last visit.  Continues her oxygen  POC 2 L.  Keeps oxygen  saturation up when she exerts result.  At rest she keeps her oxygen  off and it stayed above 90.  She is not wearing oxygen  at night.  She does not like it.  We discussed overnight oximetry but she is not interested in using at night, it is uncomfortable and she does not like it.  Breathing overall stable.  Increased cough over the last 2 weeks.  Increased congestion productive cough.  No worsening dyspnea etc.  Discussed likely bronchiectasis exacerbation.  HPI at initial visit, Here to establish care previously seen by Dr. Alaine and  Dr. Brenna.  Seen by Dr. Geronimo for acute visit.  Seen by Dr. Neda last month for hospital follow-up.  Unclear who she is going to be following up with the future.  She decided to see me.  She was told Dr. Harriet patients would be seen by me.  Never informed of this.  But certainly happy to take on take over care.  Overall symptoms cough dyspnea on exertion stable.  Finds hypertonic saline nebulizers worsens dry mouth, concomitant Sjogren's.  Airway clearance okay with flutter valve.  Recent hospitalized with pneumonia.  Chest x-ray relatively clear maybe some hazy infiltrates.  Has been on oxygen  since.  Desaturates to 80% at home with exertion.  Desaturated again today off oxygen , new order for POC planned today.  Questionaires /  Pulmonary Flowsheets:   ACT:      No data to display          MMRC:     No data to display          Epworth:      No data to display          Tests:   FENO:  No results found for: NITRICOXIDE  PFT:    Latest Ref Rng & Units 04/17/2023    9:52 AM 08/19/2017    3:43 PM  PFT Results  FVC-Pre L 1.32  1.33   FVC-Predicted Pre % 54  48   FVC-Post L 1.42  1.45   FVC-Predicted Post % 59  52   Pre FEV1/FVC % % 64  72   Post FEV1/FCV % % 67  69   FEV1-Pre L 0.85  0.96   FEV1-Predicted Pre % 47  46   FEV1-Post L 0.96  1.00   DLCO uncorrected ml/min/mmHg 11.65  13.16   DLCO UNC% % 64  54   DLCO corrected ml/min/mmHg 11.65    DLCO COR %Predicted % 64    DLVA Predicted % 124  100   TLC L 4.22  5.47   TLC % Predicted % 86  108   RV % Predicted % 120  177   Personally viewed-severe fixed obstruction, air trapping present, DLCO moderately reduced, overall raw values  stable and percentages improved 2019 compared to 2020  WALK:     09/18/2023   11:52 AM 08/26/2023    2:55 PM 08/26/2023    2:54 PM  SIX MIN WALK  Supplimental Oxygen  during Test? (L/min) No Yes Yes  O2 Flow Rate  3 L/min 2 L/min  Type  Pulse Pulse  Tech Comments: patient could  not maintain O2 , dropped to 83% placed on 3L POC, O2 at 95 % kepy dropping put in comments the the liters of oxygen ,only maintained around 88-89,after at rest on 4L maintained at 92%     Imaging: Personally reviewed and as per EMR and discussion in this note No results found.  Lab Results: Personally reviewed CBC    Component Value Date/Time   WBC 5.8 08/16/2023 0326   RBC 4.51 08/16/2023 0326   HGB 13.1 08/16/2023 0326   HCT 39.8 08/16/2023 0326   PLT 162 08/16/2023 0326   MCV 88.2 08/16/2023 0326   MCH 29.0 08/16/2023 0326   MCHC 32.9 08/16/2023 0326   RDW 13.3 08/16/2023 0326   LYMPHSABS 0.6 (L) 08/14/2023 0354   MONOABS 0.3 08/14/2023 0354   EOSABS 0.0 08/14/2023 0354   BASOSABS 0.0 08/14/2023 0354    BMET     Component Value Date/Time   NA 130 (L) 08/16/2023 0326   K 3.8 08/16/2023 0326   CL 93 (L) 08/16/2023 0326   CO2 31 08/16/2023 0326   GLUCOSE 96 08/16/2023 0326   BUN 24 (H) 08/16/2023 0326   CREATININE 0.73 08/16/2023 0326   CALCIUM 8.6 (L) 08/16/2023 0326   GFRNONAA >60 08/16/2023 0326    BNP    Component Value Date/Time   BNP 166.3 (H) 08/13/2023 1331    ProBNP No results found for: PROBNP  Specialty Problems       Pulmonary Problems   Exertional dyspnea - as anginal equivalent   Bronchiectasis without complication (HCC)   COPD (chronic obstructive pulmonary disease) (HCC)   COPD exacerbation (HCC)   Acute respiratory failure with hypoxia (HCC)    No Known Allergies  Immunization History  Administered Date(s) Administered   Fluad Quad(high Dose 65+) 12/29/2018   Fluzone Influenza virus vaccine,trivalent (IIV3), split virus 01/13/2007, 12/12/2010, 01/01/2012, 12/26/2012, 03/19/2013   INFLUENZA, HIGH DOSE SEASONAL PF 12/01/2019, 12/23/2021   Influenza Inj Mdck Quad Pf 12/10/2016, 12/16/2017   Influenza, Mdck, Trivalent,PF 6+ MOS(egg free) 12/17/2015, 12/10/2016   Influenza, Quadrivalent, Recombinant, Inj, Pf 12/16/2017, 12/19/2018, 12/01/2019, 12/17/2020, 12/29/2022, 12/21/2023   Influenza,inj,Quad PF,6+ Mos 12/26/2012, 12/22/2013, 12/25/2014   Influenza-Unspecified 12/16/2016   Moderna SARS-COV2 Booster Vaccination 11/26/2022   PFIZER(Purple Top)SARS-COV-2 Vaccination 04/03/2019, 04/24/2019, 12/14/2019   PNEUMOCOCCAL CONJUGATE-20 09/03/2022   Pneumococcal Conjugate-13 04/22/2013   Pneumococcal Polysaccharide-23 12/10/2005, 04/22/2013, 05/04/2015, 09/03/2022   Respiratory Syncytial Virus Vaccine,Recomb Aduvanted(Arexvy) 03/25/2023   Td (Adult) 12/24/2018   Td (Adult),5 Lf Tetanus Toxid, Preservative Free 01/13/2007   Zoster, Live 01/26/2008    Past Medical History:  Diagnosis Date   Depression    Hypercholesteremia    Hypertension    Long-term use of  high-risk medication 03/07/2020    Tobacco History: Social History   Tobacco Use  Smoking Status Never   Passive exposure: Past  Smokeless Tobacco Never   Counseling given: Not Answered   Continue to not smoke  Outpatient Encounter Medications as of 12/30/2023  Medication Sig   albuterol  (PROVENTIL ) (2.5 MG/3ML) 0.083% nebulizer solution USE 1 VIAL VIA NEBULIZER EVERY 6 HOURS AS NEEDED FOR WHEEZING OR SHORTNESS OF BREATH  albuterol  (VENTOLIN  HFA) 108 (90 Base) MCG/ACT inhaler TAKE 2 PUFFS BY MOUTH EVERY 6 HOURS AS NEEDED FOR WHEEZE OR SHORTNESS OF BREATH (Patient taking differently: Inhale 2 puffs into the lungs every 6 (six) hours as needed for wheezing or shortness of breath.)   amoxicillin -clavulanate (AUGMENTIN ) 875-125 MG tablet Take 1 tablet by mouth 2 (two) times daily for 10 days.   CEQUA  0.09 % SOLN Place 1 drop into both eyes in the morning and at bedtime.   Cholecalciferol (VITAMIN D3 PO) Take 1 capsule by mouth daily.   guaiFENesin  (MUCINEX ) 600 MG 12 hr tablet Take 2 tablets (1,200 mg total) by mouth 2 (two) times daily.   hydroxychloroquine  (PLAQUENIL ) 200 MG tablet Take 200 mg by mouth 2 (two) times daily.   levothyroxine  (SYNTHROID ) 137 MCG tablet Take 137 mcg by mouth daily before breakfast.   omeprazole (PRILOSEC) 20 MG capsule Take 20 mg by mouth daily before breakfast.   promethazine -dextromethorphan (PROMETHAZINE -DM) 6.25-15 MG/5ML syrup Take 5 mLs by mouth 4 (four) times daily as needed for cough.   ramipril  (ALTACE ) 10 MG capsule Take 10 mg by mouth daily.   sertraline  (ZOLOFT ) 50 MG tablet Take 50 mg by mouth daily.   sodium chloride  HYPERTONIC 3 % nebulizer solution Take by nebulization as needed for other. 4 cc nebulized twice a day   TYLENOL  500 MG tablet Take 500-1,000 mg by mouth every 6 (six) hours as needed for mild pain (pain score 1-3) or headache.   umeclidinium-vilanterol (ANORO ELLIPTA ) 62.5-25 MCG/ACT AEPB TAKE 1 PUFF BY MOUTH EVERY DAY    amLODipine  (NORVASC ) 10 MG tablet Take 1 tablet (10 mg total) by mouth daily. (Patient not taking: Reported on 12/30/2023)   No facility-administered encounter medications on file as of 12/30/2023.     Review of Systems  Review of Systems  N/a  Physical Exam  BP 139/82   Pulse 87   Ht 5' 3 (1.6 m)   Wt 151 lb (68.5 kg)   SpO2 96%   BMI 26.75 kg/m   Wt Readings from Last 5 Encounters:  12/30/23 151 lb (68.5 kg)  09/18/23 157 lb (71.2 kg)  08/26/23 156 lb 3.2 oz (70.9 kg)  08/16/23 157 lb 6.5 oz (71.4 kg)  05/29/23 165 lb (74.8 kg)    BMI Readings from Last 5 Encounters:  12/30/23 26.75 kg/m  09/18/23 27.81 kg/m  08/26/23 27.67 kg/m  08/16/23 27.88 kg/m  05/29/23 29.23 kg/m     Physical Exam General: Sitting in chair provide Eyes: EOMI, no icterus Neck: Supple, no JVP Pulmonary: Clear, no work of breathing Cardiovascular: Regular rate and rhythm Abdomen: Nondistended MSK: No synovitis, no joint effusion Neuro: Normal gait, no weakness Psych: Normal mood, full affect   Assessment & Plan:   Bronchiectasis with acute exacerbation: Complicated by NTM colonization longstanding reflux.  Pulmonary bibasilar.  Likely silent aspiration.  Increased cough over the last 2 weeks without any worsening dyspnea.  Augmentin  prescribed.  Encouraged ongoing airway clearance with flutter valve.  Acute hypoxemic respiratory failure: Following recent hospitalization for pneumonia.  Chest x-ray largely clear.  Desaturates with exertion.  At rest things better.  Encouraged to keep oxygen  saturation 90% with or without oxygen  at home.  Nocturnal hypoxemia: Documented in the hospital when acutely ill.  Not using oxygen  at night currently.  Discussed overnight oximetry test to evaluate, she declines at this time.  COPD asthma overlap: continue Anoro.   Return in about 6 months (around 06/29/2024) for f/u Dr. Annella.  Donnice JONELLE Beals, MD 12/30/2023

## 2023-12-30 NOTE — Patient Instructions (Signed)
 Nice to see you again  Continue your inhalers  Take Augmentin  1 tablet in the morning 1 tablet in the evening, twice a day, for 10 days  Try to eat yogurt and/or take a probiotic while taking the antibiotic to try to prevent diarrhea that can come with antibiotics  Continue using oxygen , try to keep it at 90% or higher.  Okay to experiment to find the lowest amount of oxygen  you need to do so.  Return to clinic in 6 months or sooner as needed with Dr. Annella

## 2024-01-01 DIAGNOSIS — J449 Chronic obstructive pulmonary disease, unspecified: Secondary | ICD-10-CM | POA: Diagnosis not present

## 2024-01-01 DIAGNOSIS — B372 Candidiasis of skin and nail: Secondary | ICD-10-CM | POA: Diagnosis not present

## 2024-01-01 DIAGNOSIS — J9621 Acute and chronic respiratory failure with hypoxia: Secondary | ICD-10-CM | POA: Diagnosis not present

## 2024-01-08 ENCOUNTER — Ambulatory Visit

## 2024-01-11 DIAGNOSIS — F32A Depression, unspecified: Secondary | ICD-10-CM | POA: Diagnosis not present

## 2024-01-11 DIAGNOSIS — E871 Hypo-osmolality and hyponatremia: Secondary | ICD-10-CM | POA: Diagnosis not present

## 2024-01-11 DIAGNOSIS — K219 Gastro-esophageal reflux disease without esophagitis: Secondary | ICD-10-CM | POA: Diagnosis not present

## 2024-01-11 DIAGNOSIS — J9601 Acute respiratory failure with hypoxia: Secondary | ICD-10-CM | POA: Diagnosis not present

## 2024-01-16 DIAGNOSIS — E871 Hypo-osmolality and hyponatremia: Secondary | ICD-10-CM | POA: Diagnosis not present

## 2024-01-16 DIAGNOSIS — J9601 Acute respiratory failure with hypoxia: Secondary | ICD-10-CM | POA: Diagnosis not present

## 2024-01-16 DIAGNOSIS — F32A Depression, unspecified: Secondary | ICD-10-CM | POA: Diagnosis not present

## 2024-01-16 DIAGNOSIS — K219 Gastro-esophageal reflux disease without esophagitis: Secondary | ICD-10-CM | POA: Diagnosis not present

## 2024-01-27 ENCOUNTER — Ambulatory Visit
Admission: RE | Admit: 2024-01-27 | Discharge: 2024-01-27 | Disposition: A | Discharge status: Home / Self Care | Source: Ambulatory Visit | Attending: Internal Medicine | Admitting: Internal Medicine

## 2024-01-27 DIAGNOSIS — Z1231 Encounter for screening mammogram for malignant neoplasm of breast: Secondary | ICD-10-CM

## 2024-02-10 ENCOUNTER — Ambulatory Visit: Payer: Self-pay | Admitting: Pulmonary Disease

## 2024-02-10 NOTE — Telephone Encounter (Signed)
 FYI Only or Action Required?: Action required by provider: Medications to be called in  - nearly out of cough medication. Cough is near constant - no appts available today .  Patient is followed in Pulmonology for , last seen on 12/30/2023 by Hunsucker, Aimee SAUNDERS, MD.  Called Nurse Triage reporting Cough.  Symptoms began several days ago.  Interventions attempted: Nebulizer, cough medication, rescue inhaler, maintenance inhaler and O2.  Symptoms are: gradually worsening.  Triage Disposition: See HCP Within 4 Hours (Or PCP Triage)  Patient/caregiver understands and will follow disposition?: Yes                  E2C2 Pulmonary Triage - Initial Assessment Questions "Chief Complaint (e.g., cough, sob, wheezing, fever, chills, sweat or additional symptoms) *Go to specific symptom protocol after initial questions. Cough - yellow green phlegm  "How long have symptoms been present?" Since Friday  Have you tested for COVID or Flu? Note: If not, ask patient if a home test can be taken. If so, instruct patient to call back for positive results. No  MEDICINES:   "Have you used any OTC meds to help with symptoms?" No If yes, ask "What medications?" Promethazine   "Have you used your inhalers/maintenance medication?" Yes If yes, "What medications?" Anora - nebulizer  If inhaler, ask "How many puffs and how often?" Note: Review instructions on medication in the chart. As directed  OXYGEN : "Do you wear supplemental oxygen ?" Yes If yes, "How many liters are you supposed to use?" 2L  "Do you monitor your oxygen  levels?" Yes If yes, What is your reading (oxygen  level) today? 93-94 HR 83  What is your usual oxygen  saturation reading?  (Note: Pulmonary O2 sats should be 90% or greater) 90-93               Copied from CRM #[id number]. Topic: Clinical - Red Word Triage >> Feb 10, 2024 11:34 AM [name] wrote: Red Word that prompted transfer to Nurse Triage:  Deep cough with yellow mucus since Friday. Reason for Disposition  [1] MILD difficulty breathing (e.g., minimal/no SOB at rest, SOB with walking, pulse < 100) AND [2] still present when not coughing  Answer Assessment - Initial Assessment Questions 1. ONSET: When did the cough begin?      Friday 2. SEVERITY: How bad is the cough today?      Near constant 3. SPUTUM: Describe the color of your sputum (e.g., none, dry cough; clear, white, yellow, green)     Green yellow  5. DIFFICULTY BREATHING: Are you having difficulty breathing? If Yes, ask: How bad is it? (e.g., mild, moderate, severe)      mild 6. FEVER: Do you have a fever? If Yes, ask: What is your temperature, how was it measured, and when did it start?     no 7. CARDIAC HISTORY: Do you have any history of heart disease? (e.g., heart attack, congestive heart failure)       8. LUNG HISTORY: Do you have any history of lung disease?  (e.g., pulmonary embolus, asthma, emphysema)     yes  10. OTHER SYMPTOMS: Do you have any other symptoms? (e.g., runny nose, wheezing, chest pain)       no  Protocols used: Cough - Acute Productive-A-AH

## 2024-02-10 NOTE — Telephone Encounter (Signed)
 Pt has been scheduled with Candis, [place].  Nothing further needed.

## 2024-02-10 NOTE — Telephone Encounter (Signed)
 Dr. Annella is off, routing to [facility] to please advise.

## 2024-02-10 NOTE — Telephone Encounter (Signed)
 Copied from CRM [id number]. Topic: Clinical - Red Word Triage >> Feb 10, 2024 11:34 AM [name] wrote: Red Word that prompted transfer to Nurse Triage: Deep cough with yellow mucus since Friday. >> Feb 10, 2024  2:26 PM Leila BROCKS wrote: Patient [phone] states spoke with NT this morning and was advised if she has not heard from NT after 2 pm, to call back. Per NT, contact CAL. Per CAL, warm transferred to CMA.  DONE.

## 2024-02-10 NOTE — Telephone Encounter (Signed)
 Please schedule apt with Candis tomorrow

## 2024-02-11 ENCOUNTER — Ambulatory Visit (HOSPITAL_BASED_OUTPATIENT_CLINIC_OR_DEPARTMENT_OTHER)

## 2024-02-11 ENCOUNTER — Encounter (HOSPITAL_BASED_OUTPATIENT_CLINIC_OR_DEPARTMENT_OTHER): Payer: Self-pay

## 2024-02-11 ENCOUNTER — Other Ambulatory Visit (HOSPITAL_BASED_OUTPATIENT_CLINIC_OR_DEPARTMENT_OTHER): Payer: Self-pay

## 2024-02-11 VITALS — BP 153/78 | HR 79 | Ht 63.0 in | Wt 156.0 lb

## 2024-02-11 DIAGNOSIS — J9601 Acute respiratory failure with hypoxia: Secondary | ICD-10-CM | POA: Diagnosis not present

## 2024-02-11 DIAGNOSIS — F32A Depression, unspecified: Secondary | ICD-10-CM | POA: Diagnosis not present

## 2024-02-11 DIAGNOSIS — R053 Chronic cough: Secondary | ICD-10-CM

## 2024-02-11 DIAGNOSIS — K219 Gastro-esophageal reflux disease without esophagitis: Secondary | ICD-10-CM | POA: Diagnosis not present

## 2024-02-11 DIAGNOSIS — J471 Bronchiectasis with (acute) exacerbation: Secondary | ICD-10-CM | POA: Diagnosis not present

## 2024-02-11 DIAGNOSIS — J4489 Other specified chronic obstructive pulmonary disease: Secondary | ICD-10-CM

## 2024-02-11 MED ORDER — PROMETHAZINE-DM 6.25-15 MG/5ML PO SYRP: 5.0000 mL | ORAL_SOLUTION | Freq: Four times a day (QID) | ORAL | 0 refills | Status: AC | PRN

## 2024-02-11 MED ORDER — UMECLIDINIUM-VILANTEROL 62.5-25 MCG/ACT IN AEPB: 1.0000 | INHALATION_SPRAY | Freq: Every day | RESPIRATORY_TRACT | 5 refills | Status: DC

## 2024-02-11 MED ORDER — AMOXICILLIN-POT CLAVULANATE 875-125 MG PO TABS: 1.0000 | ORAL_TABLET | Freq: Two times a day (BID) | ORAL | 0 refills | Status: AC

## 2024-02-11 NOTE — Telephone Encounter (Signed)
 Can we check to see what is on pts formulary comparable to this?

## 2024-02-11 NOTE — Patient Instructions (Signed)
-   Prescribed Augmentin  for one week. - Advised to call if not improving after at least three days of antibiotics. - Recommended yogurt or lactobacillus tablets to mitigate GI side effects of Augmentin . - Continue Anoro Ellipta  once daily. - Use albuterol  as needed for respiratory distress. -  Refilled prior Rx for cough medicine.  Keep follow up as scheduled in April; may return sooner if new or worsening complaints.

## 2024-02-11 NOTE — Progress Notes (Signed)
 @Patient  ID: Aimee Brown, female    DOB: 08-Dec-1940, 83 y.o.   MRN: [id number]  Chief Complaint  Patient presents with   Acute Visit   Shortness of Breath   Cough    Referring provider: Shepard Ade, MD  HPI: Discussed the use of AI scribe software for clinical note transcription with the patient, who gave verbal consent to proceed.  History of Present Illness Aimee Brown is an 83 year old female with PMH of bronchiectasis who presents with a deep cough and green sputum production. She is accompanied by her husband, Ehrenfeld.  She has been experiencing a deep cough with green sputum production primarily today, although she has been feeling unwell since last Friday. She notes a deep cough with heaviness in her chest due to congestion. No fevers, chills, or chest pain, but she experiences shortness of breath that requires her to stop and rest before continuing activities. This level of shortness of breath has been consistent for about six years.  She has a history of pneumonia, which occurred in May. She is currently using Anoro Ellipta  once in the morning and occasionally uses albuterol  as a rescue inhaler. She mentions needing a refill for her Anoro Ellipta . She has been using promethazine  D for her cough since the weekend but has run out. She is not currently taking Mucinex .  No swelling in her legs and no known medication allergies. She maintains her appetite and continues to eat and drink fluids, although her appetite is not as good as usual.  Last OV 12/30/2023: 83 y.o. woman with history of bronchiectasis and likely asthma whom we are seeing for evaluation of the same.  Multiple telephone encounter notes from our office and interim reviewed.   Returns for routine follow-up.  Overall doing well.  No exacerbations since last visit.  Continues her oxygen  POC 2 L.  Keeps oxygen  saturation up when she exerts result.  At rest she keeps her oxygen  off and it stayed above 90.  She is  not wearing oxygen  at night.  She does not like it.  We discussed overnight oximetry but she is not interested in using at night, it is uncomfortable and she does not like it.  Breathing overall stable.  Increased cough over the last 2 weeks.  Increased congestion productive cough.  No worsening dyspnea etc.  Discussed likely bronchiectasis exacerbation.    Immunization History  Administered Date(s) Administered   Fluad Quad(high Dose 65+) 12/29/2018   Fluzone Influenza virus vaccine,trivalent (IIV3), split virus 01/13/2007, 12/12/2010, 01/01/2012, 12/26/2012, 03/19/2013   INFLUENZA, HIGH DOSE SEASONAL PF 12/01/2019, 12/23/2021   Influenza Inj Mdck Quad Pf 12/10/2016, 12/16/2017   Influenza, Mdck, Trivalent,PF 6+ MOS(egg free) 12/17/2015, 12/10/2016   Influenza, Quadrivalent, Recombinant, Inj, Pf 12/16/2017, 12/19/2018, 12/01/2019, 12/17/2020, 12/29/2022, 12/21/2023   Influenza,inj,Quad PF,6+ Mos 12/26/2012, 12/22/2013, 12/25/2014   Influenza-Unspecified 12/16/2016   Moderna SARS-COV2 Booster Vaccination 11/26/2022   PFIZER(Purple Top)SARS-COV-2 Vaccination 04/03/2019, 04/24/2019, 12/14/2019   PNEUMOCOCCAL CONJUGATE-20 09/03/2022   Pneumococcal Conjugate-13 04/22/2013   Pneumococcal Polysaccharide-23 12/10/2005, 04/22/2013, 05/04/2015, 09/03/2022   Respiratory Syncytial Virus Vaccine,Recomb Aduvanted(Arexvy) 03/25/2023   Td (Adult) 12/24/2018   Td (Adult),5 Lf Tetanus Toxid, Preservative Free 01/13/2007   Zoster, Live 01/26/2008    Past Medical History:  Diagnosis Date   Depression    Hypercholesteremia    Hypertension    Long-term use of high-risk medication 03/07/2020    Tobacco History: Social History   Tobacco Use  Smoking Status Never   Passive exposure:  Past  Smokeless Tobacco Never   Counseling given: Not Answered   Outpatient Medications Prior to Visit  Medication Sig Dispense Refill   albuterol  (PROVENTIL ) (2.5 MG/3ML) 0.083% nebulizer solution USE 1 VIAL VIA  NEBULIZER EVERY 6 HOURS AS NEEDED FOR WHEEZING OR SHORTNESS OF BREATH 450 mL 5   albuterol  (VENTOLIN  HFA) 108 (90 Base) MCG/ACT inhaler TAKE 2 PUFFS BY MOUTH EVERY 6 HOURS AS NEEDED FOR WHEEZE OR SHORTNESS OF BREATH (Patient taking differently: Inhale 2 puffs into the lungs every 6 (six) hours as needed for wheezing or shortness of breath.) 6.7 each 6   amLODipine  (NORVASC ) 10 MG tablet Take 1 tablet (10 mg total) by mouth daily. (Patient not taking: Reported on 12/30/2023) 30 tablet 0   CEQUA  0.09 % SOLN Place 1 drop into both eyes in the morning and at bedtime.     Cholecalciferol (VITAMIN D3 PO) Take 1 capsule by mouth daily.     guaiFENesin  (MUCINEX ) 600 MG 12 hr tablet Take 2 tablets (1,200 mg total) by mouth 2 (two) times daily. 20 tablet 0   hydroxychloroquine  (PLAQUENIL ) 200 MG tablet Take 200 mg by mouth 2 (two) times daily.     levothyroxine  (SYNTHROID ) 137 MCG tablet Take 137 mcg by mouth daily before breakfast.     omeprazole (PRILOSEC) 20 MG capsule Take 20 mg by mouth daily before breakfast.  1   ramipril  (ALTACE ) 10 MG capsule Take 10 mg by mouth daily.  3   sertraline  (ZOLOFT ) 50 MG tablet Take 50 mg by mouth daily.     sodium chloride  HYPERTONIC 3 % nebulizer solution Take by nebulization as needed for other. 4 cc nebulized twice a day 250 mL 3   TYLENOL  500 MG tablet Take 500-1,000 mg by mouth every 6 (six) hours as needed for mild pain (pain score 1-3) or headache.     promethazine -dextromethorphan (PROMETHAZINE -DM) 6.25-15 MG/5ML syrup Take 5 mLs by mouth 4 (four) times daily as needed for cough. 240 mL 0   umeclidinium-vilanterol (ANORO ELLIPTA ) 62.5-25 MCG/ACT AEPB TAKE 1 PUFF BY MOUTH EVERY DAY 60 each 5   No facility-administered medications prior to visit.     Review of Systems: as per hpi  Constitutional:   No  weight loss, night sweats,  Fevers, chills, fatigue, or  lassitude.  HEENT:   No headaches,  Difficulty swallowing,  Tooth/dental problems, or  Sore throat,                 No sneezing, itching, ear ache, nasal congestion, post nasal drip,   CV:  No chest pain,  Orthopnea, PND, swelling in lower extremities, anasarca, dizziness, palpitations, syncope.   GI  No heartburn, indigestion, abdominal pain, nausea, vomiting, diarrhea, change in bowel habits, loss of appetite, bloody stools.   Resp: No shortness of breath with exertion or at rest.  No excess mucus, no productive cough,  No non-productive cough,  No coughing up of blood.  No change in color of mucus.  No wheezing.  No chest wall deformity  Skin: no rash or lesions.  GU: no dysuria, change in color of urine, no urgency or frequency.  No flank pain, no hematuria   MS:  No joint pain or swelling.  No decreased range of motion.  No back pain.    Physical Exam  BP (!) 153/78   Pulse 79   Ht 5' 3 (1.6 m)   Wt 156 lb (70.8 kg)   SpO2 97% Comment: 2 LITERS  BMI  27.63 kg/m   GEN: A/Ox3; pleasant , NAD, well nourished    HEENT:  [place]/AT,  EACs-clear, TMs-wnl, NOSE-clear, THROAT-clear, no lesions, no postnasal drip or exudate noted.   NECK:  Supple w/ fair ROM; no JVD; normal carotid impulses w/o bruits; no thyromegaly or nodules palpated; no lymphadenopathy.    RESP  Coarse throughout but clears with cough  P & A; w/o, wheezes/ rales/ or rhonchi. no accessory muscle use, no dullness to percussion  CARD:  RRR, no m/r/g, no peripheral edema, pulses intact, no cyanosis or clubbing.  GI:   Soft & nt; nml bowel sounds; no organomegaly or masses detected.   Musco: Warm bil, no deformities or joint swelling noted.   Neuro: alert, no focal deficits noted.    Skin: Warm, no lesions or rashes    Lab Results:  CBC    Component Value Date/Time   WBC 5.8 08/16/2023 0326   RBC 4.51 08/16/2023 0326   HGB 13.1 08/16/2023 0326   HCT 39.8 08/16/2023 0326   PLT 162 08/16/2023 0326   MCV 88.2 08/16/2023 0326   MCH 29.0 08/16/2023 0326   MCHC 32.9 08/16/2023 0326   RDW 13.3 08/16/2023  0326   LYMPHSABS 0.6 (L) 08/14/2023 0354   MONOABS 0.3 08/14/2023 0354   EOSABS 0.0 08/14/2023 0354   BASOSABS 0.0 08/14/2023 0354    BMET    Component Value Date/Time   NA 130 (L) 08/16/2023 0326   K 3.8 08/16/2023 0326   CL 93 (L) 08/16/2023 0326   CO2 31 08/16/2023 0326   GLUCOSE 96 08/16/2023 0326   BUN 24 (H) 08/16/2023 0326   CREATININE 0.73 08/16/2023 0326   CALCIUM 8.6 (L) 08/16/2023 0326   GFRNONAA >60 08/16/2023 0326    BNP    Component Value Date/Time   BNP 166.3 (H) 08/13/2023 1331    ProBNP No results found for: PROBNP  Imaging: MM 3D SCREENING MAMMOGRAM BILATERAL BREAST Result Date: 01/29/2024 CLINICAL DATA:  Screening. EXAM: DIGITAL SCREENING BILATERAL MAMMOGRAM WITH TOMOSYNTHESIS AND CAD TECHNIQUE: Bilateral screening digital craniocaudal and mediolateral oblique mammograms were obtained. Bilateral screening digital breast tomosynthesis was performed. The images were evaluated with computer-aided detection. COMPARISON:  Previous exam(s). ACR Breast Density Category b: There are scattered areas of fibroglandular density. FINDINGS: There are no findings suspicious for malignancy. IMPRESSION: No mammographic evidence of malignancy. A result letter of this screening mammogram will be mailed directly to the patient. RECOMMENDATION: Screening mammogram in one year. (Code:SM-B-01Y) BI-RADS CATEGORY  1: Negative. Electronically Signed   By: Curtistine Noble   On: 01/29/2024 08:36    Administration History     None          Latest Ref Rng & Units 04/17/2023    9:52 AM 08/19/2017    3:43 PM  PFT Results  FVC-Pre L 1.32  1.33   FVC-Predicted Pre % 54  48   FVC-Post L 1.42  1.45   FVC-Predicted Post % 59  52   Pre FEV1/FVC % % 64  72   Post FEV1/FCV % % 67  69   FEV1-Pre L 0.85  0.96   FEV1-Predicted Pre % 47  46   FEV1-Post L 0.96  1.00   DLCO uncorrected ml/min/mmHg 11.65  13.16   DLCO UNC% % 64  54   DLCO corrected ml/min/mmHg 11.65    DLCO COR  %Predicted % 64    DLVA Predicted % 124  100   TLC L 4.22  5.47  TLC % Predicted % 86  108   RV % Predicted % 120  177     No results found for: NITRICOXIDE   Assessment & Plan:   Assessment & Plan Bronchiectasis with acute exacerbation (HCC)  Asthma-COPD overlap syndrome (HCC)  Chronic cough   Assessment and Plan Assessment & Plan Acute bronchitis in patient with chronic obstructive pulmonary disease and bronchiectasis Acute bronchitis with productive cough and green sputum, viral vs. bacterial. Lungs coarse but improve with coughing. Differential includes viral bronchitis, but bacterial infection suspected due to green sputum in the setting of bronchiectasis. No significant change in shortness of breath compared to baseline. - Prescribed Augmentin  for one week. - Advised to call if not improving after at least three days of antibiotics. - Recommended yogurt or lactobacillus tablets to mitigate GI side effects of Augmentin . - Continue Anoro Ellipta  once daily. - Use albuterol  as needed for respiratory distress. -  Refilled prior Rx for cough medicine.  Chronic obstructive pulmonary disease and bronchiectasis Chronic obstructive pulmonary disease and bronchiectasis with well-managed baseline shortness of breath. No recent increase in albuterol  use. - Continue Anoro Ellipta  once daily. - Use albuterol  as needed for respiratory distress.   Return for scheduled for April with Dr. Annella.  Candis Dandy, PA-C 02/11/2024

## 2024-02-12 ENCOUNTER — Other Ambulatory Visit (HOSPITAL_COMMUNITY): Payer: Self-pay

## 2024-02-12 ENCOUNTER — Other Ambulatory Visit: Payer: Self-pay | Admitting: Pulmonary Disease

## 2024-02-12 DIAGNOSIS — J449 Chronic obstructive pulmonary disease, unspecified: Secondary | ICD-10-CM

## 2024-02-12 DIAGNOSIS — J479 Bronchiectasis, uncomplicated: Secondary | ICD-10-CM

## 2024-02-12 NOTE — Telephone Encounter (Signed)
 Copied from CRM [id number]. Topic: Clinical - Medication Refill >> Feb 12, 2024 11:08 AM Devaughn RAMAN wrote: Medication: albuterol  (PROVENTIL ) (2.5 MG/3ML) 0.083% nebulizer solution  Has the patient contacted their pharmacy? Yes (Agent: If no, request that the patient contact the pharmacy for the refill. If patient does not wish to contact the pharmacy document the reason why and proceed with request.) (Agent: If yes, when and what did the pharmacy advise?)  This is the patient's preferred pharmacy:  [id number] [address], [place] - [address] [place] [address] Phone: [phone] Fax: [phone]  Is this the correct pharmacy for this prescription? Yes If no, delete pharmacy and type the correct one.   Has the prescription been filled recently? No  Is the patient out of the medication? No, 4 days left  Has the patient been seen for an appointment in the last year OR does the patient have an upcoming appointment? Yes  Can we respond through MyChart? No  Agent: Please be advised that Rx refills may take up to 3 business days. We ask that you follow-up with your pharmacy.

## 2024-02-16 DIAGNOSIS — J9601 Acute respiratory failure with hypoxia: Secondary | ICD-10-CM | POA: Diagnosis not present

## 2024-02-16 DIAGNOSIS — F32A Depression, unspecified: Secondary | ICD-10-CM | POA: Diagnosis not present

## 2024-02-16 DIAGNOSIS — K219 Gastro-esophageal reflux disease without esophagitis: Secondary | ICD-10-CM | POA: Diagnosis not present

## 2024-02-16 DIAGNOSIS — E871 Hypo-osmolality and hyponatremia: Secondary | ICD-10-CM | POA: Diagnosis not present

## 2024-02-17 MED ORDER — ALBUTEROL SULFATE (2.5 MG/3ML) 0.083% IN NEBU: 2.5000 mg | INHALATION_SOLUTION | RESPIRATORY_TRACT | 5 refills | Status: AC | PRN

## 2024-02-24 DIAGNOSIS — L814 Other melanin hyperpigmentation: Secondary | ICD-10-CM | POA: Diagnosis not present

## 2024-02-24 DIAGNOSIS — D492 Neoplasm of unspecified behavior of bone, soft tissue, and skin: Secondary | ICD-10-CM | POA: Diagnosis not present

## 2024-02-24 DIAGNOSIS — L821 Other seborrheic keratosis: Secondary | ICD-10-CM | POA: Diagnosis not present

## 2024-02-24 DIAGNOSIS — Z85828 Personal history of other malignant neoplasm of skin: Secondary | ICD-10-CM | POA: Diagnosis not present

## 2024-02-24 DIAGNOSIS — Z08 Encounter for follow-up examination after completed treatment for malignant neoplasm: Secondary | ICD-10-CM | POA: Diagnosis not present

## 2024-02-24 DIAGNOSIS — L570 Actinic keratosis: Secondary | ICD-10-CM | POA: Diagnosis not present

## 2024-02-24 DIAGNOSIS — D225 Melanocytic nevi of trunk: Secondary | ICD-10-CM | POA: Diagnosis not present

## 2024-02-25 ENCOUNTER — Other Ambulatory Visit (HOSPITAL_BASED_OUTPATIENT_CLINIC_OR_DEPARTMENT_OTHER): Payer: Self-pay

## 2024-02-25 ENCOUNTER — Ambulatory Visit: Payer: Self-pay | Admitting: Pulmonary Disease

## 2024-02-25 MED ORDER — LEVOFLOXACIN 500 MG PO TABS: 500.0000 mg | ORAL_TABLET | Freq: Every day | ORAL | 0 refills | Status: AC

## 2024-02-25 NOTE — Telephone Encounter (Signed)
 FYI Only or Action Required?: Action required by provider: clinical question for provider and update on patient condition.  Patient is followed in Pulmonology for Bronchitis, last seen on 02/11/2024 by Charley Conger, PA-C.  Called Nurse Triage reporting Cough.  Symptoms began several days ago.  Interventions attempted: Rescue inhaler and Nebulizer treatments.  Symptoms are: unchanged.  Triage Disposition: See Physician Within 24 Hours  Patient/caregiver understands and will follow disposition?: Yes         Copied from CRM #[id number]. Topic: Clinical - Red Word Triage >> Feb 25, 2024 11:27 AM Russell PARAS wrote: Red Word that prompted transfer to Nurse Triage:   Pt is having symptoms of congestion that is dark green in color, occasionally turn yellow Severe cough, occasionally causes choking sensation  SOB Cough syrup is not working Requesting additional course of antibiotics.   Pt of [place], but has seen Charley recently for this issue  [facility] [address] Reason for Disposition  [1] Continuous (nonstop) coughing interferes with work or school AND [2] no improvement using cough treatment per Care Advice  Answer Assessment - Initial Assessment Questions 1. ONSET: When did the cough begin?      Since the weekend, a few days    2. SEVERITY: How bad is the cough today?      Constant    3. SPUTUM: Describe the color of your sputum (e.g., none, dry cough; clear, white, yellow, green)     Yellow-green    4. HEMOPTYSIS: Are you coughing up any blood? If Yes, ask: How much? (e.g., flecks, streaks, tablespoons, etc.)      No   5. DIFFICULTY BREATHING: Are you having difficulty breathing? If Yes, ask: How bad is it? (e.g., mild, moderate, severe)      SOB, as normal     6. FEVER: Do you have a fever? If Yes, ask: What is your temperature, how was it measured, and when did it start?      No    7. CARDIAC HISTORY: Do you have any history of  heart disease? (e.g., heart attack, congestive heart failure)      No    8. LUNG HISTORY: Do you have any history of lung disease?  (e.g., pulmonary embolus, asthma, emphysema)   COPD, asthma     10. OTHER SYMPTOMS: Do you have any other symptoms? (e.g., runny nose, wheezing, chest pain)  No   Patient called in to triage with complaints of productive cough  This has been ongoing since this past weekend. The patient stated X 2 weeks ago she was seen in office for cough, she was diagnosed with bronchitis. Some symptoms have improved but over the weekend the symptoms returned. She is now coughing up green-yellow mucus. She states the cough is very deep and constant. The cough has interpreted her sleep. She has SOB, not changed from baseline    For home care, the patient is using her rescue inhaler and nebulizer. Patient declined an appointment stating not much has changed since the last visit, but would like the provider to call in medication for the symptoms.  She agrees with the plan of care, and will reach out if symptoms worsen or persist.  Protocols used: Cough - Acute Productive-A-AH

## 2024-02-25 NOTE — Telephone Encounter (Signed)
 Aimee Brown,  Please advise.  Thank you.

## 2024-02-25 NOTE — Progress Notes (Signed)
 Patient with persistent cough productive of green sputum.  Recently treated with Augmentin  and only brief reprieve of symptoms before green sputum, shortness of breath and cough returned.  Rx sent to pharmacy for Levaquin .  Recent BUN/Creat stable.  Plan for follow up within 4 weeks.

## 2024-02-25 NOTE — Telephone Encounter (Signed)
 Pt.notified

## 2024-03-30 ENCOUNTER — Encounter (HOSPITAL_BASED_OUTPATIENT_CLINIC_OR_DEPARTMENT_OTHER): Payer: Self-pay

## 2024-03-30 ENCOUNTER — Ambulatory Visit (HOSPITAL_BASED_OUTPATIENT_CLINIC_OR_DEPARTMENT_OTHER)

## 2024-03-30 VITALS — BP 174/74 | HR 89 | Ht 63.0 in | Wt 156.0 lb

## 2024-03-30 DIAGNOSIS — R053 Chronic cough: Secondary | ICD-10-CM

## 2024-03-30 DIAGNOSIS — J471 Bronchiectasis with (acute) exacerbation: Secondary | ICD-10-CM | POA: Diagnosis not present

## 2024-03-30 DIAGNOSIS — J4489 Other specified chronic obstructive pulmonary disease: Secondary | ICD-10-CM

## 2024-03-30 MED ORDER — ARFORMOTEROL TARTRATE 15 MCG/2ML IN NEBU: 15.0000 ug | INHALATION_SOLUTION | Freq: Two times a day (BID) | RESPIRATORY_TRACT | 6 refills | Status: AC

## 2024-03-30 NOTE — Progress Notes (Signed)
 "  @Patient  ID: [name], female    DOB: 1940/11/29, 84 y.o.   MRN: [id number]  Chief Complaint  Patient presents with   Follow-up    Referring provider: Shepard Ade, MD  HPI: Discussed the use of AI scribe software for clinical note transcription with the patient, who gave verbal consent to proceed.  History of Present Illness BERNARDINE LANGWORTHY is an 84 year old female with respiratory issues who presents with persistent cough and shortness of breath.  She has a persistent deep cough and increased shortness of breath, particularly with exertion. The cough has improved after completing two courses of antibiotics, initially Augmentin  and then Levaquin . Despite this improvement, she continues to experience a deep cough with clear sputum production, which she describes as thick like 'glue'.  She is currently on oxygen  therapy with no changes in her oxygen  requirements. She has not been using Mucinex  recently, although it is on her medication list. She continues to use Anoro daily and has albuterol  available for use as needed, though she has not used it since her symptoms improved. Previously, she used albuterol  MDI more frequently to manage her cough.  She does use an Albuterol  nebulizer twice daily on a schedule.  She mentions having hypertonic saline available but has not used it recently due to its drying effect, which exacerbates her Sjogren's syndrome symptoms, making it difficult to expectorate mucus. She is using a nebulizer with albuterol  twice a day and has previously used saline with the nebulizer.  Her past medical history includes Sjogren's syndrome, which contributes to the dryness of her secretions. She manages this by consuming fruit to help with oral dryness during meals.  Last OV 02/11/2024: TRENDA CORLISS is an 84 year old female with PMH of bronchiectasis who presents with a deep cough and green sputum production. She is accompanied by her husband, [place].   She has been  experiencing a deep cough with green sputum production primarily today, although she has been feeling unwell since last Friday. She notes a deep cough with heaviness in her chest due to congestion. No fevers, chills, or chest pain, but she experiences shortness of breath that requires her to stop and rest before continuing activities. This level of shortness of breath has been consistent for about six years.   She has a history of pneumonia, which occurred in May. She is currently using Anoro Ellipta  once in the morning and occasionally uses albuterol  as a rescue inhaler. She mentions needing a refill for her Anoro Ellipta . She has been using promethazine  D for her cough since the weekend but has run out. She is not currently taking Mucinex .   No swelling in her legs and no known medication allergies. She maintains her appetite and continues to eat and drink fluids, although her appetite is not as good as usual.    TEST/EVENTS : none new  Allergies[1]  Immunization History  Administered Date(s) Administered   Fluad Quad(high Dose 65+) 12/29/2018   Fluzone Influenza virus vaccine,trivalent (IIV3), split virus 01/13/2007, 12/12/2010, 01/01/2012, 12/26/2012, 03/19/2013   INFLUENZA, HIGH DOSE SEASONAL PF 12/01/2019, 12/23/2021   Influenza Inj Mdck Quad Pf 12/10/2016, 12/16/2017   Influenza, Mdck, Trivalent,PF 6+ MOS(egg free) 12/17/2015, 12/10/2016   Influenza, Quadrivalent, Recombinant, Inj, Pf 12/16/2017, 12/19/2018, 12/01/2019, 12/17/2020, 12/29/2022, 12/21/2023   Influenza,inj,Quad PF,6+ Mos 12/26/2012, 12/22/2013, 12/25/2014   Influenza-Unspecified 12/16/2016   Moderna SARS-COV2 Booster Vaccination 11/26/2022   PFIZER(Purple Top)SARS-COV-2 Vaccination 04/03/2019, 04/24/2019, 12/14/2019   PNEUMOCOCCAL CONJUGATE-20 09/03/2022  Pneumococcal Conjugate-13 04/22/2013   Pneumococcal Polysaccharide-23 12/10/2005, 04/22/2013, 05/04/2015, 09/03/2022   Respiratory Syncytial Virus Vaccine,Recomb  Aduvanted(Arexvy) 03/25/2023   Td (Adult) 12/24/2018   Td (Adult),5 Lf Tetanus Toxid, Preservative Free 01/13/2007   Zoster, Live 01/26/2008    Past Medical History:  Diagnosis Date   Depression    Hypercholesteremia    Hypertension    Long-term use of high-risk medication 03/07/2020    Tobacco History: Tobacco Use History[2] Counseling given: Not Answered   Outpatient Medications Prior to Visit  Medication Sig Dispense Refill   albuterol  (PROVENTIL ) (2.5 MG/3ML) 0.083% nebulizer solution Take 3 mLs (2.5 mg total) by nebulization every 4 (four) hours as needed for wheezing or shortness of breath. 450 mL 5   albuterol  (VENTOLIN  HFA) 108 (90 Base) MCG/ACT inhaler TAKE 2 PUFFS BY MOUTH EVERY 6 HOURS AS NEEDED FOR WHEEZE OR SHORTNESS OF BREATH (Patient taking differently: Inhale 2 puffs into the lungs every 6 (six) hours as needed for wheezing or shortness of breath.) 6.7 each 6   amLODipine  (NORVASC ) 10 MG tablet Take 1 tablet (10 mg total) by mouth daily. 30 tablet 0   CEQUA  0.09 % SOLN Place 1 drop into both eyes in the morning and at bedtime.     Cholecalciferol (VITAMIN D3 PO) Take 1 capsule by mouth daily.     guaiFENesin  (MUCINEX ) 600 MG 12 hr tablet Take 2 tablets (1,200 mg total) by mouth 2 (two) times daily. 20 tablet 0   hydroxychloroquine  (PLAQUENIL ) 200 MG tablet Take 200 mg by mouth 2 (two) times daily.     levofloxacin  (LEVAQUIN ) 500 MG tablet Take 1 tablet (500 mg total) by mouth daily. 7 tablet 0   levothyroxine  (SYNTHROID ) 137 MCG tablet Take 137 mcg by mouth daily before breakfast.     omeprazole (PRILOSEC) 20 MG capsule Take 20 mg by mouth daily before breakfast.  1   promethazine -dextromethorphan (PROMETHAZINE -DM) 6.25-15 MG/5ML syrup Take 5 mLs by mouth 4 (four) times daily as needed for cough. 240 mL 0   ramipril  (ALTACE ) 10 MG capsule Take 10 mg by mouth daily.  3   sertraline  (ZOLOFT ) 50 MG tablet Take 50 mg by mouth daily.     sodium chloride  HYPERTONIC 3 %  nebulizer solution Take by nebulization as needed for other. 4 cc nebulized twice a day 250 mL 3   TYLENOL  500 MG tablet Take 500-1,000 mg by mouth every 6 (six) hours as needed for mild pain (pain score 1-3) or headache.     umeclidinium-vilanterol (ANORO ELLIPTA ) 62.5-25 MCG/ACT AEPB Inhale 1 puff into the lungs daily. 60 each 5   No facility-administered medications prior to visit.     Review of Systems: as per hpi  Constitutional:   No  weight loss, night sweats,  Fevers, chills, fatigue, or  lassitude.  HEENT:   No headaches,  Difficulty swallowing,  Tooth/dental problems, or  Sore throat,                No sneezing, itching, ear ache, nasal congestion, post nasal drip,   CV:  No chest pain,  Orthopnea, PND, swelling in lower extremities, anasarca, dizziness, palpitations, syncope.   GI  No heartburn, indigestion, abdominal pain, nausea, vomiting, diarrhea, change in bowel habits, loss of appetite, bloody stools.   Resp: No shortness of breath with exertion or at rest.  No excess mucus, no productive cough,  No non-productive cough,  No coughing up of blood.  No change in color of mucus.  No  wheezing.  No chest wall deformity  Skin: no rash or lesions.  GU: no dysuria, change in color of urine, no urgency or frequency.  No flank pain, no hematuria   MS:  No joint pain or swelling.  No decreased range of motion.  No back pain.    Physical Exam  BP (!) 174/74   Pulse 89   Ht 5' 3 (1.6 m)   Wt 156 lb (70.8 kg)   BMI 27.63 kg/m   GEN: A/Ox3; pleasant , NAD, well nourished    HEENT:  [place]/AT,  EACs-clear, TMs-wnl, NOSE-clear, THROAT-clear, no lesions, no postnasal drip or exudate noted.   NECK:  Supple w/ fair ROM; no JVD; normal carotid impulses w/o bruits; no thyromegaly or nodules palpated; no lymphadenopathy.    RESP  Clear  P & A; w/o, wheezes/ rales/ or rhonchi. no accessory muscle use, no dullness to percussion  CARD:  RRR, no m/r/g, no peripheral edema, pulses  intact, no cyanosis or clubbing.  GI:   Soft & nt; nml bowel sounds; no organomegaly or masses detected.   Musco: Warm bil, no deformities or joint swelling noted.   Neuro: alert, no focal deficits noted.    Skin: Warm, no lesions or rashes    Lab Results:  CBC    Component Value Date/Time   WBC 5.8 08/16/2023 0326   RBC 4.51 08/16/2023 0326   HGB 13.1 08/16/2023 0326   HCT 39.8 08/16/2023 0326   PLT 162 08/16/2023 0326   MCV 88.2 08/16/2023 0326   MCH 29.0 08/16/2023 0326   MCHC 32.9 08/16/2023 0326   RDW 13.3 08/16/2023 0326   LYMPHSABS 0.6 (L) 08/14/2023 0354   MONOABS 0.3 08/14/2023 0354   EOSABS 0.0 08/14/2023 0354   BASOSABS 0.0 08/14/2023 0354    BMET    Component Value Date/Time   NA 130 (L) 08/16/2023 0326   K 3.8 08/16/2023 0326   CL 93 (L) 08/16/2023 0326   CO2 31 08/16/2023 0326   GLUCOSE 96 08/16/2023 0326   BUN 24 (H) 08/16/2023 0326   CREATININE 0.73 08/16/2023 0326   CALCIUM 8.6 (L) 08/16/2023 0326   GFRNONAA >60 08/16/2023 0326    BNP    Component Value Date/Time   BNP 166.3 (H) 08/13/2023 1331    ProBNP No results found for: PROBNP  Imaging: No results found.  Administration History     None          Latest Ref Rng & Units 04/17/2023    9:52 AM 08/19/2017    3:43 PM  PFT Results  FVC-Pre L 1.32  1.33   FVC-Predicted Pre % 54  48   FVC-Post L 1.42  1.45   FVC-Predicted Post % 59  52   Pre FEV1/FVC % % 64  72   Post FEV1/FCV % % 67  69   FEV1-Pre L 0.85  0.96   FEV1-Predicted Pre % 47  46   FEV1-Post L 0.96  1.00   DLCO uncorrected ml/min/mmHg 11.65  13.16   DLCO UNC% % 64  54   DLCO corrected ml/min/mmHg 11.65    DLCO COR %Predicted % 64    DLVA Predicted % 124  100   TLC L 4.22  5.47   TLC % Predicted % 86  108   RV % Predicted % 120  177     No results found for: NITRICOXIDE   Assessment & Plan:   Assessment & Plan Bronchiectasis with (acute) exacerbation (HCC) -  Resolved; back to baseline Chronic  cough  Asthma-COPD overlap syndrome (HCC)   Assessment and Plan Assessment & Plan Chronic obstructive pulmonary disease Persistent cough with clear, thick sputum but feels back to baseline with respect to symptoms. Previous Augmentin  ineffective, improved after transition to Levaquin . Thick mucus likely worsened due to Sjogren's syndrome. Anoro inhaler may worsen dryness and mucus with LAMA component. - Discontinued Anoro inhaler. - Prescribed Brovana  nebulizer twice daily.  Goal of removing LAMA to help with secretion thickness. - Continue albuterol  nebulizer as needed. - Consider switching back to Anoro if Brovana  is ineffective.  Dependence on supplemental oxygen  Continued oxygen  dependence with stable requirements. Discussed oxygen  equipment maintenance schedule. - Continue current oxygen  therapy regimen.    Return for as scheduled with Dr. Annella in April 2026.  Candis Dandy, PA-C 03/30/2024      [1] No Known Allergies [2]  Social History Tobacco Use  Smoking Status Never   Passive exposure: Past  Smokeless Tobacco Never   "

## 2024-03-30 NOTE — Patient Instructions (Addendum)
 Stop Anoro and scheduled Albuterol  nebulizers twice daily.  Start Brovana  nebulizers twice daily; may use Albuterol  MDI or neb every 4-6 hours as needed for breakthrough shortness of breath or cough.  May also use Mucinex  OTC to help with secretions.  Follow up with Dr. Annella in April 2026 as scheduled; return sooner if new or worsening issues.  If unable to tolerate transition to Brovana , may resume Anoro and albuterol  as per previous schedule.

## 2024-04-08 ENCOUNTER — Telehealth: Payer: Self-pay | Admitting: *Deleted

## 2024-04-08 NOTE — Telephone Encounter (Signed)
 I called [facility] and spoke with tech  She states that they received the rx but it needed a PA  I asked if they needed dx code and she said that no, that was not the issue  Routing to PA team, thanks!

## 2024-04-08 NOTE — Telephone Encounter (Signed)
 Routing to PA team- per [facility], the Brovana  needs PA  They told me that adding dx code to rx would not solve the problem

## 2024-04-08 NOTE — Telephone Encounter (Signed)
 Copied from CRM [id number]. Topic: Clinical - Prescription Issue >> Apr 06, 2024 10:07 AM Corean SAUNDERS wrote: Reason for CRM: Patient states when she saw Candis Dandy on 1/12 the provider ordered her medications to her pharmacy, however the pharmacy states they have not received the prescriptions.   Patient is requesting a call back when this matter is resolved.  -------------------------------------------------------------------------------------------------------------------------------------------  [facility], however, they were closed for lunch.  Will attempt to call back at another time.  arformoterol  (BROVANA ) 15 MCG/2ML NEBU [[id number]]   Order Details Dose: 15 mcg Route: Nebulization Frequency: 2 times daily  Dispense Quantity: 120 mL (30 day supply) Refills: 6   Duration: -- Dispense As Written: No        Sig: Take 2 mLs (15 mcg total) by nebulization 2 (two) times daily.       Start Date: 03/30/24 End Date: --  Written Date: 03/30/24 Expiration Date: 03/30/25  Providers  Authorizing Provider: Dandy Candis, PA-C [address], [place] [address] Phone: [phone]   Fax: [phone] DEA #: [id number]   NPI: [id number] --      Ordering User: Dandy Candis, PA-C      Pharmacy  [facility] [phone] [place], [place] - [address] [place] [address] Phone: [phone]  Fax: [phone] DEA #: [id number]  DAW Reason: --

## 2024-04-10 ENCOUNTER — Telehealth: Payer: Self-pay

## 2024-04-10 ENCOUNTER — Other Ambulatory Visit (HOSPITAL_COMMUNITY): Payer: Self-pay

## 2024-04-10 NOTE — Telephone Encounter (Signed)
 Your request has been approved [id number] Arformoterol  Tartrate 15MCG/2ML IN NEBU Quantity:120  Copay showing $50.93

## 2024-04-10 NOTE — Telephone Encounter (Signed)
*  Pulm  Pharmacy Patient Advocate Encounter   Received notification from Pt Calls Messages that prior authorization for Arformoterol  Tartrate 15MCG/2ML nebulizer solution   is required/requested.   Insurance verification completed.   The patient is insured through [facility] [employer].   Per test claim: PA required; PA submitted to above mentioned insurance via Latent Key/confirmation #/EOC [id number] Status is pending

## 2024-04-13 NOTE — Telephone Encounter (Signed)
 Pt notified she will pick up

## 2024-07-06 ENCOUNTER — Ambulatory Visit: Admitting: Pulmonary Disease
